# Patient Record
Sex: Female | Born: 1970 | Race: Black or African American | Hispanic: No | Marital: Single | Smoking: Never smoker
Health system: Southern US, Community
[De-identification: ages and names within clinical notes are randomized; demographics above are authoritative.]

## PROBLEM LIST (undated history)

## (undated) DIAGNOSIS — Z923 Personal history of irradiation: Secondary | ICD-10-CM

## (undated) DIAGNOSIS — J189 Pneumonia, unspecified organism: Secondary | ICD-10-CM

## (undated) DIAGNOSIS — Z9221 Personal history of antineoplastic chemotherapy: Secondary | ICD-10-CM

## (undated) DIAGNOSIS — C50919 Malignant neoplasm of unspecified site of unspecified female breast: Secondary | ICD-10-CM

## (undated) DIAGNOSIS — F419 Anxiety disorder, unspecified: Secondary | ICD-10-CM

## (undated) HISTORY — DX: Malignant neoplasm of unspecified site of unspecified female breast: C50.919

## (undated) HISTORY — PX: TUBAL LIGATION: SHX77

## (undated) HISTORY — PX: CHOLECYSTECTOMY: SHX55

---

## 2002-04-26 ENCOUNTER — Emergency Department (HOSPITAL_COMMUNITY): Admission: EM | Admit: 2002-04-26 | Discharge: 2002-04-26 | Payer: Self-pay | Admitting: Emergency Medicine

## 2002-04-30 ENCOUNTER — Emergency Department (HOSPITAL_COMMUNITY): Admission: EM | Admit: 2002-04-30 | Discharge: 2002-04-30 | Payer: Self-pay | Admitting: Emergency Medicine

## 2002-11-20 ENCOUNTER — Emergency Department (HOSPITAL_COMMUNITY): Admission: EM | Admit: 2002-11-20 | Discharge: 2002-11-20 | Payer: Self-pay | Admitting: Emergency Medicine

## 2003-07-28 ENCOUNTER — Other Ambulatory Visit: Admission: RE | Admit: 2003-07-28 | Discharge: 2003-07-28 | Payer: Self-pay | Admitting: Family Medicine

## 2012-04-13 ENCOUNTER — Encounter (HOSPITAL_BASED_OUTPATIENT_CLINIC_OR_DEPARTMENT_OTHER): Payer: Self-pay | Admitting: *Deleted

## 2012-04-13 ENCOUNTER — Emergency Department (HOSPITAL_BASED_OUTPATIENT_CLINIC_OR_DEPARTMENT_OTHER)
Admission: EM | Admit: 2012-04-13 | Discharge: 2012-04-13 | Disposition: A | Discharge status: Home / Self Care | Payer: 59 | Attending: Emergency Medicine | Admitting: Emergency Medicine

## 2012-04-13 ENCOUNTER — Emergency Department (HOSPITAL_BASED_OUTPATIENT_CLINIC_OR_DEPARTMENT_OTHER): Payer: 59

## 2012-04-13 DIAGNOSIS — Y9389 Activity, other specified: Secondary | ICD-10-CM | POA: Insufficient documentation

## 2012-04-13 DIAGNOSIS — S6990XA Unspecified injury of unspecified wrist, hand and finger(s), initial encounter: Secondary | ICD-10-CM | POA: Insufficient documentation

## 2012-04-13 DIAGNOSIS — S59909A Unspecified injury of unspecified elbow, initial encounter: Secondary | ICD-10-CM | POA: Insufficient documentation

## 2012-04-13 DIAGNOSIS — S40022A Contusion of left upper arm, initial encounter: Secondary | ICD-10-CM

## 2012-04-13 DIAGNOSIS — Y92410 Unspecified street and highway as the place of occurrence of the external cause: Secondary | ICD-10-CM | POA: Insufficient documentation

## 2012-04-13 DIAGNOSIS — S5010XA Contusion of unspecified forearm, initial encounter: Secondary | ICD-10-CM | POA: Insufficient documentation

## 2012-04-13 MED ORDER — IBUPROFEN 600 MG PO TABS: 600.0000 mg | ORAL_TABLET | Freq: Four times a day (QID) | ORAL | Status: DC | PRN

## 2012-04-13 MED ORDER — METHOCARBAMOL 500 MG PO TABS: 500.0000 mg | ORAL_TABLET | Freq: Two times a day (BID) | ORAL | Status: DC

## 2012-04-13 MED ORDER — TRAMADOL HCL 50 MG PO TABS: 50.0000 mg | ORAL_TABLET | Freq: Four times a day (QID) | ORAL | Status: DC | PRN
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
## 2012-04-26 ENCOUNTER — Encounter (HOSPITAL_BASED_OUTPATIENT_CLINIC_OR_DEPARTMENT_OTHER): Payer: Self-pay

## 2012-04-26 ENCOUNTER — Emergency Department (HOSPITAL_BASED_OUTPATIENT_CLINIC_OR_DEPARTMENT_OTHER)
Admission: EM | Admit: 2012-04-26 | Discharge: 2012-04-26 | Disposition: A | Discharge status: Home / Self Care | Payer: 59 | Attending: Emergency Medicine | Admitting: Emergency Medicine

## 2012-04-26 ENCOUNTER — Emergency Department (HOSPITAL_BASED_OUTPATIENT_CLINIC_OR_DEPARTMENT_OTHER): Payer: 59

## 2012-04-26 DIAGNOSIS — Z79899 Other long term (current) drug therapy: Secondary | ICD-10-CM | POA: Insufficient documentation

## 2012-04-26 DIAGNOSIS — S6990XA Unspecified injury of unspecified wrist, hand and finger(s), initial encounter: Secondary | ICD-10-CM | POA: Insufficient documentation

## 2012-04-26 DIAGNOSIS — Y9289 Other specified places as the place of occurrence of the external cause: Secondary | ICD-10-CM | POA: Insufficient documentation

## 2012-04-26 DIAGNOSIS — M25522 Pain in left elbow: Secondary | ICD-10-CM

## 2012-04-26 DIAGNOSIS — Y9389 Activity, other specified: Secondary | ICD-10-CM | POA: Insufficient documentation

## 2012-04-26 DIAGNOSIS — S59909A Unspecified injury of unspecified elbow, initial encounter: Secondary | ICD-10-CM | POA: Insufficient documentation

## 2012-04-26 MED ORDER — HYDROCODONE-ACETAMINOPHEN 5-325 MG PO TABS: 1.0000 | ORAL_TABLET | ORAL | Status: DC | PRN
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
## 2014-03-11 DIAGNOSIS — C50919 Malignant neoplasm of unspecified site of unspecified female breast: Secondary | ICD-10-CM

## 2014-03-11 HISTORY — DX: Malignant neoplasm of unspecified site of unspecified female breast: C50.919

## 2014-08-23 ENCOUNTER — Telehealth (HOSPITAL_BASED_OUTPATIENT_CLINIC_OR_DEPARTMENT_OTHER): Payer: Self-pay | Admitting: Emergency Medicine

## 2014-08-23 ENCOUNTER — Emergency Department (HOSPITAL_BASED_OUTPATIENT_CLINIC_OR_DEPARTMENT_OTHER)
Admission: EM | Admit: 2014-08-23 | Discharge: 2014-08-23 | Disposition: A | Discharge status: Home / Self Care | Payer: 59 | Attending: Emergency Medicine | Admitting: Emergency Medicine

## 2014-08-23 ENCOUNTER — Encounter (HOSPITAL_BASED_OUTPATIENT_CLINIC_OR_DEPARTMENT_OTHER): Payer: Self-pay | Admitting: *Deleted

## 2014-08-23 ENCOUNTER — Other Ambulatory Visit: Payer: Self-pay

## 2014-08-23 DIAGNOSIS — N63 Unspecified lump in breast: Secondary | ICD-10-CM | POA: Insufficient documentation

## 2014-08-23 DIAGNOSIS — N644 Mastodynia: Secondary | ICD-10-CM | POA: Diagnosis present

## 2014-08-23 DIAGNOSIS — N6310 Unspecified lump in the right breast, unspecified quadrant: Secondary | ICD-10-CM

## 2014-08-23 NOTE — Discharge Instructions (Signed)
Follow up at the breast center of [facility] as soon as possible. Follow up with the wellness clinic to establish care with a primary care doctor or one of the resources attached.  Breast Scan Breast scan is procedure done to examine dense breast tissue, which is difficult in a normal mammogram. It is used in women with breast lesions from fibrocystic disease, fibroadenoma, and fat necrosis. It also is used to determine the course of treatment for breast cancer. LET [facility] CARE PROVIDER KNOW ABOUT:  Any allergies you have.  All medicines you are taking, including vitamins, herbs, eye drops, creams, and over-the-counter medicines.  Previous problems you or members of your family have had with the use of anesthetics.  Any blood disorders you have.  Previous surgeries you have had.  Medical conditions you have.  Pregnancy or the possibility that you may be pregnant. RISKS AND COMPLICATIONS Generally, this is a safe procedure. However, as with any procedure, complications can occur. Possible complications include:   Slight discomfort from injection of radioactive substance.  Allergic reaction to contrast or radioactive substance used in exam. BEFORE THE PROCEDURE No fasting or sedation is required. PROCEDURE   You will be asked to remove all jewelry and clothing from the waist up.  An IV tube will be inserted in your arm or hand opposite the side of the breast to be examined. If both breasts are being evaluated, the IV tube may be inserted into a vein in the foot.  You will be positioned face down on a table. The breast to be imaged will be placed through an opening in the table.  The radioactive agent will be injected into the IV tube. You may experience a slight metallic taste after the injection.  Imaging will begin a few minutes after the injection. A scanner will be placed over the breast and will record the radiation given off.  You may also be asked to get into  different positions during the scan.  When the scan is complete, the IV tube is removed. AFTER THE PROCEDURE  You will be asked to get up slowly from the scanner to avoid light-headedness from lying flat during the procedure.  Drink plenty of fluids to help flush the remaining radioactive agent from your body. Document Released: 03/22/2004 Document Revised: 03/02/2013 Document Reviewed: 11/02/2012 [facility] Patient Information 2015 [place], [place]. This information is not intended to replace advice given to you by your health care provider. Make sure you discuss any questions you have with your health care provider.

## 2014-08-23 NOTE — ED Provider Notes (Signed)
CSN: [id number]     Arrival date & time 08/23/14  0020 History   None    Chief Complaint  Patient presents with  . Breast Pain     (Consider location/radiation/quality/duration/timing/severity/associated sxs/prior Treatment) HPI Comments: 44 year old female complaining of right breast pain. States she was laying in bed this evening and noticed that her right breast felt heavier than normal, and when she felt her breast, she felt something hard as if they were a "knot". The heaviness increased when she tried to lay on her right side. It is not very tender to touch. Denies any drainage or nipple drainage. Last menstrual period ended about 3 days ago. No fevers.  The history is provided by the patient.    History reviewed. No pertinent past medical history. Past Surgical History  Procedure Laterality Date  . Cholecystectomy    . Tubal ligation     History reviewed. No pertinent family history. History  Substance Use Topics  . Smoking status: Never Smoker   . Smokeless tobacco: Not on file  . Alcohol Use: No   OB History    No data available     Review of Systems  Genitourinary:       + R breast heaviness.      Allergies  Review of patient's allergies indicates no known allergies.  Home Medications   Prior to Admission medications   Not on File   BP 140/80 mmHg  Pulse 85  Temp(Src) 98.3 F (36.8 C) (Oral)  Resp 16  Ht 5\' 7"  (1.702 m)  Wt 175 lb (79.379 kg)  BMI 27.40 kg/m2  SpO2 100%  LMP 08/16/2014 Physical Exam  Constitutional: She is oriented to person, place, and time. She appears well-developed and well-nourished. No distress.  HENT:  Head: Normocephalic and atraumatic.  Mouth/Throat: Oropharynx is clear and moist.  Eyes: Conjunctivae and EOM are normal.  Neck: Normal range of motion. Neck supple.  Cardiovascular: Normal rate, regular rhythm and normal heart sounds.   Pulmonary/Chest: Effort normal and breath sounds normal. No respiratory distress.   Genitourinary:  R breast- 5 cm x 3 cm palpable, firm mass below areola. Non-tender. No fluctuance or induration. No erythema or warmth. No nipple drainage.  Musculoskeletal: Normal range of motion. She exhibits no edema.  Neurological: She is alert and oriented to person, place, and time. No sensory deficit.  Skin: Skin is warm and dry.  Psychiatric: She has a normal mood and affect. Her behavior is normal.  Nursing note and vitals reviewed.   ED Course  Procedures (including critical care time) Labs Review Labs Reviewed - No data to display  Imaging Review No results found.   EKG Interpretation None      MDM   Final diagnoses:  Breast mass, right   Non-toxic appearing, NAD. AFVSS. Palpable mass noted, non-tender. No fluctuance, induration, erythema or warmth concerning for abscess. The pt will need a breast US/mammogram. Refer to breast center. Resources given for PCP f/u. Stable for d/c. Return precautions given. Patient states understanding of treatment care plan and is agreeable.  Carman Ching, PA-C 08/23/14 0038  Shanon Rosser, MD 08/28/14 [phone]

## 2014-08-23 NOTE — ED Notes (Signed)
PA at bedside.

## 2014-08-23 NOTE — ED Notes (Signed)
Called [facility] and inquired about referral for pt.  Pt requires orders for diagnostic ultrasound/mammogram and requires PCP to send results.   Spoke to Dr. Ralene Bathe regarding information.  She related that Pt needs to call her insurance company, find PCP available to follow up with for ED visit.  Pt can schedule her own routine mammogram without an order and follow up with new PCP with results for further testing as needed.  Called pt and informed her to Call her insurance company to find available providers and call for ED follow up.  Also gave pt information on [facility] and Physician Referral Service numbers.

## 2014-08-23 NOTE — ED Notes (Signed)
Pt c/o right breast pain x 1 day pt reports "feels at knot"

## 2014-08-24 ENCOUNTER — Other Ambulatory Visit: Payer: Self-pay | Admitting: Family Medicine

## 2014-08-24 DIAGNOSIS — N63 Unspecified lump in unspecified breast: Secondary | ICD-10-CM

## 2014-08-24 DIAGNOSIS — N6310 Unspecified lump in the right breast, unspecified quadrant: Secondary | ICD-10-CM

## 2014-08-29 ENCOUNTER — Other Ambulatory Visit: Payer: Self-pay | Admitting: Family Medicine

## 2014-08-29 ENCOUNTER — Ambulatory Visit
Admission: RE | Admit: 2014-08-29 | Discharge: 2014-08-29 | Disposition: A | Discharge status: Home / Self Care | Payer: 59 | Source: Ambulatory Visit | Attending: Family Medicine | Admitting: Family Medicine

## 2014-08-29 DIAGNOSIS — N6310 Unspecified lump in the right breast, unspecified quadrant: Secondary | ICD-10-CM

## 2014-08-29 DIAGNOSIS — N63 Unspecified lump in unspecified breast: Secondary | ICD-10-CM

## 2014-08-29 DIAGNOSIS — R2231 Localized swelling, mass and lump, right upper limb: Secondary | ICD-10-CM

## 2014-08-30 ENCOUNTER — Other Ambulatory Visit: Payer: Self-pay | Admitting: Family Medicine

## 2014-08-30 DIAGNOSIS — R2231 Localized swelling, mass and lump, right upper limb: Secondary | ICD-10-CM

## 2014-08-31 ENCOUNTER — Other Ambulatory Visit: Payer: 59

## 2014-08-31 ENCOUNTER — Ambulatory Visit
Admission: RE | Admit: 2014-08-31 | Discharge: 2014-08-31 | Disposition: A | Discharge status: Home / Self Care | Payer: 59 | Source: Ambulatory Visit | Attending: Family Medicine | Admitting: Family Medicine

## 2014-08-31 DIAGNOSIS — N6310 Unspecified lump in the right breast, unspecified quadrant: Secondary | ICD-10-CM

## 2014-08-31 DIAGNOSIS — R2231 Localized swelling, mass and lump, right upper limb: Secondary | ICD-10-CM

## 2014-09-02 ENCOUNTER — Telehealth: Payer: Self-pay | Admitting: *Deleted

## 2014-09-02 DIAGNOSIS — C50311 Malignant neoplasm of lower-inner quadrant of right female breast: Secondary | ICD-10-CM | POA: Insufficient documentation

## 2014-09-02 NOTE — Telephone Encounter (Signed)
Received call back from patient. Confirmed [facility] for 09/07/14 at 8am .  Instructions and contact information given.

## 2014-09-02 NOTE — Telephone Encounter (Signed)
Left message for a return phone to schedule for [facility] 09/07/14.

## 2014-09-07 ENCOUNTER — Ambulatory Visit: Payer: 59

## 2014-09-07 ENCOUNTER — Telehealth: Payer: Self-pay | Admitting: Hematology and Oncology

## 2014-09-07 ENCOUNTER — Other Ambulatory Visit: Payer: Self-pay | Admitting: Surgery

## 2014-09-07 ENCOUNTER — Encounter: Payer: Self-pay | Admitting: *Deleted

## 2014-09-07 ENCOUNTER — Ambulatory Visit (HOSPITAL_BASED_OUTPATIENT_CLINIC_OR_DEPARTMENT_OTHER): Payer: 59 | Admitting: Hematology and Oncology

## 2014-09-07 ENCOUNTER — Other Ambulatory Visit (HOSPITAL_BASED_OUTPATIENT_CLINIC_OR_DEPARTMENT_OTHER): Payer: 59

## 2014-09-07 ENCOUNTER — Encounter: Payer: Self-pay | Admitting: Physical Therapy

## 2014-09-07 ENCOUNTER — Encounter: Payer: Self-pay | Admitting: Skilled Nursing Facility1

## 2014-09-07 ENCOUNTER — Encounter: Payer: Self-pay | Admitting: Hematology and Oncology

## 2014-09-07 ENCOUNTER — Ambulatory Visit
Admission: RE | Admit: 2014-09-07 | Discharge: 2014-09-07 | Disposition: A | Discharge status: Home / Self Care | Payer: 59 | Source: Ambulatory Visit | Attending: Radiation Oncology | Admitting: Radiation Oncology

## 2014-09-07 ENCOUNTER — Ambulatory Visit: Payer: 59 | Attending: Surgery | Admitting: Physical Therapy

## 2014-09-07 VITALS — BP 132/80 | HR 98 | Temp 98.2°F | Resp 18 | Ht 67.0 in | Wt 181.1 lb

## 2014-09-07 DIAGNOSIS — C773 Secondary and unspecified malignant neoplasm of axilla and upper limb lymph nodes: Secondary | ICD-10-CM | POA: Diagnosis not present

## 2014-09-07 DIAGNOSIS — C50311 Malignant neoplasm of lower-inner quadrant of right female breast: Secondary | ICD-10-CM

## 2014-09-07 DIAGNOSIS — C50911 Malignant neoplasm of unspecified site of right female breast: Secondary | ICD-10-CM | POA: Insufficient documentation

## 2014-09-07 DIAGNOSIS — Z171 Estrogen receptor negative status [ER-]: Secondary | ICD-10-CM

## 2014-09-07 LAB — COMPREHENSIVE METABOLIC PANEL (CC13)
ALT: 15 U/L (ref 0–55)
ANION GAP: 6 meq/L (ref 3–11)
AST: 22 U/L (ref 5–34)
Albumin: 3.3 g/dL — ABNORMAL LOW (ref 3.5–5.0)
Alkaline Phosphatase: 65 U/L (ref 40–150)
BILIRUBIN TOTAL: 0.35 mg/dL (ref 0.20–1.20)
BUN: 8.6 mg/dL (ref 7.0–26.0)
CALCIUM: 9 mg/dL (ref 8.4–10.4)
CHLORIDE: 108 meq/L (ref 98–109)
CO2: 26 mEq/L (ref 22–29)
CREATININE: 0.8 mg/dL (ref 0.6–1.1)
EGFR: >90 mL/min/{1.73_m2} (ref >90)
Glucose: 121 mg/dl (ref 70–140)
Potassium: 3.9 mEq/L (ref 3.5–5.1)
Sodium: 140 mEq/L (ref 136–145)
Total Protein: 7.9 g/dL (ref 6.4–8.3)

## 2014-09-07 LAB — CBC WITH DIFFERENTIAL/PLATELET
BASO%: 0.2 % (ref 0.0–2.0)
BASOS ABS: 0 10*3/uL (ref 0.0–0.1)
EOS%: 1.5 % (ref 0.0–7.0)
Eosinophils Absolute: 0.1 10*3/uL (ref 0.0–0.5)
HEMATOCRIT: 33.9 % — AB (ref 34.8–46.6)
HGB: 11.1 g/dL — ABNORMAL LOW (ref 11.6–15.9)
LYMPH%: 30.6 % (ref 14.0–49.7)
MCH: 28 pg (ref 25.1–34.0)
MCHC: 32.7 g/dL (ref 31.5–36.0)
MCV: 85.6 fL (ref 79.5–101.0)
MONO#: 0.5 10*3/uL (ref 0.1–0.9)
MONO%: 8.3 % (ref 0.0–14.0)
NEUT#: 3.7 10*3/uL (ref 1.5–6.5)
NEUT%: 59.4 % (ref 38.4–76.8)
PLATELETS: 296 10*3/uL (ref 145–400)
RBC: 3.96 10*6/uL (ref 3.70–5.45)
RDW: 14.1 % (ref 11.2–14.5)
WBC: 6.2 10*3/uL (ref 3.9–10.3)
lymph#: 1.9 10*3/uL (ref 0.9–3.3)

## 2014-09-07 MED ORDER — LIDOCAINE-PRILOCAINE 2.5-2.5 % EX CREA: TOPICAL_CREAM | CUTANEOUS | Status: DC

## 2014-09-07 MED ORDER — ONDANSETRON HCL 8 MG PO TABS: 8.0000 mg | ORAL_TABLET | Freq: Two times a day (BID) | ORAL | Status: DC | PRN

## 2014-09-07 MED ORDER — DEXAMETHASONE 4 MG PO TABS: 4.0000 mg | ORAL_TABLET | Freq: Two times a day (BID) | ORAL | Status: DC

## 2014-09-07 MED ORDER — LORAZEPAM 0.5 MG PO TABS: 0.5000 mg | ORAL_TABLET | Freq: Every day | ORAL | Status: DC

## 2014-09-07 MED ORDER — PROCHLORPERAZINE MALEATE 10 MG PO TABS: 10.0000 mg | ORAL_TABLET | Freq: Four times a day (QID) | ORAL | Status: DC | PRN

## 2014-09-07 NOTE — Progress Notes (Signed)
Subjective:     Patient ID: Brandy Douglas, female   DOB: December 22, 1970, 44 y.o.   MRN: [id number]  HPI   Review of Systems     Objective:   Physical Exam For the patient to understand and be given the tools to implement a healthy plant based diet during their cancer diagnosis.     Assessment:     Patient was seen today and found to be pleasant and was alone for her appointment. Pts right breast is affected. Pts ht 5'7'', BMI 28.4, and 181 pounds. Pts HGB 11.1, albumin 3.3, and HCT 33.9. Pts right breast is affected. Pt states she walks a lot at work but does not conduct physical activity beyond that.       Plan:     Dietitian educated the patient on implementing a plant based diet by incorporating more plant proteins, fruits, and vegetables. As a part of a healthy routine physical activity was discussed. A folder of evidence based information with a focus on a plant based diet and general nutrition during cancer was given to the patient.  The importance of legitimate, evidence based information was discussed and examples were given. As a part of the continuum of care the cancer dietitian's contact information was given to the patient in the event they would like to have a follow up appointment.

## 2014-09-07 NOTE — Progress Notes (Signed)
MD note created during office visit copy to patient.  Sent to scan.

## 2014-09-07 NOTE — Progress Notes (Signed)
[place] NOTE  Patient Care Team: Elisabeth Cara, PA-C as PCP - General (Family Medicine) [name], MD as Consulting Physician (General Surgery) Nicholas Lose, MD as Consulting Physician (Hematology and Oncology) Eppie Gibson, MD as Attending Physician (Radiation Oncology) Rockwell Germany, RN as Registered Nurse Mauro Kaufmann, RN as Registered Nurse  CHIEF COMPLAINTS/PURPOSE OF CONSULTATION:  Newly diagnosed breast cancer  HISTORY OF PRESENTING ILLNESS:  Brandy Douglas 44 y.o. female is here because of recent diagnosis of right breast cancer. Patient felt bilateral lumps for a week and then went to emergency room who then referred her to primary care physician who referred her to the breast center. On mammogram she had a 5 cm mass at 5 to 6:00 position along with that she had an ultrasound on 08/31/2014 that revealed in addition to the mass and right axillary lymph nodes were also noticed. There were at least 6 lymph nodes; the level III position were noted. 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 This is a same in both the primary tumor and the lymph node. She was presented this morning in the multidisciplinary tumor board and she is here today to discuss a treatment plan at the [facility] clinic.  I reviewed her records extensively and collaborated the history with the patient.  SUMMARY OF ONCOLOGIC HISTORY:   Breast cancer of lower-inner quadrant of right female breast   08/29/2014 Mammogram Right breast mass 5 x 4.8 x 4.2 cm, 6 enlarged axillary lymph nodes including nodes in level III location, T2 N3 M0 stage IIIc clinical stage   08/31/2014 Initial Diagnosis Right breast biopsy 6:00: Invasive ductal carcinoma, right axillary lymph node biopsy high-grade carcinoma ER 0% PR 0% HER-2 negative ratio 1.68, Ki-67 80%, grade 3   MEDICAL HISTORY:  Past Medical History  Diagnosis Date  .  Breast cancer     SURGICAL HISTORY: Past Surgical History  Procedure Laterality Date  . Cholecystectomy    . Tubal ligation      SOCIAL HISTORY: History   Social History  . Marital Status: Single    Spouse Name: N/A  . Number of Children: N/A  . Years of Education: N/A   Occupational History  . Not on file.   Social History Main Topics  . Smoking status: Never Smoker   . Smokeless tobacco: Not on file  . Alcohol Use: No  . Drug Use: No  . Sexual Activity: Yes    Birth Control/ Protection: Surgical   Other Topics Concern  . Not on file   Social History Narrative    FAMILY HISTORY: Family History  Problem Relation Age of Onset  . Breast cancer Paternal Grandmother     ALLERGIES:  has No Known Allergies.  MEDICATIONS:  Current Outpatient Prescriptions  Medication Sig Dispense Refill  . naproxen sodium (ANAPROX) 220 MG tablet Take 440 mg by mouth 3 (three) times a week.     No current facility-administered medications for this visit.    REVIEW OF SYSTEMS:   Constitutional: Denies fevers, chills or abnormal night sweats Eyes: Denies blurriness of vision, double vision or watery eyes Ears, nose, mouth, throat, and face: Denies mucositis or sore throat Respiratory: Denies cough, dyspnea or wheezes Cardiovascular: Denies palpitation, chest discomfort or lower extremity swelling Gastrointestinal:  Denies nausea, heartburn or change in bowel habits Skin: Denies abnormal skin rashes Lymphatics: Denies new lymphadenopathy or easy bruising  Neurological:Denies numbness, tingling or new weaknesses Behavioral/Psych: Mood is stable, no new changes  Breast: Palpable right breast mass All other systems were reviewed with the patient and are negative.  PHYSICAL EXAMINATION: ECOG PERFORMANCE STATUS: 1 - Symptomatic but completely ambulatory  Filed Vitals:   09/07/14 0836  BP: 132/80  Pulse: 98  Temp: 98.2 F (36.8 C)  Resp: 18   Filed Weights   09/07/14 0836   Weight: 181 lb 1.6 oz (82.146 kg)    GENERAL:alert, no distress and comfortable SKIN: skin color, texture, turgor are normal, no rashes or significant lesions EYES: normal, conjunctiva are pink and non-injected, sclera clear OROPHARYNX:no exudate, no erythema and lips, buccal mucosa, and tongue normal  NECK: supple, thyroid normal size, non-tender, without nodularity LYMPH:  no palpable lymphadenopathy in the cervical, axillary or inguinal LUNGS: clear to auscultation and percussion with normal breathing effort HEART: regular rate & rhythm and no murmurs and no lower extremity edema ABDOMEN:abdomen soft, non-tender and normal bowel sounds Musculoskeletal:no cyanosis of digits and no clubbing  PSYCH: alert & oriented x 3 with fluent speech NEURO: no focal motor/sensory deficits BREAST:Large palpable right breast mass. No palpable axillary or supraclavicular lymphadenopathy (exam performed in the presence of a chaperone)   LABORATORY DATA:  I have reviewed the data as listed Lab Results  Component Value Date   WBC 6.2 09/07/2014   HGB 11.1* 09/07/2014   HCT 33.9* 09/07/2014   MCV 85.6 09/07/2014   PLT 296 09/07/2014   Lab Results  Component Value Date   NA 140 09/07/2014   K 3.9 09/07/2014   CO2 26 09/07/2014    RADIOGRAPHIC STUDIES: I have personally reviewed the radiological reports and agreed with the findings in the report. Results summarized as above  ASSESSMENT AND PLAN:  Breast cancer of lower-inner quadrant of right female breast Right breast mass 5 x 4.8 x 4.2 cm, 6 enlarged axillary lymph nodes including nodes in level III location, T2 N3 M0 stage IIIc clinical stage Right breast biopsy 6:00: Invasive ductal carcinoma, right axillary lymph node biopsy high-grade carcinoma ER 0% PR 0% HER-2 negative ratio 1.68, Ki-67 80%, grade 3  Pathology and radiology review:Discussed with the patient, the details of pathology including the type of breast cancer,the clinical  staging, the significance of ER, PR and HER-2/neu receptors and the implications for treatment. After reviewing the pathology in detail, we proceeded to discuss the different treatment options between surgery, radiation, chemotherapy.  Recommendation: 1. Neo-adjuvant chemotherapy with dose dense Adriamycin and Cytoxan 4 followed by weekly Abraxane and carboplatin 12 2. Followed by surgery 3. Followed by radiation therapy  Plan: 1. Genetics consult 2. MRI of the breast 3. Echocardiogram 4. Port placement 5. Chemotherapy class 6. CT chest abdomen pelvis and bone scan for staging  Chemotherapy counseling:I have discussed the risks and benefits of chemotherapy including the risks of nausea/ vomiting, risk of infection from low WBC count, fatigue due to chemo or anemia, bruising or bleeding due to low platelets, mouth sores, loss/ change in taste and decreased appetite. Liver and kidney function will be monitored through out chemotherapy as abnormalities in liver and kidney function may be a side effect of treatment. Cardiac dysfunction due to Adriamycin was discussed in detail. Risk of permanent bone marrow dysfunction and leukemia due to chemo were also discussed.   Return to clinic with the start of cycle 1 chemotherapy hopefully within the next 2 weeks   All questions were answered. The patient knows to call the  clinic with any problems, questions or concerns.    Rulon Eisenmenger, MD 10:38 AM

## 2014-09-07 NOTE — Progress Notes (Signed)
I chckd in new patient with no issues prior to seeing the dr. She has not traveled and I gave her breast care packet

## 2014-09-07 NOTE — Progress Notes (Signed)
Radiation Oncology         [phone] ________________________________  Initial outpatient Consultation  Name: Brandy Douglas MRN: [id number]  Date: 09/07/2014  DOB: December 12, 1970  [id number], Bennetta Laos, PA-C  [name], MD   REFERRING PHYSICIAN: [name], MD  DIAGNOSIS:    ICD-9-CM ICD-10-CM   1. 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 female who presented with bilateral palpable masses in her breasts. She underwent diagnostic bilateral mammograms on 08/29/14. The left breast was unremarkable but a 5.0 cm mass was appreciated at the 5 o'clock position of the right breast. At least 6 enlarged axillary lymph nodes were identified. This included nodes in the level 3 axillary location. Biopsy of the right breast mass and right axilla revealed high grade carcinoma in both specimens. Her disease is triple negative.  Discussed radiotherapy benefits and potential side effects. She lives in [place]. She works in Press [employer]. Non smoker She is not currently taking hormonal birth control. She has never been on hormone replacement therapy.    PREVIOUS RADIATION THERAPY: No  PAST MEDICAL HISTORY:  has a past medical history of Breast cancer.    PAST SURGICAL HISTORY: Past Surgical History  Procedure Laterality Date  . Cholecystectomy    . Tubal ligation      FAMILY HISTORY: family history includes Breast cancer in her paternal grandmother.  SOCIAL HISTORY:  reports that she has never smoked. She does not have any smokeless tobacco history on file. She reports that she does not drink alcohol or use illicit drugs.  ALLERGIES: Review of patient's allergies indicates no known allergies.  MEDICATIONS:  Current Outpatient Prescriptions  Medication Sig Dispense Refill  . naproxen sodium (ANAPROX)  220 MG tablet Take 440 mg by mouth 3 (three) times a week.     No current facility-administered medications for this encounter.    REVIEW OF SYSTEMS:  Notable for that above.   PHYSICAL EXAM:  Vitals with BMI 09/07/2014  Height _0   Weight 181 lbs 2 oz  BMI 39.7  Systolic 673  Diastolic 80  Pulse 98  Respirations 18   General: Alert and oriented, in no acute distress HEENT: Head is normocephalic. Extraocular movements are intact. Oropharynx is clear. Neck: Neck is supple, no palpable cervical or supraclavicular lymphadenopathy. Heart: Regular in rate and rhythm with no murmurs, rubs, or gallops. Chest: Clear to auscultation bilaterally, with no rhonchi, wheezes, or rales. Abdomen: Soft, nontender, nondistended, with no rigidity or guarding. Extremities: No cyanosis or edema. Lymphatics: see Neck Exam Skin: No concerning lesions. Musculoskeletal: symmetric strength and muscle tone throughout. Neurologic: Cranial nerves II through XII are grossly intact. No obvious focalities. Speech is fluent. Coordination is intact. Psychiatric: Judgment and insight are intact. Affect is appropriate. Breast: In lower quadrants of the right breast there is a post biopsy mass 7 cm in greatest dimension. There is a firm mass in the right axilla consistent with lymphadenopathy. No palpable masses in the left breast or axilla.   ECOG = 0  0 - Asymptomatic (Fully active, able to carry on all predisease activities without restriction)  1 - Symptomatic but completely ambulatory (Restricted in physically strenuous activity but ambulatory and able to carry out work of a light or sedentary nature. For example, light housework, office work)  2 - Symptomatic, <50% in  bed during the day (Ambulatory and capable of all self care but unable to carry out any work activities. Up and about more than 50% of waking hours)  3 - Symptomatic, >50% in bed, but not bedbound (Capable of only limited self-care, confined  to bed or chair 50% or more of waking hours)  4 - Bedbound (Completely disabled. Cannot carry on any self-care. Totally confined to bed or chair)  5 - Death   Eustace Pen MM, Creech RH, Tormey DC, et al. [phone]). "Toxicity and response criteria of the [facility]". [place] Oncol. 5 (6): 649-55   LABORATORY DATA:  Lab Results  Component Value Date   WBC 6.2 09/07/2014   HGB 11.1* 09/07/2014   HCT 33.9* 09/07/2014   MCV 85.6 09/07/2014   PLT 296 09/07/2014   CMP     Component Value Date/Time   NA 140 09/07/2014 0826   K 3.9 09/07/2014 0826   CO2 26 09/07/2014 0826   GLUCOSE 121 09/07/2014 0826   BUN 8.6 09/07/2014 0826   CREATININE 0.8 09/07/2014 0826   CALCIUM 9.0 09/07/2014 0826   PROT 7.9 09/07/2014 0826   ALBUMIN 3.3* 09/07/2014 0826   AST 22 09/07/2014 0826   ALT 15 09/07/2014 0826   ALKPHOS 65 09/07/2014 0826   BILITOT 0.35 09/07/2014 0826         RADIOGRAPHY: Mm Digital Diagnostic Unilat R  08/31/2014   CLINICAL DATA:  Post biopsy of a highly suspicious mass in the lower right breast at 5-6 o'clock as well as of an abnormal lymph node in the low right axilla.  EXAM: DIAGNOSTIC RIGHT MAMMOGRAM POST ULTRASOUND BIOPSY  COMPARISON:  Previous exam(s).  FINDINGS: Mammographic images were obtained following ultrasound guided biopsy of the suspicious mass in the right breast at 5-6 o'clock as well as ultrasound-guided biopsy of a suspicious lymph node in the low right axilla.  A ribbon shaped biopsy marking clip is present in the targeted region of the right breast mass. A spiral shaped lymph node is present within an abnormal lymph node in the low right axilla.  IMPRESSION: 1. Appropriate positioning of ribbon shaped biopsy marking clip post ultrasound-guided biopsy of a suspicious mass in the right breast at 5-6 o'clock.  2. Appropriate positioning of a a spiral shaped HydroMARK clip post ultrasound-guided biopsy of a suspicious lymph node in the low right  axilla.  Final Assessment: Post Procedure Mammograms for Marker Placement   Electronically Signed   By: Everlean Alstrom M.D.   On: 08/31/2014 10:21   US Breast Ltd Uni Left Inc Axilla  08/29/2014   CLINICAL DATA:  Bilateral breast lumps for 1 week.  EXAM: DIGITAL DIAGNOSTIC BILATERAL MAMMOGRAM WITH 3D TOMOSYNTHESIS WITH CAD  ULTRASOUND BILATERAL BREAST  COMPARISON:  Baseline exam  ACR Breast Density Category c: The breast tissue is heterogeneously dense, which may obscure small masses.  FINDINGS: There is a large circumscribed mass in the lower central portion of the right breast, marked with a BB as palpable. Enlarged lower right axillary lymph node is partially imaged.  Within the lower inner quadrant of the left breast, no mammographic abnormality is identified to correlate with the palpable abnormality.  Mammographic images were processed with CAD.  On physical exam, I palpate a firm mass in the lower central portion of the right breast. I palpate enlarged right axillary lymph nodes. I palpate no discrete mass in the lower inner quadrant of the left breast.  Targeted ultrasound is performed, showing  a circumscribed hypoechoic mass with anechoic and solid spaces. The solid spaces contain vascularity by Doppler evaluation. Mass measures 5.0 x 4.8 x 4.2 cm and is suspicious for a papillary neoplasm. Evaluation of the right axilla shows numerous abnormal lymph nodes, extending cephalad. At least 6 enlarged axillary lymph nodes are identified, including nodes in the level 3 location.  In the lower inner quadrant of the left breast, no sonographic abnormality identified.  IMPRESSION: 1. Suspicious mass in the 5 o'clock location of the right breast measuring at least 5.0 cm. 2. Numerous enlarged right axillary lymph node suspicious for metastases. 3. No suspicious findings in the left breast.  RECOMMENDATION: Ultrasound-guided core biopsy is recommended of right breast mass and enlarged right axillary lymph node.   I have discussed the findings and recommendations with the patient. Results were also provided in writing at the conclusion of the visit. If applicable, a reminder letter will be sent to the patient regarding the next appointment.  BI-RADS CATEGORY  5: Highly suggestive of malignancy.   Electronically Signed   By: Nolon Nations M.D.   On: 08/29/2014 15:33   US Breast Ltd Uni Right Inc Axilla  08/29/2014   CLINICAL DATA:  Bilateral breast lumps for 1 week.  EXAM: DIGITAL DIAGNOSTIC BILATERAL MAMMOGRAM WITH 3D TOMOSYNTHESIS WITH CAD  ULTRASOUND BILATERAL BREAST  COMPARISON:  Baseline exam  ACR Breast Density Category c: The breast tissue is heterogeneously dense, which may obscure small masses.  FINDINGS: There is a large circumscribed mass in the lower central portion of the right breast, marked with a BB as palpable. Enlarged lower right axillary lymph node is partially imaged.  Within the lower inner quadrant of the left breast, no mammographic abnormality is identified to correlate with the palpable abnormality.  Mammographic images were processed with CAD.  On physical exam, I palpate a firm mass in the lower central portion of the right breast. I palpate enlarged right axillary lymph nodes. I palpate no discrete mass in the lower inner quadrant of the left breast.  Targeted ultrasound is performed, showing a circumscribed hypoechoic mass with anechoic and solid spaces. The solid spaces contain vascularity by Doppler evaluation. Mass measures 5.0 x 4.8 x 4.2 cm and is suspicious for a papillary neoplasm. Evaluation of the right axilla shows numerous abnormal lymph nodes, extending cephalad. At least 6 enlarged axillary lymph nodes are identified, including nodes in the level 3 location.  In the lower inner quadrant of the left breast, no sonographic abnormality identified.  IMPRESSION: 1. Suspicious mass in the 5 o'clock location of the right breast measuring at least 5.0 cm. 2. Numerous enlarged right  axillary lymph node suspicious for metastases. 3. No suspicious findings in the left breast.  RECOMMENDATION: Ultrasound-guided core biopsy is recommended of right breast mass and enlarged right axillary lymph node.  I have discussed the findings and recommendations with the patient. Results were also provided in writing at the conclusion of the visit. If applicable, a reminder letter will be sent to the patient regarding the next appointment.  BI-RADS CATEGORY  5: Highly suggestive of malignancy.   Electronically Signed   By: Nolon Nations M.D.   On: 08/29/2014 15:33   Mm Diag Breast Tomo Bilateral  08/29/2014   CLINICAL DATA:  Bilateral breast lumps for 1 week.  EXAM: DIGITAL DIAGNOSTIC BILATERAL MAMMOGRAM WITH 3D TOMOSYNTHESIS WITH CAD  ULTRASOUND BILATERAL BREAST  COMPARISON:  Baseline exam  ACR Breast Density Category c: The breast tissue is heterogeneously dense,  which may obscure small masses.  FINDINGS: There is a large circumscribed mass in the lower central portion of the right breast, marked with a BB as palpable. Enlarged lower right axillary lymph node is partially imaged.  Within the lower inner quadrant of the left breast, no mammographic abnormality is identified to correlate with the palpable abnormality.  Mammographic images were processed with CAD.  On physical exam, I palpate a firm mass in the lower central portion of the right breast. I palpate enlarged right axillary lymph nodes. I palpate no discrete mass in the lower inner quadrant of the left breast.  Targeted ultrasound is performed, showing a circumscribed hypoechoic mass with anechoic and solid spaces. The solid spaces contain vascularity by Doppler evaluation. Mass measures 5.0 x 4.8 x 4.2 cm and is suspicious for a papillary neoplasm. Evaluation of the right axilla shows numerous abnormal lymph nodes, extending cephalad. At least 6 enlarged axillary lymph nodes are identified, including nodes in the level 3 location.  In the  lower inner quadrant of the left breast, no sonographic abnormality identified.  IMPRESSION: 1. Suspicious mass in the 5 o'clock location of the right breast measuring at least 5.0 cm. 2. Numerous enlarged right axillary lymph node suspicious for metastases. 3. No suspicious findings in the left breast.  RECOMMENDATION: Ultrasound-guided core biopsy is recommended of right breast mass and enlarged right axillary lymph node.  I have discussed the findings and recommendations with the patient. Results were also provided in writing at the conclusion of the visit. If applicable, a reminder letter will be sent to the patient regarding the next appointment.  BI-RADS CATEGORY  5: Highly suggestive of malignancy.   Electronically Signed   By: Nolon Nations M.D.   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 This was found to be concordant by Dr. Everlean Alstrom.  Pathology results were discussed with the patient via telephone. The patient reported tenderness at the biopsy sites. Post biopsy instructions were reviewed and questions were answered. The patient was encouraged to call [facility] with any additional questions and or concerns. The patient was referred to the [place]  Multi-disciplinary clinic on September 07, 2014.  Pathology results reported by Terie Purser RN on September 01, 2014.   Electronically Signed   By: Everlean Alstrom M.D.   On: 09/01/2014 12:56   09/02/2014   CLINICAL DATA:  44 year old female with a highly suspicious mass in the right breast at 5-6 o'clock as well as of suspicious lymph nodes in the low right axilla.  EXAM: ULTRASOUND GUIDED RIGHT BREAST CORE NEEDLE BIOPSY  COMPARISON:  Previous exam(s).  FINDINGS: I met with the patient  and we discussed the procedure of ultrasound-guided biopsy, including benefits and alternatives. We discussed the high likelihood of a successful procedure. We discussed the risks of the procedure, including infection, bleeding, tissue injury, clip migration, and inadequate sampling. Informed written consent was given. The usual time-out protocol was performed immediately prior to the procedure.  Using sterile technique and 2% Lidocaine as local anesthetic, under direct ultrasound visualization, a 12 gauge spring-loaded device was used to perform biopsy of the mass in the lower right breast at 6 o'clock using a lateral to medial approach.  At the conclusion of the procedure a ribbon shaped tissue marker clip was deployed into the biopsy cavity.  Using sterile technique and 2% Lidocaine as local anesthetic, under direct ultrasound visualization, a 12 gauge spring-loaded device was used to perform biopsy of an enlarged lymph node in the low right axilla using a lateral to medial approach. At the conclusion of the procedure a spiral shaped HydroMARK tissue marker clip was deployed into the biopsy cavity.  Follow up 2 view mammogram was performed and dictated separately.  IMPRESSION: 1. Ultrasound guided biopsy of the highly suspicious mass in the lower right breast at 5-6 o'clock.  2. Ultrasound-guided biopsy of a suspicious lymph node in the low right axilla.  Electronically Signed: By: Everlean Alstrom M.D. 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 This was found to be concordant by Dr. Everlean Alstrom.  Pathology results were discussed with the patient via telephone. The patient reported tenderness at the biopsy sites. Post biopsy instructions were reviewed and questions were  answered. The patient was encouraged to call [facility] with any additional questions and or concerns. The patient was referred to the [place]  Multi-disciplinary clinic on September 07, 2014.  Pathology results reported by Terie Purser RN on September 01, 2014.   Electronically Signed   By: Everlean Alstrom M.D.   On: 09/01/2014 12:56   09/02/2014   CLINICAL DATA:  44 year old female with a highly suspicious mass in the right breast at 5-6 o'clock as well as of suspicious lymph nodes in the low right axilla.  EXAM: ULTRASOUND GUIDED RIGHT BREAST CORE NEEDLE BIOPSY  COMPARISON:  Previous exam(s).  FINDINGS: I met with the patient and we discussed the procedure of ultrasound-guided biopsy, including benefits and alternatives. We discussed the high likelihood of a successful procedure. We discussed the risks of the procedure, including infection, bleeding, tissue injury, clip migration, and inadequate sampling. Informed written consent was given. The usual time-out protocol was performed immediately prior to the procedure.  Using sterile technique and 2% Lidocaine as local anesthetic, under direct ultrasound visualization, a 12 gauge spring-loaded device was used to perform biopsy of the mass in the lower right breast at 6 o'clock using a lateral to medial approach. At the conclusion of the procedure a ribbon shaped tissue marker clip was deployed into the biopsy cavity.  Using sterile technique and 2% Lidocaine as local anesthetic, under direct ultrasound visualization, a 12 gauge spring-loaded device was used to perform biopsy of an enlarged lymph node in the low right axilla using a lateral to medial approach. At the conclusion of the procedure a spiral shaped HydroMARK tissue marker clip was deployed into the biopsy cavity.  Follow up 2 view mammogram was performed and dictated separately.  IMPRESSION: 1. Ultrasound guided biopsy of the highly suspicious mass in the lower right  breast at 5-6 o'clock.  2. Ultrasound-guided biopsy of a suspicious lymph node in the low right axilla.  Electronically Signed: By: Everlean Alstrom M.D. On: 08/31/2014 09:50      IMPRESSION/PLAN:  Lovely 44 yo with triple negative Stage IIIC right breast cancer  Plan for genetics referral and breast MRI, CT staging, neo adjuvant chemotherapy, and goal of breast conserving  surgery, followed by RT.  It was a pleasure meeting the patient today. We discussed the risks, benefits, and side effects of radiotherapy. We discussed that radiation would take approximately 6-7 weeks to complete and that I would give the patient a few weeks to heal following surgery before starting treatment planning. We spoke about acute effects including skin irritation and fatigue as well as much less common late effects including lung irritation. We spoke about the latest technology that is used to minimize the risk of late effects for breast cancer patients undergoing radiotherapy. No guarantees of treatment were given. The patient is enthusiastic about proceeding with treatment. I look forward to participating in the patient's care.      This document serves as a record of services personally performed by Eppie Gibson, MD. It was created on her behalf by Arlyce Harman, a trained medical scribe. The creation of this record is based on the scribe's personal observations and the provider's statements to them. This document has been checked and approved by the attending provider.  __________________________________________   Eppie Gibson, MD

## 2014-09-07 NOTE — Therapy (Signed)
[place], [place], [address] Phone: [phone]   Fax:  [phone]  Physical Therapy Evaluation  Patient Details  Name: Brandy Douglas MRN: [id number] Date of Birth: [birth year] Referring Provider:  [name], MD  Encounter Date: 09/07/2014      PT End of Session - 09/07/14 1201    Visit Number 1   Number of Visits 1   PT Start Time 1105   PT Stop Time 6948   PT Time Calculation (min) 21 min   Activity Tolerance Patient tolerated treatment well   Behavior During Therapy [facility] for tasks assessed/performed      Past Medical History  Diagnosis Date  . Breast cancer     Past Surgical History  Procedure Laterality Date  . Cholecystectomy    . Tubal ligation      There were no vitals filed for this visit.  Visit Diagnosis:  Breast cancer metastasized to axillary lymph node, right - Plan: PT plan of care cert/re-cert      Subjective Assessment - 09/07/14 1151    Subjective Patient is being seen today for a baseline assessment of her newly diagnosed right breast cancer.   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  It is 5 cm in size located in the lower outer quadrant with positive axillary lymph nodes.   Patient Stated Goals Reduce lymphedema risk and learn post op shoulder ROM HEP   Currently in Pain? No/denies            [facility] PT Assessment - 09/07/14 0001    Assessment   Medical Diagnosis Right Triple negative breast cancer   Onset Date/Surgical Date 09/01/14   Hand Dominance Right   Prior Therapy no   Precautions   Precautions Other (comment)  Active breast cancer   Restrictions   Weight Bearing Restrictions No   Balance Screen   Has the patient fallen in the past 6 months No   Has the patient had a decrease in activity level because of a fear of falling?  No   Is the patient reluctant to leave their home because of a fear of  falling?  No   Home Ecologist residence   Living Arrangements Spouse/significant other  Boyfriend   Available Help at Discharge Family   Prior Function   Level of Independence Independent   Vocation Full time employment   Vocation Requirements In Public house manager at [employer] She does not exercise   Cognition   Overall Cognitive Status Within Functional Limits for tasks assessed   Posture/Postural Control   Posture/Postural Control No significant limitations   ROM / Strength   AROM / PROM / Strength AROM;Strength   AROM   AROM Assessment Site Shoulder   Right/Left Shoulder Right;Left   Right Shoulder Extension 51 Degrees   Right Shoulder Flexion 145 Degrees   Right Shoulder ABduction 145 Degrees   Right Shoulder Internal Rotation 52 Degrees   Right Shoulder External Rotation 81 Degrees   Left Shoulder Extension 48 Degrees   Left Shoulder Flexion 135 Degrees   Left Shoulder ABduction 152 Degrees   Left Shoulder Internal Rotation 47 Degrees   Left Shoulder External Rotation 74 Degrees   Strength   Overall Strength Within functional limits for tasks performed           LYMPHEDEMA/ONCOLOGY QUESTIONNAIRE - 09/07/14 1158    Type   Cancer  Type Right breast cancer   Lymphedema Assessments   Lymphedema Assessments Upper extremities   Right Upper Extremity Lymphedema   10 cm Proximal to Olecranon Process 30 cm   Olecranon Process 26.8 cm   10 cm Proximal to Ulnar Styloid Process 22.6 cm   Just Proximal to Ulnar Styloid Process 16.3 cm   Across Hand at [employer] 20.2 cm   At [place] of 2nd Digit 6.4 cm   Left Upper Extremity Lymphedema   10 cm Proximal to Olecranon Process 29.2 cm   Olecranon Process 26.4 cm   10 cm Proximal to Ulnar Styloid Process 21.5 cm   Just Proximal to Ulnar Styloid Process 16 cm   Across Hand at [employer] 19.4 cm   At [place] of 2nd Digit 6.2 cm                 Patient was  instructed today in a home exercise program today for post op shoulder range of motion. These included active assist shoulder flexion in sitting, scapular retraction, wall walking with shoulder abduction, and hands behind head external rotation.  She was encouraged to do these twice a day, holding 3 seconds and repeating 5 times when permitted by her physician.           PT Education - 09/07/14 1200    Education provided Yes   Education Details Post op shoulder ROM HEP and lymphedema risk reduction   Person(s) Educated Patient   Methods Explanation;Demonstration;Handout   Comprehension Verbalized understanding              Breast Clinic Goals - 09/07/14 1204    Patient will be able to verbalize understanding of pertinent lymphedema risk reduction practices relevant to her diagnosis specifically related to skin care.   Time 1   Period Days   Status Achieved   Patient will be able to return demonstrate and/or verbalize understanding of the post-op home exercise program related to regaining shoulder range of motion.   Time 1   Period Days   Status Achieved   Patient will be able to verbalize understanding of the importance of attending the postoperative After Breast Cancer Class for further lymphedema risk reduction education and therapeutic exercise.   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  It is 5 cm in size located in the lower outer quadrant with positive axillary lymph nodes.  She is planning to have neoadjuvant chemotherapy followed by a right mastectomy or lumpectomy with an axillary lymph node dissection.  She will benefit from post op PT to regain shoudler ROM and strength and reduce lymphedema risk.   Pt will benefit from skilled therapeutic intervention in order to improve on the following deficits Decreased range of  motion;Impaired UE functional use;Decreased knowledge of precautions;Pain;Decreased strength   Rehab Potential Good   Clinical Impairments Affecting Rehab Potential None   PT Frequency One time visit   PT Treatment/Interventions Patient/family education;Therapeutic exercise   Consulted and Agree with Plan of Care Patient     Patient will follow up at outpatient cancer rehab if needed following surgery.  If the patient requires physical therapy at that time, a specific plan will be dictated and sent to the referring physician for approval. The patient was educated today on appropriate  basic range of motion exercises to begin post operatively and the importance of attending the After Breast Cancer class following surgery.  Patient was educated today on lymphedema risk reduction practices as it pertains to recommendations that will benefit the patient immediately following surgery.  She verbalized good understanding.  No additional physical therapy is indicated at this time.       Problem List Patient Active Problem List   Diagnosis Date Noted  . Breast cancer of lower-inner quadrant of right female breast 09/02/2014    Annia Friendly, PT 09/07/2014, 12:06 PM  [place], [place], [address] Phone: [phone]   Fax:  [phone]

## 2014-09-07 NOTE — Telephone Encounter (Signed)
.  app

## 2014-09-07 NOTE — Assessment & Plan Note (Signed)
Right breast mass 5 x 4.8 x 4.2 cm, 6 enlarged axillary lymph nodes including nodes in level III location, T2 N3 M0 stage IIIc clinical stage Right breast biopsy 6:00: Invasive ductal carcinoma, right axillary lymph node biopsy high-grade carcinoma ER 0% PR 0% HER-2 negative ratio 1.68, Ki-67 80%, grade 3  Pathology and radiology review:Discussed with the patient, the details of pathology including the type of breast cancer,the clinical staging, the significance of ER, PR and HER-2/neu receptors and the implications for treatment. After reviewing the pathology in detail, we proceeded to discuss the different treatment options between surgery, radiation, chemotherapy.  Recommendation: 1. Neo-adjuvant chemotherapy with dose dense Adriamycin and Cytoxan 4 followed by weekly Abraxane and carboplatin 12 2. Followed by surgery 3. Followed by radiation therapy  Plan: 1. Genetics consult 2. MRI of the breast 3. Echocardiogram 4. Port placement 5. Chemotherapy class 6. CT chest abdomen pelvis and bone scan for staging  Chemotherapy counseling:I have discussed the risks and benefits of chemotherapy including the risks of nausea/ vomiting, risk of infection from low WBC count, fatigue due to chemo or anemia, bruising or bleeding due to low platelets, mouth sores, loss/ change in taste and decreased appetite. Liver and kidney function will be monitored through out chemotherapy as abnormalities in liver and kidney function may be a side effect of treatment. Cardiac dysfunction due to Adriamycin was discussed in detail. Risk of permanent bone marrow dysfunction and leukemia due to chemo were also discussed.   Return to clinic with the start of cycle 1 chemotherapy hopefully within the next 2 weeks

## 2014-09-07 NOTE — Patient Instructions (Signed)

## 2014-09-07 NOTE — Progress Notes (Signed)
Clinical Social Work [facility] Psychosocial Distress Screening [facility]  Patient completed distress screening protocol and scored a 3 on the Psychosocial Distress Thermometer which indicates mild distress. Clinical Social Worker met with patient in [facility] to assess for distress and other psychosocial needs. Patient stated that although she was feeling overwhelmed she felt "better" after meeting with the treatment team and getting information on her treatment plan. CSW and patient discussed common feeling and emotions when being diagnosed with cancer, and the importance of support during treatment. CSW informed patient of the support team and support services at [facility], and patient was agreeable to an alight guide referral. CSW provided contact information and encouraged patient to call with any questions or concerns.   [facility] DISTRESS SCREENING 09/07/2014  Screening Type Initial Screening  Distress experienced in past week (1-10) 3  Emotional problem type Adjusting to illness  Physician notified of physical symptoms Yes  Referral to clinical psychology No  Referral to clinical social work Yes  Referral to dietition No  Referral to financial advocate No  Referral to support programs Yes  Referral to palliative care No   Abigail Elmore, MSW, LCSW, OSW-C Clinical Social Worker [facility] [phone]       

## 2014-09-07 NOTE — Telephone Encounter (Signed)
Appointments made and avs printed for patient °

## 2014-09-08 ENCOUNTER — Other Ambulatory Visit: Payer: Self-pay | Admitting: *Deleted

## 2014-09-08 ENCOUNTER — Telehealth: Payer: Self-pay | Admitting: Hematology and Oncology

## 2014-09-08 ENCOUNTER — Telehealth: Payer: Self-pay | Admitting: Oncology

## 2014-09-08 NOTE — Telephone Encounter (Signed)
Called patient and she is aware of her new appointment times per pof she also states that it will be ok to stick with mondays and her next treatment has been moved   [employer]

## 2014-09-08 NOTE — Telephone Encounter (Signed)
Chemo appointments added per pof  anne

## 2014-09-09 ENCOUNTER — Other Ambulatory Visit: Payer: Self-pay

## 2014-09-09 ENCOUNTER — Telehealth: Payer: Self-pay | Admitting: Hematology and Oncology

## 2014-09-09 ENCOUNTER — Ambulatory Visit
Admission: RE | Admit: 2014-09-09 | Discharge: 2014-09-09 | Disposition: A | Discharge status: Home / Self Care | Payer: 59 | Source: Ambulatory Visit | Attending: Hematology and Oncology | Admitting: Hematology and Oncology

## 2014-09-09 DIAGNOSIS — C50311 Malignant neoplasm of lower-inner quadrant of right female breast: Secondary | ICD-10-CM

## 2014-09-09 MED ORDER — LIDOCAINE-PRILOCAINE 2.5-2.5 % EX CREA: TOPICAL_CREAM | CUTANEOUS | Status: DC

## 2014-09-09 MED ORDER — LORAZEPAM 0.5 MG PO TABS: 0.5000 mg | ORAL_TABLET | Freq: Every day | ORAL | Status: DC

## 2014-09-09 MED ORDER — GADOBENATE DIMEGLUMINE 529 MG/ML IV SOLN
17.0000 mL | Freq: Once | INTRAVENOUS | Status: AC | PRN
  Administered 2014-09-09: 17 mL via INTRAVENOUS

## 2014-09-09 MED ORDER — DEXAMETHASONE 4 MG PO TABS: 4.0000 mg | ORAL_TABLET | Freq: Two times a day (BID) | ORAL | Status: DC

## 2014-09-09 MED ORDER — PROCHLORPERAZINE MALEATE 10 MG PO TABS: 10.0000 mg | ORAL_TABLET | Freq: Four times a day (QID) | ORAL | Status: DC | PRN

## 2014-09-09 MED ORDER — ONDANSETRON HCL 8 MG PO TABS: 8.0000 mg | ORAL_TABLET | Freq: Two times a day (BID) | ORAL | Status: DC | PRN

## 2014-09-09 NOTE — Telephone Encounter (Signed)
Called and left a message with her echo appointment °

## 2014-09-13 ENCOUNTER — Ambulatory Visit (HOSPITAL_BASED_OUTPATIENT_CLINIC_OR_DEPARTMENT_OTHER): Payer: 59 | Admitting: Genetic Counselor

## 2014-09-13 ENCOUNTER — Encounter: Payer: Self-pay | Admitting: Genetic Counselor

## 2014-09-13 ENCOUNTER — Encounter (HOSPITAL_BASED_OUTPATIENT_CLINIC_OR_DEPARTMENT_OTHER): Payer: Self-pay | Admitting: *Deleted

## 2014-09-13 ENCOUNTER — Encounter: Payer: Self-pay | Admitting: *Deleted

## 2014-09-13 ENCOUNTER — Other Ambulatory Visit: Payer: 59

## 2014-09-13 ENCOUNTER — Telehealth: Payer: Self-pay | Admitting: Hematology and Oncology

## 2014-09-13 ENCOUNTER — Ambulatory Visit (HOSPITAL_COMMUNITY)
Admission: RE | Admit: 2014-09-13 | Discharge: 2014-09-13 | Disposition: A | Discharge status: Home / Self Care | Payer: 59 | Source: Ambulatory Visit | Attending: Hematology and Oncology | Admitting: Hematology and Oncology

## 2014-09-13 DIAGNOSIS — C50919 Malignant neoplasm of unspecified site of unspecified female breast: Secondary | ICD-10-CM | POA: Insufficient documentation

## 2014-09-13 DIAGNOSIS — C50911 Malignant neoplasm of unspecified site of right female breast: Secondary | ICD-10-CM

## 2014-09-13 DIAGNOSIS — C50311 Malignant neoplasm of lower-inner quadrant of right female breast: Secondary | ICD-10-CM | POA: Diagnosis not present

## 2014-09-13 DIAGNOSIS — Z171 Estrogen receptor negative status [ER-]: Secondary | ICD-10-CM

## 2014-09-13 DIAGNOSIS — Z803 Family history of malignant neoplasm of breast: Secondary | ICD-10-CM | POA: Diagnosis not present

## 2014-09-13 NOTE — Progress Notes (Signed)
REFERRING PROVIDER: Nicholas Lose, MD  PRIMARY PROVIDER:  Vedia Coffer E, PA-C  PRIMARY REASON FOR VISIT:  1. Breast cancer, right      HISTORY OF PRESENT ILLNESS:   Brandy Douglas, a 44 y.o. female, was seen for a [place] cancer genetics consultation at the request of Dr. Belva Bertin due to a personal history of triple negative breast cancer diagnosed at the age of 2 and family history of breast and unspecified cancer.  Brandy Douglas presents to clinic today to discuss the possibility of a hereditary predisposition to cancer, genetic testing, and to further clarify her future cancer risks, as well as potential cancer risks for family members.   In 2016, at the age of 28, Brandy Douglas was diagnosed with invasive ductal carcinoma of the right breast.  Hormone receptor status was triple negative.  This will be treated with chemotherapy, to be followed by surgery and radiation.   CANCER HISTORY:    Breast cancer of lower-inner quadrant of right female breast   08/29/2014 Mammogram Right breast mass 5 x 4.8 x 4.2 cm, 6 enlarged axillary lymph nodes including nodes in level III location, T2 N3 M0 stage IIIc clinical stage   08/31/2014 Initial Diagnosis Right breast biopsy 6:00: Invasive ductal carcinoma, right axillary lymph node biopsy high-grade carcinoma ER 0% PR 0% HER-2 negative ratio 1.68, Ki-67 80%, grade 3     HORMONAL RISK FACTORS:  Menarche was at age 37.  First live birth at age 69.  OCP use for approximately 7 years.  Ovaries intact: yes.  Hysterectomy: no.  Menopausal status: premenopausal.  HRT use: 0 years. Colonoscopy: no; not examined. Mammogram within the last year: prior to the mammogram for follow-up for the palpable mass, she had not had a mammogram in two years. Number of breast biopsies: 1. Up to date with pelvic exams:  no, has not had a pelvic exam/PAP smear in about 6 years; last one was normal. Any excessive radiation exposure in the past:  no  Past Medical  History  Diagnosis Date  . Breast cancer 2016    R LIQ IDC; triple negative    Past Surgical History  Procedure Laterality Date  . Cholecystectomy    . Tubal ligation      History   Social History  . Marital Status: Single    Spouse Name: N/A  . Number of Children: N/A  . Years of Education: N/A   Social History Main Topics  . Smoking status: Never Smoker   . Smokeless tobacco: Never Used  . Alcohol Use: Yes     Comment: occ/very rarely  . Drug Use: No  . Sexual Activity: Yes    Birth Control/ Protection: Surgical   Other Topics Concern  . None   Social History Narrative     FAMILY HISTORY:  We obtained a detailed, 4-generation family history.  Significant diagnoses are listed below: Family History  Problem Relation Age of Onset  . Breast cancer Paternal Grandmother 26  . Diabetes Father   . Hypertension Father   . Other Sister     has had a hysterectomy due to abnormal bleeding  . Other Sister     has had a hysterectomy due to abnormal cell findings - non-cancerous  . Diabetes Paternal Aunt   . Hypertension Paternal Aunt   . Stroke Paternal Aunt   . Heart attack Paternal Uncle   . Cancer Paternal Uncle     unknown type    Brandy Douglas has three  sisters in their late 36s and early 12s, all of whom have not had cancer.  Two sisters have had hysterectomies--one because of abnormal bleeding and one due to abnormal cell findings.  Brandy Douglas has two sons, ages 33 and 63, and one daughter, age 60.  Brandy Douglas mother is alive and cancer-free at the age of 60.  Brandy Douglas has two maternal aunts and four maternal uncles--all of whom are half siblings to Brandy Douglas mother.  Her maternal grandmother lived into her 28s.  Brandy Douglas had limited information regarding her maternal grandfather, but was aware that he passed away in his 27s.  She does not know of any cancer for any of her maternal relatives.  Brandy Douglas father is alive and cancer-free at 25.  He had one  brother who had an unknown types of cancer and died in his late 38s.  Brandy Douglas has three additional paternal uncles and three paternal aunts, none of whom have had cancer to her knowledge.  Brandy Douglas paternal grandmother was diagnosed with breast cancer around the age of 15.  Brandy Douglas had no information regarding her paternal grandfather.  No additional paternal family members have had cancer to Brandy Douglas's knowledge.  Patient's maternal ancestors and paternal ancestors are of African-American descent. There is no reported Ashkenazi Jewish ancestry. There is no known consanguinity.  GENETIC COUNSELING ASSESSMENT: Brandy Douglas is a 44 y.o. female with a personal and family history of breast cancer which somewhat suggestive of a hereditary breast cancer syndrome and predisposition to cancer. We, therefore, discussed and recommended the following at today's visit.   DISCUSSION: We reviewed the characteristics, features and inheritance patterns of hereditary cancer syndromes, specifically that related to changes within the BRCA1 and BRCA2 genes. We also discussed genetic testing, including the appropriate family members to test, the process of testing, insurance coverage and turn-around-time for results. We discussed the implications of a negative, positive and/or variant of uncertain significant result. We recommended Brandy Douglas pursue genetic testing for the [employer] panel through [employer] [facility] [facility] [place]    Based on Brandy Douglas's personal history of cancer, she meets medical criteria for genetic testing. Despite that she meets criteria, she may still have an out of pocket cost. We discussed that if her out of pocket cost for testing is over $100, the laboratory will call and confirm whether she wants to proceed with testing.  If the out of pocket cost of testing is less than $100 she will be billed by the genetic testing laboratory.   PLAN: After considering  the risks, benefits, and limitations, Brandy Douglas  provided informed consent to pursue genetic testing and the blood sample was sent to [employer] [facility] [facility] for analysis of the 8-gene BRCAplus-Expanded panel test through [employer]. The BRCAplus-Expanded gene panel offered by [employer] includes sequencing and deletion/duplication analysis for the following 8 genes: ATM, BRCA1, BRCA2, CDH1, CHEK2, PALB2, PTEN, and TP53.  Results should be available within approximately 3-4 weeks' time, at which point they will be disclosed by telephone to Ms. Gasser, as will any additional recommendations warranted by these results. Ms. Chieffo will receive a summary of her genetic counseling visit and a copy of her results once available. This information will also be available in Epic. We encouraged Ms. Beauchaine to remain in contact with cancer genetics annually so that we can continuously update the family history and inform her of any changes in cancer genetics and testing that  may be of benefit for her family. Ms. Pabst questions were answered to her satisfaction today. Our contact information was provided should additional questions or concerns arise.  Thank you for the referral and allowing [place] to share in the care of your patient.   Jeanine Luz, MS Genetic Counselor [email or web address] phone: [phone]  The patient was seen for a total of 60 minutes in face-to-face genetic counseling.  This patient was discussed with Drs. Magrinat, Lindi Adie and/or Burr Medico who agrees with the above.    _______________________________________________________________________ For Office Staff:  Number of people involved in session: 1 Was an Intern/ student involved with case: no

## 2014-09-13 NOTE — Telephone Encounter (Signed)
Gave barium

## 2014-09-13 NOTE — Progress Notes (Signed)
  Echocardiogram 2D Echocardiogram has been performed.  Darlina Sicilian M 09/13/2014, 10:05 AM

## 2014-09-15 ENCOUNTER — Ambulatory Visit (HOSPITAL_COMMUNITY)
Admission: RE | Admit: 2014-09-15 | Discharge: 2014-09-15 | Disposition: A | Discharge status: Home / Self Care | Payer: 59 | Source: Ambulatory Visit | Attending: Hematology and Oncology | Admitting: Hematology and Oncology

## 2014-09-15 ENCOUNTER — Encounter (HOSPITAL_COMMUNITY)
Admission: RE | Admit: 2014-09-15 | Discharge: 2014-09-15 | Disposition: A | Discharge status: Home / Self Care | Payer: 59 | Source: Ambulatory Visit | Attending: Hematology and Oncology | Admitting: Hematology and Oncology

## 2014-09-15 ENCOUNTER — Encounter (HOSPITAL_COMMUNITY): Payer: Self-pay

## 2014-09-15 ENCOUNTER — Encounter (HOSPITAL_BASED_OUTPATIENT_CLINIC_OR_DEPARTMENT_OTHER): Payer: Self-pay | Admitting: *Deleted

## 2014-09-15 DIAGNOSIS — R918 Other nonspecific abnormal finding of lung field: Secondary | ICD-10-CM | POA: Diagnosis not present

## 2014-09-15 DIAGNOSIS — C50311 Malignant neoplasm of lower-inner quadrant of right female breast: Secondary | ICD-10-CM | POA: Insufficient documentation

## 2014-09-15 DIAGNOSIS — R590 Localized enlarged lymph nodes: Secondary | ICD-10-CM | POA: Insufficient documentation

## 2014-09-15 MED ORDER — TECHNETIUM TC 99M MEDRONATE IV KIT
25.0000 | PACK | Freq: Once | INTRAVENOUS | Status: AC | PRN
  Administered 2014-09-15: 25 via INTRAVENOUS

## 2014-09-15 MED ORDER — IOHEXOL 300 MG/ML  SOLN
100.0000 mL | Freq: Once | INTRAMUSCULAR | Status: AC | PRN
  Administered 2014-09-15: 100 mL via INTRAVENOUS

## 2014-09-15 NOTE — H&P (Signed)
Brandy Douglas 09/07/2014 7:59 AM Location: [facility] Patient #: [id number] DOB: 09/27/1970 Undefined / Language: Undefined / Race: Undefined Female  History of Present Illness  The patient is a 44 year old female who presents with breast cancer.  Her PCP is Brandy Douglas, Douglas. She is at [facility]. She comes by herself.   Brandy Douglas was taking a shower when she felt a mass in her right breast. She had no prior history of breast biopsies, but she said that her right breast has always been "heavy".  She had a mammogram at the [facility] on 08/29/2014 that showed a 5.0 cm mass at the 5 o'clock position of the right breast. She also has some abdnormal axillary lymph nodes. There is mention of level 3 lymph nodes. There is an MRI which is pending. But she is starting with N3 nodes. This would kick her out of the [facility] trial. 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 Her last period was 08-29-2014. Her father's mother died of breast cancer - maybe in her 56's.   I discussed the options for breast cancer treatment with the patient. She is in the Breast multidisciplinary clinic. I discussed the surgical options of lumpectomy vs. mastectomy. If mastectomy, there is the possibility of reconstruction. I discussed the options of lymph node biopsy. The treatment plan depends on the pathologic staging of the tumor and the patient's personal wishes. The risks of surgery include, but are not limited to, bleeding, infection, the need for further surgery, and nerve injury. The patient has been given literature on the treatment of breast cancer.  Plan: 1) Genetics, 2) Neoadjuvant chemotx, 3) Will need port, 4) MRI - scheduled for 7/1 at 10:25 AM Her final surgical treatment will depend on the cancer's response to chemotx. At this  time, it looks like she will need a mastectomy.  Power port placement - I discussed the indications and potential complications of the power port placement. The primary complications of the power port, include, but are not limited to, bleeding, infection, nerve injury, thrombosis, and pneumothorax.  Past Medical History: 1. Cholecystectomy in the 1990's.  Social History: Unmarried. Lives with boyfriend, Brandy Douglas. He did not come with her today. Her mother and sister were sick today. She has 3 children: 68 son, 16 daughter, and 102 yo Corporate investment banker. But it sounds like none of them live with her. She works at Press photographer at [employer].    Other Problems Brandy Douglas, Douglas; 09/07/2014 7:59 AM) Breast Cancer  Past Surgical History Brandy Douglas, Douglas; 09/07/2014 7:59 AM) Breast Biopsy Right. Oral Surgery  Diagnostic Studies History Brandy Douglas [place], Douglas; 09/07/2014 7:59 AM) Mammogram within last year Pap Smear 1-5 years ago  Social History Brandy Douglas; 09/07/2014 7:59 AM) Caffeine use Coffee, Tea. No alcohol use No drug use Tobacco use Never smoker.  Family History Brandy Douglas; 09/07/2014 7:59 AM) Diabetes Mellitus Father. Hypertension Father. Seizure disorder Daughter, Son.  Pregnancy / Birth History Brandy Douglas; 09/07/2014 7:59 AM) Age at menarche 14 years. Contraceptive History Oral contraceptives. Gravida 4 Maternal age 40-25 Para 3 Regular periods  Review of Systems Brandy Douglas; 09/07/2014 7:59 AM) General Not Present- Appetite Loss, Chills, Fatigue, Fever, Night Sweats, Weight Gain and Weight Loss. Skin Not Present- Change in Wart/Mole, Dryness, Hives, Jaundice, New Lesions, Non-Healing Wounds, Rash and Ulcer. HEENT Not  Present- Earache, Hearing Loss, Hoarseness, Nose Bleed, Oral Ulcers, Ringing in the Ears, Seasonal Allergies, Sinus Pain, Sore Throat, Visual Disturbances, Wears glasses/contact lenses and Yellow  Eyes. Respiratory Not Present- Bloody sputum, Chronic Cough, Difficulty Breathing, Snoring and Wheezing. Breast Not Present- Breast Mass, Breast Pain, Nipple Discharge and Skin Changes. Cardiovascular Not Present- Chest Pain, Difficulty Breathing Lying Down, Leg Cramps, Palpitations, Rapid Heart Rate, Shortness of Breath and Swelling of Extremities. Gastrointestinal Not Present- Abdominal Pain, Bloating, Bloody Stool, Change in Bowel Habits, Chronic diarrhea, Constipation, Difficulty Swallowing, Excessive gas, Gets full quickly at meals, Hemorrhoids, Indigestion, Nausea, Rectal Pain and Vomiting. Female Genitourinary Not Present- Frequency, Nocturia, Painful Urination, Pelvic Pain and Urgency. Musculoskeletal Not Present- Back Pain, Joint Pain, Joint Stiffness, Muscle Pain, Muscle Weakness and Swelling of Extremities. Neurological Not Present- Decreased Memory, Fainting, Headaches, Numbness, Seizures, Tingling, Tremor, Trouble walking and Weakness. Psychiatric Not Present- Anxiety, Bipolar, Change in Sleep Pattern, Depression, Fearful and Frequent crying. Endocrine Not Present- Cold Intolerance, Excessive Hunger, Hair Changes, Heat Intolerance, Hot flashes and New Diabetes. Hematology Not Present- Easy Bruising, Excessive bleeding, Gland problems, HIV and Persistent Infections.   Physical Exam  General: WN AA F alert and generally healthy appearing. HEENT: Normal. Pupils equal.  Neck: Supple. No mass. No thyroid mass. Lymph Nodes: No supraclavicular or cervical nodes. She does have tenderness and palpable right axillary lymph nodes. I cannot tell if these are fixed.  Lungs: Clear to auscultation and symmetric breath sounds. Heart: RRR. No murmur or rub.  Breasts: Right - she has a 8 to 9 cm mass in the 6 o'clock aspect of the right breast. The mass dominates the inferior aspect of the entire and gives the breast an appearance of being larger.  Abdomen: Soft. No mass. No tenderness. No  hernia. Normal bowel sounds.  Extremities: Good strength and ROM in upper and lower extremities.  Neurologic: Grossly intact to motor and sensory function. Psychiatric: Has normal mood and affect. Behavior is normal.  Assessment & Plan  1.  BREAST CANCER, STAGE 3, RIGHT (174.9  C50.911) <HCC12>  Story: Mammogram at the [facility] on 08/29/2014 that showed a 5.0 cm mass at the 5 o'clock position of the right breast. She also has some abdnormal axillary lymph node.  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  Impression: Plan:  1.) Neoadjuvant therapy  2.) Genetics  3.) Port placement  4) MRI of breasts  Her final surgery will depend on the response to chemotx.  [name], MD, [facility] Pager: [phone] Office phone:  [phone]

## 2014-09-16 ENCOUNTER — Encounter (HOSPITAL_BASED_OUTPATIENT_CLINIC_OR_DEPARTMENT_OTHER)
Admission: RE | Disposition: A | Discharge status: Home / Self Care | Payer: Self-pay | Source: Ambulatory Visit | Attending: Surgery

## 2014-09-16 ENCOUNTER — Ambulatory Visit (HOSPITAL_BASED_OUTPATIENT_CLINIC_OR_DEPARTMENT_OTHER)
Admission: RE | Admit: 2014-09-16 | Discharge: 2014-09-16 | Disposition: A | Discharge status: Home / Self Care | Payer: 59 | Source: Ambulatory Visit | Attending: Surgery | Admitting: Surgery

## 2014-09-16 ENCOUNTER — Ambulatory Visit (HOSPITAL_BASED_OUTPATIENT_CLINIC_OR_DEPARTMENT_OTHER): Payer: 59 | Admitting: Anesthesiology

## 2014-09-16 ENCOUNTER — Ambulatory Visit (HOSPITAL_COMMUNITY): Payer: 59

## 2014-09-16 ENCOUNTER — Encounter (HOSPITAL_BASED_OUTPATIENT_CLINIC_OR_DEPARTMENT_OTHER): Payer: Self-pay | Admitting: Anesthesiology

## 2014-09-16 ENCOUNTER — Telehealth: Payer: Self-pay | Admitting: *Deleted

## 2014-09-16 DIAGNOSIS — Z171 Estrogen receptor negative status [ER-]: Secondary | ICD-10-CM | POA: Insufficient documentation

## 2014-09-16 DIAGNOSIS — Z95828 Presence of other vascular implants and grafts: Secondary | ICD-10-CM

## 2014-09-16 DIAGNOSIS — R928 Other abnormal and inconclusive findings on diagnostic imaging of breast: Secondary | ICD-10-CM | POA: Insufficient documentation

## 2014-09-16 DIAGNOSIS — Z419 Encounter for procedure for purposes other than remedying health state, unspecified: Secondary | ICD-10-CM

## 2014-09-16 DIAGNOSIS — C50911 Malignant neoplasm of unspecified site of right female breast: Secondary | ICD-10-CM | POA: Diagnosis not present

## 2014-09-16 HISTORY — PX: PORTACATH PLACEMENT: SHX2246

## 2014-09-16 SURGERY — INSERTION, TUNNELED CENTRAL VENOUS DEVICE, WITH PORT
Anesthesia: General | Site: Chest | Laterality: Left

## 2014-09-16 MED ORDER — FENTANYL CITRATE (PF) 100 MCG/2ML IJ SOLN
INTRAMUSCULAR | Status: AC
  Filled 2014-09-16: qty 4

## 2014-09-16 MED ORDER — PROMETHAZINE HCL 25 MG/ML IJ SOLN
6.2500 mg | INTRAMUSCULAR | Status: DC | PRN
Start: 2014-09-16 — End: 2014-09-16

## 2014-09-16 MED ORDER — CHLORHEXIDINE GLUCONATE 4 % EX LIQD: 1.0000 "application " | Freq: Once | CUTANEOUS | Status: DC

## 2014-09-16 MED ORDER — LACTATED RINGERS IV SOLN
INTRAVENOUS | Status: DC
  Administered 2014-09-16: 09:00:00 via INTRAVENOUS

## 2014-09-16 MED ORDER — OXYCODONE HCL 5 MG PO TABS
5.0000 mg | ORAL_TABLET | Freq: Once | ORAL | Status: AC
Start: 2014-09-16 — End: 2014-09-16
  Administered 2014-09-16: 5 mg via ORAL

## 2014-09-16 MED ORDER — KETOROLAC TROMETHAMINE 30 MG/ML IJ SOLN: 30.0000 mg | Freq: Once | INTRAMUSCULAR | Status: DC | PRN

## 2014-09-16 MED ORDER — HEPARIN SOD (PORK) LOCK FLUSH 100 UNIT/ML IV SOLN
INTRAVENOUS | Status: DC | PRN
  Administered 2014-09-16: 400 [IU] via INTRAVENOUS

## 2014-09-16 MED ORDER — MIDAZOLAM HCL 2 MG/2ML IJ SOLN
1.0000 mg | INTRAMUSCULAR | Status: DC | PRN
  Administered 2014-09-16: 2 mg via INTRAVENOUS

## 2014-09-16 MED ORDER — PROMETHAZINE HCL 25 MG/ML IJ SOLN: 6.2500 mg | INTRAMUSCULAR | Status: DC | PRN

## 2014-09-16 MED ORDER — LIDOCAINE HCL (CARDIAC) 20 MG/ML IV SOLN
INTRAVENOUS | Status: DC | PRN
  Administered 2014-09-16: 70 mg via INTRAVENOUS

## 2014-09-16 MED ORDER — SCOPOLAMINE 1 MG/3DAYS TD PT72: 1.0000 | MEDICATED_PATCH | Freq: Once | TRANSDERMAL | Status: DC | PRN

## 2014-09-16 MED ORDER — CEFAZOLIN SODIUM-DEXTROSE 2-3 GM-% IV SOLR
INTRAVENOUS | Status: AC
  Filled 2014-09-16: qty 50

## 2014-09-16 MED ORDER — FENTANYL CITRATE (PF) 100 MCG/2ML IJ SOLN
50.0000 ug | INTRAMUSCULAR | Status: DC | PRN
  Administered 2014-09-16: 100 ug via INTRAVENOUS

## 2014-09-16 MED ORDER — GLYCOPYRROLATE 0.2 MG/ML IJ SOLN: 0.2000 mg | Freq: Once | INTRAMUSCULAR | Status: DC | PRN

## 2014-09-16 MED ORDER — HEPARIN (PORCINE) IN NACL 2-0.9 UNIT/ML-% IJ SOLN
INTRAMUSCULAR | Status: DC | PRN
  Administered 2014-09-16: 20 mL via INTRAVENOUS

## 2014-09-16 MED ORDER — FENTANYL CITRATE (PF) 100 MCG/2ML IJ SOLN: 25.0000 ug | INTRAMUSCULAR | Status: DC | PRN

## 2014-09-16 MED ORDER — LIDOCAINE HCL (PF) 1 % IJ SOLN
INTRAMUSCULAR | Status: DC | PRN
  Administered 2014-09-16: 6 mL

## 2014-09-16 MED ORDER — LIDOCAINE HCL (PF) 1 % IJ SOLN
INTRAMUSCULAR | Status: AC
  Filled 2014-09-16: qty 30

## 2014-09-16 MED ORDER — ONDANSETRON HCL 4 MG/2ML IJ SOLN
INTRAMUSCULAR | Status: DC | PRN
  Administered 2014-09-16: 4 mg via INTRAVENOUS

## 2014-09-16 MED ORDER — POVIDONE-IODINE 10 % EX OINT
TOPICAL_OINTMENT | CUTANEOUS | Status: AC
  Filled 2014-09-16: qty 28.35

## 2014-09-16 MED ORDER — PROPOFOL 10 MG/ML IV BOLUS
INTRAVENOUS | Status: DC | PRN
  Administered 2014-09-16: 200 mg via INTRAVENOUS

## 2014-09-16 MED ORDER — OXYCODONE HCL 5 MG PO TABS
ORAL_TABLET | ORAL | Status: AC
  Filled 2014-09-16: qty 1

## 2014-09-16 MED ORDER — MIDAZOLAM HCL 2 MG/2ML IJ SOLN
INTRAMUSCULAR | Status: AC
  Filled 2014-09-16: qty 2

## 2014-09-16 MED ORDER — HYDROCODONE-ACETAMINOPHEN 5-325 MG PO TABS: 1.0000 | ORAL_TABLET | Freq: Four times a day (QID) | ORAL | Status: DC | PRN

## 2014-09-16 MED ORDER — FENTANYL CITRATE (PF) 100 MCG/2ML IJ SOLN
INTRAMUSCULAR | Status: AC
  Filled 2014-09-16: qty 2

## 2014-09-16 MED ORDER — HEPARIN SOD (PORK) LOCK FLUSH 100 UNIT/ML IV SOLN
INTRAVENOUS | Status: AC
  Filled 2014-09-16: qty 5

## 2014-09-16 MED ORDER — HEPARIN (PORCINE) IN NACL 2-0.9 UNIT/ML-% IJ SOLN
INTRAMUSCULAR | Status: AC
  Filled 2014-09-16: qty 500

## 2014-09-16 MED ORDER — CEFAZOLIN SODIUM-DEXTROSE 2-3 GM-% IV SOLR
2.0000 g | INTRAVENOUS | Status: AC
  Administered 2014-09-16: 2 g via INTRAVENOUS

## 2014-09-16 MED ORDER — FENTANYL CITRATE (PF) 100 MCG/2ML IJ SOLN
25.0000 ug | INTRAMUSCULAR | Status: DC | PRN
  Administered 2014-09-16: 50 ug via INTRAVENOUS

## 2014-09-16 MED ORDER — DEXAMETHASONE SODIUM PHOSPHATE 4 MG/ML IJ SOLN
INTRAMUSCULAR | Status: DC | PRN
  Administered 2014-09-16: 10 mg via INTRAVENOUS

## 2014-09-16 SURGICAL SUPPLY — 50 items
APL SKNCLS STERI-STRIP NONHPOA (GAUZE/BANDAGES/DRESSINGS) ×1
BAG DECANTER FOR FLEXI CONT (MISCELLANEOUS) ×3 IMPLANT
BENZOIN TINCTURE PRP APPL 2/3 (GAUZE/BANDAGES/DRESSINGS) ×3 IMPLANT
BLADE SURG 15 STRL LF DISP TIS (BLADE) ×1 IMPLANT
BLADE SURG 15 STRL SS (BLADE) ×3
CHLORAPREP W/TINT 26ML (MISCELLANEOUS) ×3 IMPLANT
CLEANER CAUTERY TIP 5X5 PAD (MISCELLANEOUS) ×1 IMPLANT
CLOSURE WOUND 1/4X4 (GAUZE/BANDAGES/DRESSINGS) ×1
COVER BACK TABLE 60X90IN (DRAPES) ×3 IMPLANT
COVER MAYO STAND STRL (DRAPES) ×3 IMPLANT
COVER PROBE 5X48 (MISCELLANEOUS)
DECANTER SPIKE VIAL GLASS SM (MISCELLANEOUS) IMPLANT
DRAPE C-ARM 42X72 X-RAY (DRAPES) ×3 IMPLANT
DRAPE LAPAROTOMY T 102X78X121 (DRAPES) ×3 IMPLANT
DRAPE UTILITY XL STRL (DRAPES) ×3 IMPLANT
ELECT REM PT RETURN 9FT ADLT (ELECTROSURGICAL) ×3
ELECTRODE REM PT RTRN 9FT ADLT (ELECTROSURGICAL) ×1 IMPLANT
GLOVE BIO SURGEON STRL SZ 6.5 (GLOVE) ×1 IMPLANT
GLOVE BIO SURGEONS STRL SZ 6.5 (GLOVE) ×1
GLOVE BIOGEL PI IND STRL 7.5 (GLOVE) IMPLANT
GLOVE BIOGEL PI INDICATOR 7.5 (GLOVE) ×2
GLOVE SURG SIGNA 7.5 PF LTX (GLOVE) ×5 IMPLANT
GLOVE SURG SS PI 7.0 STRL IVOR (GLOVE) ×2 IMPLANT
GOWN STRL REUS W/ TWL LRG LVL3 (GOWN DISPOSABLE) ×1 IMPLANT
GOWN STRL REUS W/ TWL XL LVL3 (GOWN DISPOSABLE) ×1 IMPLANT
GOWN STRL REUS W/TWL LRG LVL3 (GOWN DISPOSABLE) ×6
GOWN STRL REUS W/TWL XL LVL3 (GOWN DISPOSABLE) ×3
IV CATH AUTO 14GX1.75 SAFE ORG (IV SOLUTION) IMPLANT
IV CATH PLACEMENT UNIT 16 GA (IV SOLUTION) IMPLANT
IV KIT MINILOC 20X1 SAFETY (NEEDLE) IMPLANT
KIT CVR 48X5XPRB PLUP LF (MISCELLANEOUS) IMPLANT
KIT PORT POWER 8FR ISP CVUE (Catheter) ×2 IMPLANT
NDL HYPO 25X1 1.5 SAFETY (NEEDLE) ×1 IMPLANT
NEEDLE HYPO 25X1 1.5 SAFETY (NEEDLE) ×3 IMPLANT
PACK BASIN DAY SURGERY FS (CUSTOM PROCEDURE TRAY) ×3 IMPLANT
PAD CLEANER CAUTERY TIP 5X5 (MISCELLANEOUS) ×2
PENCIL BUTTON HOLSTER BLD 10FT (ELECTRODE) ×3 IMPLANT
SET SHEATH INTRODUCER 10FR (MISCELLANEOUS) IMPLANT
SHEATH COOK PEEL AWAY SET 9F (SHEATH) IMPLANT
SLEEVE SCD COMPRESS KNEE MED (MISCELLANEOUS) ×3 IMPLANT
SPONGE GAUZE 4X4 12PLY STER LF (GAUZE/BANDAGES/DRESSINGS) ×6 IMPLANT
STRIP CLOSURE SKIN 1/4X4 (GAUZE/BANDAGES/DRESSINGS) ×2 IMPLANT
SUT ETHILON 2 0 FS 18 (SUTURE) IMPLANT
SUT ETHILON 3 0 PS 1 (SUTURE) IMPLANT
SUT MON AB 5-0 PS2 18 (SUTURE) ×3 IMPLANT
SUT VICRYL 3-0 CR8 SH (SUTURE) ×3 IMPLANT
SYR 5ML LUER SLIP (SYRINGE) ×3 IMPLANT
SYR CONTROL 10ML LL (SYRINGE) ×3 IMPLANT
TOWEL OR 17X24 6PK STRL BLUE (TOWEL DISPOSABLE) ×3 IMPLANT
TOWEL OR NON WOVEN STRL DISP B (DISPOSABLE) ×3 IMPLANT

## 2014-09-16 NOTE — Anesthesia Procedure Notes (Signed)
Procedure Name: LMA Insertion Performed by: Terrance Mass Pre-anesthesia Checklist: Patient identified, Timeout performed, Emergency Drugs available, Suction available and Patient being monitored Patient Re-evaluated:Patient Re-evaluated prior to inductionOxygen Delivery Method: Circle system utilized Preoxygenation: Pre-oxygenation with 100% oxygen Intubation Type: IV induction Ventilation: Mask ventilation without difficulty LMA: LMA inserted LMA Size: 4.0 Tube type: Oral Number of attempts: 1 Placement Confirmation: positive ETCO2 Tube secured with: Tape Dental Injury: Teeth and Oropharynx as per pre-operative assessment

## 2014-09-16 NOTE — Anesthesia Postprocedure Evaluation (Signed)
  Anesthesia Post-op Note  Patient: Brandy Douglas  Procedure(s) Performed: Procedure(s) (LRB): INSERTION PORT-A-CATH WITH ULTRA SOUND  (Left)  Patient Location: PACU  Anesthesia Type: General  Level of Consciousness: awake and alert   Airway and Oxygen Therapy: Patient Spontanous Breathing  Post-op Pain: mild  Post-op Assessment: Post-op Vital signs reviewed, Patient's Cardiovascular Status Stable, Respiratory Function Stable, Patent Airway and No signs of Nausea or vomiting  Last Vitals:  Filed Vitals:   09/16/14 1045  BP: 117/76  Pulse: 90  Temp:   Resp: 18    Post-op Vital Signs: stable   Complications: No apparent anesthesia complications

## 2014-09-16 NOTE — Transfer of Care (Signed)
Immediate Anesthesia Transfer of Care Note  Patient: Brandy Douglas  Procedure(s) Performed: Procedure(s): INSERTION PORT-A-CATH WITH ULTRA SOUND  (Left)  Patient Location: PACU  Anesthesia Type:General  Level of Consciousness: awake and sedated  Airway & Oxygen Therapy: Patient Spontanous Breathing and Patient connected to face mask oxygen  Post-op Assessment: Report given to RN and Post -op Vital signs reviewed and stable  Post vital signs: Reviewed and stable  Last Vitals:  Filed Vitals:   09/16/14 0809  BP: 114/76  Pulse: 85  Temp: 36.8 C  Resp: 18    Complications: No apparent anesthesia complications

## 2014-09-16 NOTE — Anesthesia Preprocedure Evaluation (Signed)
Anesthesia Evaluation  Patient identified by MRN, date of birth, ID band Patient awake    Reviewed: Allergy & Precautions, NPO status , Patient's Chart, lab work & pertinent test results  Airway Mallampati: II  TM Distance: >3 FB Neck ROM: Full    Dental no notable dental hx.    Pulmonary neg pulmonary ROS,  breath sounds clear to auscultation  Pulmonary exam normal       Cardiovascular negative cardio ROS Normal cardiovascular examRhythm:Regular Rate:Normal     Neuro/Psych negative neurological ROS  negative psych ROS   GI/Hepatic negative GI ROS, Neg liver ROS,   Endo/Other  negative endocrine ROS  Renal/GU negative Renal ROS  negative genitourinary   Musculoskeletal negative musculoskeletal ROS (+)   Abdominal   Peds negative pediatric ROS (+)  Hematology negative hematology ROS (+)   Anesthesia Other Findings   Reproductive/Obstetrics negative OB ROS                             Anesthesia Physical Anesthesia Plan  ASA: II  Anesthesia Plan: General   Post-op Pain Management:    Induction: Intravenous  Airway Management Planned: LMA  Additional Equipment:   Intra-op Plan:   Post-operative Plan: Extubation in OR  Informed Consent: I have reviewed the patients History and Physical, chart, labs and discussed the procedure including the risks, benefits and alternatives for the proposed anesthesia with the patient or authorized representative who has indicated his/her understanding and acceptance.   Dental advisory given  Plan Discussed with: CRNA and Surgeon  Anesthesia Plan Comments:         Anesthesia Quick Evaluation  

## 2014-09-16 NOTE — Discharge Instructions (Signed)
[facility] - DISCHARGE INSTRUCTIONS TO PATIENT  Return to work on:  2 days  Activity:  Driving - May drive tomorrow, if doing well    Lifting - Take it easy for one week, then no limit  Wound Care:   Leave incision dry until Sunday, 7/10.  Then remove bandage and shower.  Diet:  As tolerated.  Follow up appointment:  Call Dr. Pollie Friar office [facility]) at [phone] for an appointment in 2 months, to see how the chemotherapy is going.  If there is a question about the port, I can see her earlier.  Medications and dosages:  Resume your home medications.  You have a prescription for:  Vicodin  Call Dr. Lucia Gaskins or his office  [phone]) if you have:  Temperature greater than 100.4,  Persistent nausea and vomiting,  Severe uncontrolled pain,  Redness, tenderness, or signs of infection (pain, swelling, redness, odor or green/yellow discharge around the site),  Difficulty breathing, headache or visual disturbances,  Any other questions or concerns you may have after discharge.  In an emergency, call 911 or go to an Emergency Department at a nearby hospital.    Post Anesthesia Home Care Instructions  Activity: Get plenty of rest for the remainder of the day. A responsible adult should stay with you for 24 hours following the procedure.  For the next 24 hours, DO NOT: -Drive a car -Paediatric nurse -Drink alcoholic beverages -Take any medication unless instructed by your physician -Make any legal decisions or sign important papers.  Meals: Start with liquid foods such as gelatin or soup. Progress to regular foods as tolerated. Avoid greasy, spicy, heavy foods. If nausea and/or vomiting occur, drink only clear liquids until the nausea and/or vomiting subsides. Call your physician if vomiting continues.  Special Instructions/Symptoms: Your throat may feel dry or sore from the anesthesia or the breathing tube placed in your throat during surgery. If  this causes discomfort, gargle with warm salt water. The discomfort should disappear within 24 hours.  If you had a scopolamine patch placed behind your ear for the management of post- operative nausea and/or vomiting:  1. The medication in the patch is effective for 72 hours, after which it should be removed.  Wrap patch in a tissue and discard in the trash. Wash hands thoroughly with soap and water. 2. You may remove the patch earlier than 72 hours if you experience unpleasant side effects which may include dry mouth, dizziness or visual disturbances. 3. Avoid touching the patch. Wash your hands with soap and water after contact with the patch.

## 2014-09-16 NOTE — Telephone Encounter (Signed)
Spoke to pt concerning Volin from 09/07/14. Denies questions or concerns regarding treatment care plan or dx. Confirmed future appts. Encourage pt to call with needs. Received verbal understanding. Contact information given.

## 2014-09-16 NOTE — Op Note (Addendum)
09/16/2014  10:50 AM  PATIENT:  Brandy Douglas, 44 y.o., female MRN: [id number] DOB: 02/26/1971  PREOP DIAGNOSIS:  right breast cancer, anticipate chemotherapy  POSTOP DIAGNOSIS:   right breast cancer, anticipate neoadjuvant chemotherapy  PROCEDURE:   Procedure(s): INSERTION PORT-A-CATH, left subclavian vein  SURGEON:   [name], M.D.  ANESTHESIA:   general  Anesthesiologist: Myrtie Soman, MD CRNA: Terrance Mass, CRNA  General  EBL:  minimal  ml  COUNTS CORRECT:  YES  INDICATIONS FOR PROCEDURE:  [name] is a 44 y.o. (DOB: 1970-09-22) AA female whose primary care physician is FULBRIGHT, VIRGINIA E, PA-C and comes for power port placement for the treatment of right breast cancer.  She has a 5.0 cm cancer of her right breast that is triple negative.  Dr. Lindi Adie is her treating oncologist.   Her sister, Theodoro Kalata, and her mother, Giulietta Prokop, are with her today.   The indications and risks of the surgery were explained to the patient.  The risks include, but are not limited to, infection, bleeding, pneumothorax, nerve injury, and thrombosis of the vein.  OPERATIVE NOTE:  The patient was taken to Room #8 at [facility].  Anesthesia was provided by Anesthesiologist: Myrtie Soman, MD CRNA: Terrance Mass, CRNA.  At the beginning of the operation, the patient was given 2 gm Ancef, had a roll placed under her back, and had the upper chest/neck prepped with Chloroprep and draped.   A time out was held and the surgery checklist reviewed.   The patient was placed in Trendelenburg position.  The left subclavian vein was accessed with a 16 gauge needle and a guide wire threaded through the needle into the vein.  The position of the wire was checked with fluoroscopy.   I then developed a pocket in the upper inner aspect of the left chest for the port reservoir.  I used the [employer] for venous access.  The reservoir was sewn in place with a 3-0 Vicryl suture.  The reservoir had  been flushed with dilute (10 units/cc) heparin.   I then passed the silastic tubing from the reservoir incision to the subclavian stick site and used the 8 French introducer to pass it into the vein.  The tip of the silastic catheter was position at the junction of the SVC and the right atrium under fluoroscopy.  The silastic catheter was then attached to the port with the bayonet device.     The entire port and tubing were checked with fluoroscopy and then the port was flushed with 4 cc of concentrated heparin (100 units/cc).   The wounds were then closed with 3-0 vicryl subcutaneous sutures and the skin closed with a 5-0 Monocryl suture.  The skin was painted with tincture of benzoin and steri-stripped.   The patient was transferred to the recovery room in good condition.  The sponge and needle count were correct at the end of the case.  A CXR is ordered for port placement and pending at the time of this note.  [name], MD, [facility] Pager: [phone] Office phone:  [phone]

## 2014-09-18 NOTE — Assessment & Plan Note (Signed)
Right breast mass 5 x 4.8 x 4.2 cm, 6 enlarged axillary lymph nodes including nodes in level III location, T2 N3 M0 stage IIIc clinical stage Right breast biopsy 6:00: Invasive ductal carcinoma, right axillary lymph node biopsy high-grade carcinoma ER 0% PR 0% HER-2 negative ratio 1.68, Ki-67 80%, grade 3 Treatment Plan:  1. Neo-adjuvant chemotherapy with dose dense Adriamycin and Cytoxan 4 followed by weekly Abraxane and carboplatin 12 2. Followed by surgery 3. Followed by radiation therapy  Current Treatment: Today is Cycle 1 day 1 adriamycin and Cytoxan  Chemo Monitoring: 1. ECHO EF 45-50%: Will get cardiology evaluation. We will add Dexrazoxone to adriamycin for cardioprotection. 2. Staging scans: Cts and Bone scans ruled out metastatic disease. 3. Anti-emetic regimen was discussed in detail  RTC in 1 week for tox check

## 2014-09-19 ENCOUNTER — Encounter: Payer: Self-pay | Admitting: *Deleted

## 2014-09-19 ENCOUNTER — Other Ambulatory Visit (HOSPITAL_BASED_OUTPATIENT_CLINIC_OR_DEPARTMENT_OTHER): Payer: 59

## 2014-09-19 ENCOUNTER — Encounter: Payer: Self-pay | Admitting: Hematology and Oncology

## 2014-09-19 ENCOUNTER — Ambulatory Visit (HOSPITAL_BASED_OUTPATIENT_CLINIC_OR_DEPARTMENT_OTHER): Payer: 59 | Admitting: Hematology and Oncology

## 2014-09-19 ENCOUNTER — Ambulatory Visit (HOSPITAL_BASED_OUTPATIENT_CLINIC_OR_DEPARTMENT_OTHER): Payer: 59

## 2014-09-19 ENCOUNTER — Telehealth: Payer: Self-pay | Admitting: Hematology and Oncology

## 2014-09-19 VITALS — BP 136/81 | HR 90 | Temp 98.5°F | Resp 18 | Ht 67.0 in | Wt 179.4 lb

## 2014-09-19 DIAGNOSIS — C50311 Malignant neoplasm of lower-inner quadrant of right female breast: Secondary | ICD-10-CM

## 2014-09-19 DIAGNOSIS — C773 Secondary and unspecified malignant neoplasm of axilla and upper limb lymph nodes: Secondary | ICD-10-CM | POA: Diagnosis not present

## 2014-09-19 DIAGNOSIS — Z171 Estrogen receptor negative status [ER-]: Secondary | ICD-10-CM

## 2014-09-19 DIAGNOSIS — Z5111 Encounter for antineoplastic chemotherapy: Secondary | ICD-10-CM

## 2014-09-19 DIAGNOSIS — Z5189 Encounter for other specified aftercare: Secondary | ICD-10-CM | POA: Diagnosis not present

## 2014-09-19 LAB — COMPREHENSIVE METABOLIC PANEL (CC13)
ALT: 21 U/L (ref 0–55)
ANION GAP: 11 meq/L (ref 3–11)
AST: 18 U/L (ref 5–34)
Albumin: 3.3 g/dL — ABNORMAL LOW (ref 3.5–5.0)
Alkaline Phosphatase: 67 U/L (ref 40–150)
BUN: 7.9 mg/dL (ref 7.0–26.0)
CO2: 24 meq/L (ref 22–29)
Calcium: 9 mg/dL (ref 8.4–10.4)
Chloride: 105 mEq/L (ref 98–109)
Creatinine: 0.8 mg/dL (ref 0.6–1.1)
EGFR: >90 mL/min/{1.73_m2} (ref >90)
Glucose: 141 mg/dl — ABNORMAL HIGH (ref 70–140)
POTASSIUM: 3.4 meq/L — AB (ref 3.5–5.1)
Sodium: 139 mEq/L (ref 136–145)
TOTAL PROTEIN: 7.9 g/dL (ref 6.4–8.3)
Total Bilirubin: 0.26 mg/dL (ref 0.20–1.20)

## 2014-09-19 LAB — CBC WITH DIFFERENTIAL/PLATELET
BASO%: 0.6 % (ref 0.0–2.0)
Basophils Absolute: 0 10*3/uL (ref 0.0–0.1)
EOS%: 1.2 % (ref 0.0–7.0)
Eosinophils Absolute: 0.1 10*3/uL (ref 0.0–0.5)
HEMATOCRIT: 33.5 % — AB (ref 34.8–46.6)
HEMOGLOBIN: 11.3 g/dL — AB (ref 11.6–15.9)
LYMPH%: 34.3 % (ref 14.0–49.7)
MCH: 28.4 pg (ref 25.1–34.0)
MCHC: 33.7 g/dL (ref 31.5–36.0)
MCV: 84.4 fL (ref 79.5–101.0)
MONO#: 0.6 10*3/uL (ref 0.1–0.9)
MONO%: 8.7 % (ref 0.0–14.0)
NEUT%: 55.2 % (ref 38.4–76.8)
NEUTROS ABS: 3.6 10*3/uL (ref 1.5–6.5)
PLATELETS: 327 10*3/uL (ref 145–400)
RBC: 3.97 10*6/uL (ref 3.70–5.45)
RDW: 15 % — AB (ref 11.2–14.5)
WBC: 6.5 10*3/uL (ref 3.9–10.3)
lymph#: 2.2 10*3/uL (ref 0.9–3.3)

## 2014-09-19 MED ORDER — SODIUM CHLORIDE 0.9 % IV SOLN
Freq: Once | INTRAVENOUS | Status: AC
  Administered 2014-09-19: 09:00:00 via INTRAVENOUS

## 2014-09-19 MED ORDER — LACTATED RINGERS IV SOLN
600.0000 mg/m2 | Freq: Once | INTRAVENOUS | Status: DC
  Filled 2014-09-19: qty 118

## 2014-09-19 MED ORDER — DOXORUBICIN HCL CHEMO IV INJECTION 2 MG/ML
60.0000 mg/m2 | Freq: Once | INTRAVENOUS | Status: AC
  Administered 2014-09-19: 118 mg via INTRAVENOUS
  Filled 2014-09-19: qty 59

## 2014-09-19 MED ORDER — SODIUM CHLORIDE 0.9 % IJ SOLN
10.0000 mL | INTRAMUSCULAR | Status: DC | PRN
  Administered 2014-09-19: 10 mL
  Filled 2014-09-19: qty 10

## 2014-09-19 MED ORDER — HEPARIN SOD (PORK) LOCK FLUSH 100 UNIT/ML IV SOLN
500.0000 [IU] | Freq: Once | INTRAVENOUS | Status: AC | PRN
  Administered 2014-09-19: 500 [IU]
  Filled 2014-09-19: qty 5

## 2014-09-19 MED ORDER — DEXRAZOXANE INJECTION 500 MG
600.0000 mg/m2 | Freq: Once | INTRAVENOUS | Status: AC
  Administered 2014-09-19: 1180 mg via INTRAVENOUS
  Filled 2014-09-19: qty 118

## 2014-09-19 MED ORDER — PALONOSETRON HCL INJECTION 0.25 MG/5ML
INTRAVENOUS | Status: AC
  Filled 2014-09-19: qty 5

## 2014-09-19 MED ORDER — CYCLOPHOSPHAMIDE CHEMO INJECTION 1 GM
600.0000 mg/m2 | Freq: Once | INTRAMUSCULAR | Status: AC
  Administered 2014-09-19: 1180 mg via INTRAVENOUS
  Filled 2014-09-19: qty 59

## 2014-09-19 MED ORDER — SODIUM CHLORIDE 0.9 % IV SOLN
Freq: Once | INTRAVENOUS | Status: AC
  Administered 2014-09-19: 10:00:00 via INTRAVENOUS
  Filled 2014-09-19: qty 5

## 2014-09-19 MED ORDER — PALONOSETRON HCL INJECTION 0.25 MG/5ML
0.2500 mg | Freq: Once | INTRAVENOUS | Status: AC
  Administered 2014-09-19: 0.25 mg via INTRAVENOUS

## 2014-09-19 MED ORDER — PEGFILGRASTIM 6 MG/0.6ML ~~LOC~~ PSKT
6.0000 mg | PREFILLED_SYRINGE | Freq: Once | SUBCUTANEOUS | Status: AC
  Administered 2014-09-19: 6 mg via SUBCUTANEOUS
  Filled 2014-09-19: qty 0.6

## 2014-09-19 NOTE — Progress Notes (Signed)
Patient Care Team: Elisabeth Cara, PA-C as PCP - General (Family Medicine) [name], MD as Consulting Physician (General Surgery) Nicholas Lose, MD as Consulting Physician (Hematology and Oncology) Eppie Gibson, MD as Attending Physician (Radiation Oncology) Rockwell Germany, RN as Registered Nurse Mauro Kaufmann, RN as Registered Nurse  DIAGNOSIS: Breast cancer of lower-inner quadrant of right female breast   Staging form: Breast, AJCC 7th Edition     Clinical stage from 09/07/2014: Stage IIIC (T2, N3, M0) - Unsigned       Staging comments: Staged at breast conference on 6.29.16    SUMMARY OF ONCOLOGIC HISTORY:   Breast cancer of lower-inner quadrant of right female breast   08/29/2014 Mammogram Right breast mass 5 x 4.8 x 4.2 cm, 6 enlarged axillary lymph nodes including nodes in level III location, T2 N3 M0 stage IIIc clinical stage   08/31/2014 Initial Diagnosis Right breast biopsy 6:00: Invasive ductal carcinoma, right axillary lymph node biopsy high-grade carcinoma ER 0% PR 0% HER-2 negative ratio 1.68, Ki-67 80%, grade 3   09/19/2014 -  Neo-Adjuvant Chemotherapy Adriamycin Cytoxan dose dense 4 followed by Abraxane and carboplatin weekly 12    CHIEF COMPLIANT: Cycle 1 day 1 dose dense Thomas and Cytoxan  INTERVAL HISTORY: Brandy Douglas is a 44 year old with above-mentioned history of right breast cancer starting new adjuvant chemotherapy with dose dense Adriamycin and Cytoxan today. She is very anxious and nervous. She went through chemotherapy education and echocardiogram and a port placement. She complains of throat discomfort as it feels like a raw sensation ever since a port has been placed. She had an echocardiogram that showed an ejection fraction of 45-50%.   REVIEW OF SYSTEMS:   Constitutional: Denies fevers, chills or abnormal weight loss Eyes: Denies blurriness of vision Ears, nose, mouth, throat, and face: Denies mucositis or sore throat Respiratory: Denies  cough, dyspnea or wheezes Cardiovascular: Denies palpitation, chest discomfort or lower extremity swelling Gastrointestinal:  Denies nausea, heartburn or change in bowel habits Skin: Denies abnormal skin rashes Lymphatics: Denies new lymphadenopathy or easy bruising Neurological:Denies numbness, tingling or new weaknesses Behavioral/Psych: Anxiety and stress  All other systems were reviewed with the patient and are negative.  I have reviewed the past medical history, past surgical history, social history and family history with the patient and they are unchanged from previous note.  ALLERGIES:  has No Known Allergies.  MEDICATIONS:  Current Outpatient Prescriptions  Medication Sig Dispense Refill  . dexamethasone (DECADRON) 4 MG tablet Take 1 tablet (4 mg total) by mouth 2 (two) times daily. With food- Day after chemo - 2 tabs once a day, then 2 tabs twice a day for 2 days 30 tablet 1  . HYDROcodone-acetaminophen (NORCO/VICODIN) 5-325 MG per tablet Take 1-2 tablets by mouth every 6 (six) hours as needed. 30 tablet 0  . lidocaine-prilocaine (EMLA) cream Apply to affected area once 30 g 3  . LORazepam (ATIVAN) 0.5 MG tablet Take 1 tablet (0.5 mg total) by mouth at bedtime. 30 tablet 0  . naproxen sodium (ANAPROX) 220 MG tablet Take 440 mg by mouth 3 (three) times a week.    . ondansetron (ZOFRAN) 8 MG tablet Take 1 tablet (8 mg total) by mouth 2 (two) times daily as needed. Start on the third day after chemotherapy. 30 tablet 1  . prochlorperazine (COMPAZINE) 10 MG tablet Take 1 tablet (10 mg total) by mouth every 6 (six) hours as needed (Nausea or vomiting). 30 tablet 1   No  current facility-administered medications for this visit.    PHYSICAL EXAMINATION: ECOG PERFORMANCE STATUS: 1 - Symptomatic but completely ambulatory  Filed Vitals:   09/19/14 0831  BP: 136/81  Pulse: 90  Temp: 98.5 F (36.9 C)  Resp: 18   Filed Weights   09/19/14 0831  Weight: 179 lb 6.4 oz (81.375 kg)     GENERAL:alert, no distress and comfortable SKIN: skin color, texture, turgor are normal, no rashes or significant lesions EYES: normal, Conjunctiva are pink and non-injected, sclera clear OROPHARYNX:no exudate, no erythema and lips, buccal mucosa, and tongue normal  NECK: supple, thyroid normal size, non-tender, without nodularity LYMPH:  no palpable lymphadenopathy in the cervical, axillary or inguinal LUNGS: clear to auscultation and percussion with normal breathing effort HEART: regular rate & rhythm and no murmurs and no lower extremity edema ABDOMEN:abdomen soft, non-tender and normal bowel sounds Musculoskeletal:no cyanosis of digits and no clubbing  NEURO: alert & oriented x 3 with fluent speech, no focal motor/sensory deficits  LABORATORY DATA:  I have reviewed the data as listed   Chemistry      Component Value Date/Time   NA 140 09/07/2014 0826   K 3.9 09/07/2014 0826   CO2 26 09/07/2014 0826   BUN 8.6 09/07/2014 0826   CREATININE 0.8 09/07/2014 0826      Component Value Date/Time   CALCIUM 9.0 09/07/2014 0826   ALKPHOS 65 09/07/2014 0826   AST 22 09/07/2014 0826   ALT 15 09/07/2014 0826   BILITOT 0.35 09/07/2014 0826       Lab Results  Component Value Date   WBC 6.5 09/19/2014   HGB 11.3* 09/19/2014   HCT 33.5* 09/19/2014   MCV 84.4 09/19/2014   PLT 327 09/19/2014   NEUTROABS 3.6 09/19/2014     ASSESSMENT & PLAN:  Breast cancer of lower-inner quadrant of right female breast Right breast mass 5 x 4.8 x 4.2 cm, 6 enlarged axillary lymph nodes including nodes in level III location, T2 N3 M0 stage IIIc clinical stage Right breast biopsy 6:00: Invasive ductal carcinoma, right axillary lymph node biopsy high-grade carcinoma ER 0% PR 0% HER-2 negative ratio 1.68, Ki-67 80%, grade 3 Treatment Plan:  1. Neo-adjuvant chemotherapy with dose dense Adriamycin and Cytoxan 4 followed by weekly Abraxane and carboplatin 12 2. Followed by surgery 3. Followed by  radiation therapy  Current Treatment: Today is Cycle 1 day 1 adriamycin and Cytoxan  Chemo Monitoring: 1. ECHO EF 45-50%: Will get cardiology evaluation. We will add Dexrazoxone to adriamycin for cardioprotection. 2. Staging scans: Cts and Bone scans ruled out metastatic disease. 3. Anti-emetic regimen was discussed in detail   RTC in 1 week for tox check   Orders Placed This Encounter  Procedures  . Ambulatory referral to Cardiology    Referral Priority:  Routine    Referral Type:  Consultation    Referral Reason:  Specialty Services Required    Referred to Provider:  Jolaine Artist, MD    Requested Specialty:  Cardiology    Number of Visits Requested:  1   The patient has a good understanding of the overall plan. she agrees with it. she will call with any problems that may develop before the next visit here.   Rulon Eisenmenger, MD

## 2014-09-19 NOTE — Progress Notes (Signed)
  Oncology Nurse Navigator Documentation    Navigator Encounter Type: Treatment (09/19/14 1500)     Barriers/Navigation Needs: No barriers at this time (09/19/14 1500)          Met with pt during 1st chemo. Relate doing well and without complaints. Encourage pt to call with questions or needs. Received verbal understanding.

## 2014-09-19 NOTE — Telephone Encounter (Signed)
Gave avs & calendar for July & August. Sent MD message to confirm referral appointment for 08/08 @ 2:40 with Dr.Bensimhon.

## 2014-09-19 NOTE — Patient Instructions (Signed)
[facility] Discharge Instructions for Patients Receiving Chemotherapy  Today you received the following chemotherapy agents:  Adriamycin and Cytoxan  To help prevent nausea and vomiting after your treatment, we encourage you to take your nausea medication as ordered per MD.   If you develop nausea and vomiting that is not controlled by your nausea medication, call the clinic.   BELOW ARE SYMPTOMS THAT SHOULD BE REPORTED IMMEDIATELY:  *FEVER GREATER THAN 100.5 F  *CHILLS WITH OR WITHOUT FEVER  NAUSEA AND VOMITING THAT IS NOT CONTROLLED WITH YOUR NAUSEA MEDICATION  *UNUSUAL SHORTNESS OF BREATH  *UNUSUAL BRUISING OR BLEEDING  TENDERNESS IN MOUTH AND THROAT WITH OR WITHOUT PRESENCE OF ULCERS  *URINARY PROBLEMS  *BOWEL PROBLEMS  UNUSUAL RASH Items with * indicate a potential emergency and should be followed up as soon as possible.  Feel free to call the clinic you have any questions or concerns. The clinic phone number is [phone].  Please show the [place] at check-in to the Emergency Department and triage nurse.

## 2014-09-19 NOTE — Progress Notes (Signed)
Enrolled pt in the [employer] First Step program.  Faxed signed form and activated card today.  °

## 2014-09-20 ENCOUNTER — Telehealth: Payer: Self-pay

## 2014-09-20 NOTE — Telephone Encounter (Signed)
Pt is having "like a sinus headache" asked if it was OK to take claritin. Advised it was OK. States she is eating and drinking and moving bowels good. Denies fever. Encouraged her to call if any questions or concerns.

## 2014-09-20 NOTE — Telephone Encounter (Signed)
-----   Message from San Morelle, RN sent at 09/19/2014  4:42 PM EDT ----- Regarding: Gudena-Chemo f/u call Contact: [phone] First time Adriamycin/Cytoxan/Zinecard

## 2014-09-21 ENCOUNTER — Other Ambulatory Visit: Payer: 59

## 2014-09-21 ENCOUNTER — Ambulatory Visit: Payer: 59

## 2014-09-22 ENCOUNTER — Telehealth: Payer: Self-pay | Admitting: Hematology and Oncology

## 2014-09-22 ENCOUNTER — Other Ambulatory Visit: Payer: Self-pay

## 2014-09-22 DIAGNOSIS — C50919 Malignant neoplasm of unspecified site of unspecified female breast: Secondary | ICD-10-CM

## 2014-09-22 NOTE — Telephone Encounter (Signed)
s.w pt and advised on 7.15 appt....pt ok and aware °

## 2014-09-22 NOTE — Progress Notes (Signed)
Spoke with patient - confirmed d/t for appt with cardiac clinic.  Let pt know Ultrasound ordered for left lymph node and she should hear from scheduling by the end of the week.  Pt voiced understanding.

## 2014-09-23 ENCOUNTER — Other Ambulatory Visit: Payer: 59

## 2014-09-25 NOTE — Assessment & Plan Note (Signed)
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 Neo-adjuvant chemotherapy with dose dense Adriamycin and Cytoxan 4 followed by weekly Abraxane and carboplatin 12 2. Followed by surgery 3. Followed by radiation therapy  Current Treatment: Today is Cycle 1 day 8 adriamycin and Cytoxan Chemo Toxicities:  RTC in 1 week for cycle 2 

## 2014-09-26 ENCOUNTER — Telehealth: Payer: Self-pay | Admitting: Hematology and Oncology

## 2014-09-26 ENCOUNTER — Other Ambulatory Visit (HOSPITAL_BASED_OUTPATIENT_CLINIC_OR_DEPARTMENT_OTHER): Payer: 59

## 2014-09-26 ENCOUNTER — Ambulatory Visit (HOSPITAL_BASED_OUTPATIENT_CLINIC_OR_DEPARTMENT_OTHER): Payer: 59 | Admitting: Hematology and Oncology

## 2014-09-26 ENCOUNTER — Encounter: Payer: Self-pay | Admitting: Hematology and Oncology

## 2014-09-26 VITALS — BP 112/75 | HR 112 | Temp 98.7°F | Resp 18 | Ht 67.0 in | Wt 174.8 lb

## 2014-09-26 DIAGNOSIS — D701 Agranulocytosis secondary to cancer chemotherapy: Secondary | ICD-10-CM

## 2014-09-26 DIAGNOSIS — C773 Secondary and unspecified malignant neoplasm of axilla and upper limb lymph nodes: Secondary | ICD-10-CM

## 2014-09-26 DIAGNOSIS — Z171 Estrogen receptor negative status [ER-]: Secondary | ICD-10-CM | POA: Diagnosis not present

## 2014-09-26 DIAGNOSIS — C50311 Malignant neoplasm of lower-inner quadrant of right female breast: Secondary | ICD-10-CM

## 2014-09-26 DIAGNOSIS — R5383 Other fatigue: Secondary | ICD-10-CM

## 2014-09-26 DIAGNOSIS — K5900 Constipation, unspecified: Secondary | ICD-10-CM

## 2014-09-26 LAB — COMPREHENSIVE METABOLIC PANEL (CC13)
ALT: 25 U/L (ref 0–55)
AST: 21 U/L (ref 5–34)
Albumin: 3.1 g/dL — ABNORMAL LOW (ref 3.5–5.0)
Alkaline Phosphatase: 73 U/L (ref 40–150)
Anion Gap: 8 meq/L (ref 3–11)
BUN: 7.2 mg/dL (ref 7.0–26.0)
CO2: 28 meq/L (ref 22–29)
Calcium: 9.2 mg/dL (ref 8.4–10.4)
Chloride: 100 meq/L (ref 98–109)
Creatinine: 0.8 mg/dL (ref 0.6–1.1)
EGFR: >90 ml/min/1.73 m2
Glucose: 128 mg/dL (ref 70–140)
Potassium: 3.8 meq/L (ref 3.5–5.1)
Sodium: 136 meq/L (ref 136–145)
Total Bilirubin: 0.88 mg/dL (ref 0.20–1.20)
Total Protein: 7.1 g/dL (ref 6.4–8.3)

## 2014-09-26 LAB — CBC WITH DIFFERENTIAL/PLATELET
BASO%: 0.3 % (ref 0.0–2.0)
Basophils Absolute: 0 10*3/uL (ref 0.0–0.1)
EOS%: 1.9 % (ref 0.0–7.0)
Eosinophils Absolute: 0 10*3/uL (ref 0.0–0.5)
HCT: 33.8 % — ABNORMAL LOW (ref 34.8–46.6)
HGB: 10.9 g/dL — ABNORMAL LOW (ref 11.6–15.9)
LYMPH%: 71.3 % — ABNORMAL HIGH (ref 14.0–49.7)
MCH: 27.5 pg (ref 25.1–34.0)
MCHC: 32.3 g/dL (ref 31.5–36.0)
MCV: 85 fL (ref 79.5–101.0)
MONO#: 0.1 10*3/uL (ref 0.1–0.9)
MONO%: 5.5 % (ref 0.0–14.0)
NEUT#: 0.2 10*3/uL — CL (ref 1.5–6.5)
NEUT%: 21 % — ABNORMAL LOW (ref 38.4–76.8)
Platelets: 115 10*3/uL — ABNORMAL LOW (ref 145–400)
RBC: 3.98 10*6/uL (ref 3.70–5.45)
RDW: 14.8 % — ABNORMAL HIGH (ref 11.2–14.5)
WBC: 1.2 10*3/uL — AB (ref 3.9–10.3)
lymph#: 0.8 10*3/uL — ABNORMAL LOW (ref 0.9–3.3)

## 2014-09-26 NOTE — Telephone Encounter (Signed)
Gave avs & calendar for July. Sent message to adjust treatment.

## 2014-09-26 NOTE — Progress Notes (Signed)
Patient Care Team: Virginia E Fulbright, PA-C as PCP - General (Family Medicine) David Newman, MD as Consulting Physician (General Surgery) Vinay Gudena, MD as Consulting Physician (Hematology and Oncology) Sarah Squire, MD as Attending Physician (Radiation Oncology) Keisha N Martini, RN as Registered Nurse Dawn C Stuart, RN as Registered Nurse  DIAGNOSIS: Breast cancer of lower-inner quadrant of right female breast   Staging form: Breast, AJCC 7th Edition     Clinical stage from 09/07/2014: Stage IIIC (T2, N3, M0) - Unsigned       Staging comments: Staged at breast conference on 6.29.16    SUMMARY OF ONCOLOGIC HISTORY:   Breast cancer of lower-inner quadrant of right female breast   08/29/2014 Mammogram Right breast mass 5 x 4.8 x 4.2 cm, 6 enlarged axillary lymph nodes including nodes in level III location, T2 N3 M0 stage IIIc clinical stage   08/31/2014 Initial Diagnosis Right breast biopsy 6:00: Invasive ductal carcinoma, 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 Today is cycle 1 day 8 of Adriamycin and Cytoxan. She had done extremely well from chemotherapy standpoint. She did not have any nausea vomiting or mouth sores. She started noticing increasing fatigue over the past couple of days. She did have constipation for the past couple of days.  REVIEW OF SYSTEMS:   Constitutional: Denies fevers, chills or abnormal weight loss, fatigue Eyes: Denies blurriness of vision Ears, nose, mouth, throat, and face: Denies mucositis or sore throat Respiratory: Denies cough, dyspnea or  wheezes Cardiovascular: Denies palpitation, chest discomfort or lower extremity swelling Gastrointestinal:  Constipation Skin: Denies abnormal skin rashes Lymphatics: Denies new lymphadenopathy or easy bruising Neurological:Denies numbness, tingling or new weaknesses Behavioral/Psych: Mood is stable, no new changes   All other systems were reviewed with the patient and are negative.  I have reviewed the past medical history, past surgical history, social history and family history with the patient and they are unchanged from previous note.  ALLERGIES:  has No Known Allergies.  MEDICATIONS:  Current Outpatient Prescriptions  Medication Sig Dispense Refill  . dexamethasone (DECADRON) 4 MG tablet Take 1 tablet (4 mg total) by mouth 2 (two) times daily. With food- Day after chemo - 2 tabs once a day, then 2 tabs twice a day for 2 days 30 tablet 1  . HYDROcodone-acetaminophen (NORCO/VICODIN) 5-325 MG per tablet Take 1-2 tablets by mouth every 6 (six) hours as needed. 30 tablet 0  . lidocaine-prilocaine (EMLA) cream Apply to affected area once 30 g 3  . LORazepam (ATIVAN) 0.5 MG tablet Take 1 tablet (0.5 mg total) by mouth at bedtime. 30 tablet 0  . naproxen sodium (ANAPROX) 220 MG tablet Take 440 mg by mouth 3 (three) times a week.    . ondansetron (ZOFRAN) 8 MG tablet Take 1 tablet (8 mg total) by mouth 2 (two) times daily as needed. Start on the third day after chemotherapy. 30 tablet 1  . prochlorperazine (COMPAZINE) 10 MG tablet Take 1 tablet (10 mg total) by mouth every 6 (six) hours as needed (Nausea or vomiting). 30 tablet 1   No current facility-administered medications for this visit.      PHYSICAL EXAMINATION: ECOG PERFORMANCE STATUS: 1 - Symptomatic but completely ambulatory  Filed Vitals:   09/26/14 0844  BP: 112/75  Pulse: 112  Temp: 98.7 F (37.1 C)  Resp: 18   Filed Weights   09/26/14 0844  Weight: 174 lb 12.8 oz (79.289 kg)    GENERAL:alert, no distress and  comfortable SKIN: skin color, texture, turgor are normal, no rashes or significant lesions EYES: normal, Conjunctiva are pink and non-injected, sclera clear OROPHARYNX:no exudate, no erythema and lips, buccal mucosa, and tongue normal  NECK: supple, thyroid normal size, non-tender, without nodularity LYMPH:  no palpable lymphadenopathy in the cervical, axillary or inguinal LUNGS: clear to auscultation and percussion with normal breathing effort HEART: regular rate & rhythm and no murmurs and no lower extremity edema ABDOMEN:abdomen soft, non-tender and normal bowel sounds Musculoskeletal:no cyanosis of digits and no clubbing  NEURO: alert & oriented x 3 with fluent speech, no focal motor/sensory deficits  LABORATORY DATA:  I have reviewed the data as listed   Chemistry      Component Value Date/Time   NA 136 09/26/2014 0834   K 3.8 09/26/2014 0834   CO2 28 09/26/2014 0834   BUN 7.2 09/26/2014 0834   CREATININE 0.8 09/26/2014 0834      Component Value Date/Time   CALCIUM 9.2 09/26/2014 0834   ALKPHOS 73 09/26/2014 0834   AST 21 09/26/2014 0834   ALT 25 09/26/2014 0834   BILITOT 0.88 09/26/2014 0834       Lab Results  Component Value Date   WBC 1.2* 09/26/2014   HGB 10.9* 09/26/2014   HCT 33.8* 09/26/2014   MCV 85.0 09/26/2014   PLT 115* 09/26/2014   NEUTROABS 0.2* 09/26/2014    ASSESSMENT & PLAN:  Breast cancer of lower-inner quadrant of right female breast Right breast mass 5 x 4.8 x 4.2 cm, 6 enlarged axillary lymph nodes including nodes in level III location, T2 N3 M0 stage IIIc clinical stage Right breast biopsy 6:00: Invasive ductal carcinoma, 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 Neo-adjuvant chemotherapy with dose dense Adriamycin and Cytoxan 4 followed by weekly Abraxane and carboplatin 12 2. Followed by surgery 3. Followed by radiation therapy  Current  Treatment: Today is Cycle 1 day 8 adriamycin and Cytoxan Chemo Toxicities: 1. Constipation 2. Fatigue 3. Grade 3 neutropenia nadir counts check ANC 0.2, decreasing dose of chemotherapy for cycle 2 Adriamycin and Cytoxan  RTC in 1 week for cycle 2   No orders of the defined types were placed in this encounter.   The patient has a good understanding of the overall plan. she agrees with it. she will call with any problems that may develop before the next visit here.   Gudena, Vinay K, MD      

## 2014-09-27 ENCOUNTER — Other Ambulatory Visit: Payer: 59

## 2014-09-28 ENCOUNTER — Ambulatory Visit
Admission: RE | Admit: 2014-09-28 | Discharge: 2014-09-28 | Disposition: A | Discharge status: Home / Self Care | Payer: 59 | Source: Ambulatory Visit | Attending: Hematology and Oncology | Admitting: Hematology and Oncology

## 2014-09-28 ENCOUNTER — Ambulatory Visit (HOSPITAL_COMMUNITY)
Admission: RE | Admit: 2014-09-28 | Discharge: 2014-09-28 | Disposition: A | Discharge status: Home / Self Care | Payer: 59 | Source: Ambulatory Visit | Attending: Internal Medicine | Admitting: Internal Medicine

## 2014-09-28 VITALS — BP 110/62 | HR 101 | Wt 176.5 lb

## 2014-09-28 DIAGNOSIS — C50311 Malignant neoplasm of lower-inner quadrant of right female breast: Secondary | ICD-10-CM | POA: Insufficient documentation

## 2014-09-28 DIAGNOSIS — R000 Tachycardia, unspecified: Secondary | ICD-10-CM | POA: Diagnosis not present

## 2014-09-28 DIAGNOSIS — C50919 Malignant neoplasm of unspecified site of unspecified female breast: Secondary | ICD-10-CM

## 2014-09-28 NOTE — Progress Notes (Signed)
Patient ID: Brandy Douglas, female   DOB: [birth year], 44 y.o.   MRN: [id number]  Referring Physician: Lindi Adie Primary Care: Primary Cardiologist: none   HPI: Brandy Douglas is a 44 y.o. 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 She is referred to the cardio-oncology clinic by Dr Lindi Adie.   Denies any known cardiac history. No HTN or DM2. Plan per oncology- Neo-adjuvant chemotherapy with dose dense Adriamycin and Cytoxan x 4 followed by weekly Abraxane and carbospaltin x 12.  She has completed first cycle.   She denies SOB/edema/Orthopnea. Walks at work [employer], [employer]). Denies CP. In looking back through her vitals has had progressive tachycardia over past 2 months. Resting HR 80s-> low 100s  Echo 09/13/14: EF 45-50% global mild HK Lat s' 9.3 GLS -15%  SH: She is never smoker. ETOH occasionally.  FH: Dad HTN Diabetes   Labs 09/26/14 K 3.8, Cr 0.8  Review of Systems: [y] = yes, [ ]  = no   General: Weight gain [ ] ; Weight loss [ ] ; Anorexia [ ] ; Fatigue [ ] ; Fever [ ] ; Chills [ ] ; Weakness [ Y]  Cardiac: Chest pain/pressure [ ] ; Resting SOB [ ] ; Exertional SOB [ ] ; Orthopnea [ ] ; Pedal Edema [ ] ; Palpitations [ ] ; Syncope [ ] ; Presyncope [ ] ; Paroxysmal nocturnal dyspnea[ ]   Pulmonary: Cough [ ] ; Wheezing[ ] ; Hemoptysis[ ] ; Sputum [ ] ; Snoring [ ]   GI: Vomiting[ ] ; Dysphagia[ ] ; Melena[ ] ; Hematochezia [ ] ; Heartburn[ ] ; Abdominal pain [ ] ; Constipation [ ] ; Diarrhea [ ] ; BRBPR [ ]   GU: Hematuria[ ] ; Dysuria [ ] ; Nocturia[ ]   Vascular: Pain in legs with walking [ ] ; Pain in feet with lying flat [ ] ; Non-healing sores [ ] ; Stroke [ ] ; TIA [ ] ; Slurred speech [ ] ;  Neuro: Headaches[ ] ; Vertigo[ ] ; Seizures[ ] ; Paresthesias[ ] ;Blurred vision [ ] ; Diplopia [ ] ; Vision changes [ ]   Ortho/Skin: Arthritis [ ] ; Joint pain [ ] ;  Muscle pain [ ] ; Joint swelling [ ] ; Back Pain [ ] ; Rash [ ]   Psych: Depression[ ] ; Anxiety[ ]   Heme: Bleeding problems [ ] ; Clotting disorders [ ] ; Anemia [ ]   Endocrine: Diabetes [ ] ; Thyroid dysfunction[ ]    Past Medical History  Diagnosis Date  . Breast cancer 2016    R LIQ IDC; triple negative    Current Outpatient Prescriptions  Medication Sig Dispense Refill  . dexamethasone (DECADRON) 4 MG tablet Take 1 tablet (4 mg total) by mouth 2 (two) times daily. With food- Day after chemo - 2 tabs once a day, then 2 tabs twice a day for 2 days 30 tablet 1  . HYDROcodone-acetaminophen (NORCO/VICODIN) 5-325 MG per tablet Take 1-2 tablets by mouth every 6 (six) hours as needed. 30 tablet 0  . lidocaine-prilocaine (EMLA) cream Apply to affected area once 30 g 3  . LORazepam (ATIVAN) 0.5 MG tablet Take 1 tablet (0.5 mg total) by mouth at bedtime. 30 tablet 0  . ondansetron (ZOFRAN) 8 MG tablet Take 1 tablet (8 mg total) by mouth 2 (two) times daily as needed. Start on the third day after chemotherapy. 30 tablet 1  . prochlorperazine (COMPAZINE) 10 MG tablet Take 1 tablet (10 mg total) by mouth every 6 (six) hours as needed (Nausea or vomiting). 30 tablet 1  . naproxen  sodium (ANAPROX) 220 MG tablet Take 440 mg by mouth 3 (three) times a week.     No current facility-administered medications for this encounter.    No Known Allergies    History   Social History  . Marital Status: Single    Spouse Name: N/A  . Number of Children: N/A  . Years of Education: N/A   Occupational History  . Not on file.   Social History Main Topics  . Smoking status: Never Smoker   . Smokeless tobacco: Never Used  . Alcohol Use: Yes     Comment: occ/very rarely  . Drug Use: No  . Sexual Activity: Yes    Birth Control/ Protection: Surgical   Other Topics Concern  . Not on file   Social History Narrative      Family History  Problem Relation Age of Onset  . Breast cancer Paternal Grandmother  70  . Diabetes Father   . Hypertension Father   . Other Sister     has had a hysterectomy due to abnormal bleeding  . Other Sister     has had a hysterectomy due to abnormal cell findings - non-cancerous  . Diabetes Paternal Aunt   . Hypertension Paternal Aunt   . Stroke Paternal Aunt   . Heart attack Paternal Uncle   . Cancer Paternal Uncle     unknown type    Filed Vitals:   09/28/14 1417  BP: 110/62  Pulse: 101  Weight: 176 lb 8 oz (80.06 kg)  SpO2: 100%    PHYSICAL EXAM: General:  Well appearing. No respiratory difficulty HEENT: normal Neck: supple. no JVD. Carotids 2+ bilat; no bruits. No lymphadenopathy or thryomegaly appreciated. Cor: PMI nondisplaced. Regular rate & rhythm. No rubs, gallops or murmurs. Lungs: clear Abdomen: soft, nontender, nondistended. No hepatosplenomegaly. No bruits or masses. Good bowel sounds. Extremities: no cyanosis, clubbing, rash, edema Neuro: alert & oriented x 3, cranial nerves grossly intact. moves all 4 extremities w/o difficulty. Affect pleasant.  ECG - sinus tach 108 bpm otherwise normal  ASSESSMENT & PLAN:  1. L Breast CA: Stage IIIC T2 N3. Started on adriamycin. ECHO EF 45-50% . Explained the purpose of the cardio-onc clinic.  2. Sinus tachycardia  Follow up in 2 months with an ECHO   CLEGG,AMY NP-C  2:52 PM Patient seen and examined with Darrick Grinder, NP. We discussed all aspects of the encounter. I agree with the assessment and plan as stated above.   She has advanced breast CA and has recently started adriamycin. Pre-chemo echo with very mildly reduced EF 45-50% which may be norma for her, Will need to watch closely with a repeat echo in 2 months. 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 In looking at her chart she has developed a progressive tachycardia over the past few months. This is sinus tach on ECG. No clear cause. Will have her get labs at [facility] to include CBC and TFTs. Can titrate b-blocker  as needed. Will see back with repeat echo in 6-8 weeks.  Brazil Voytko,MD 12:13 AM

## 2014-09-28 NOTE — Patient Instructions (Signed)
Your physician has requested that you have an echocardiogram. Echocardiography is a painless test that uses sound waves to create images of your heart. It provides your doctor with information about the size and shape of your heart and how well your heart's chambers and valves are working. This procedure takes approximately one hour. There are no restrictions for this procedure. IN 6 WEEKS  Your physician recommends that you schedule a follow-up appointment in: 2 months

## 2014-09-30 ENCOUNTER — Telehealth: Payer: Self-pay | Admitting: Genetic Counselor

## 2014-09-30 ENCOUNTER — Ambulatory Visit: Payer: Self-pay | Admitting: Genetic Counselor

## 2014-09-30 DIAGNOSIS — Z1379 Encounter for other screening for genetic and chromosomal anomalies: Secondary | ICD-10-CM | POA: Insufficient documentation

## 2014-09-30 NOTE — Telephone Encounter (Signed)
Discussed with Brandy Douglas that her genetic test results were negative for changes within any of 8 genes that would cause an increased risk for breast and related cancers.  Additionally, no variants of uncertain significance (VUSs) were found.  We still do not have an explanation for why she was diagnosed with breast cancer at a young age, but most cancers are not genetic.  Future cancer screening for Brandy Douglas and her family members would be based upon the personal and family history of cancer.  Women in the family are at a somewhat increased risk for breast cancer based on the family history.  Thus, they should be getting mammograms beginning at the age of 45 (48 even as young as 15 based on the family history of breast cancer at 49 in Brandy Douglas's paternal grandmother).  Family members, including Brandy Douglas's three sisters, her daughter, and her niece should make their doctors aware of the family history of breast cancer so that they may have appropriate/increased breast screening in the future.

## 2014-09-30 NOTE — Progress Notes (Signed)
GENETIC TEST RESULTS  HPI: Brandy Douglas was previously seen in the [facility] due to a personal history of triple negative breast cancer at 40, family history of breast cancer in her paternal grandmother at 25, and concerns regarding a hereditary predisposition to cancer. Please refer to our prior cancer genetics clinic note from September 13, 2014 for more information regarding Brandy Douglas's medical, social and family histories, and our assessment and recommendations, at the time. Brandy Douglas recent genetic test results were disclosed to her, as were recommendations warranted by these results. These results and recommendations are discussed in more detail below.  GENETIC TEST RESULTS: At the time of Brandy Douglas's visit on 09/13/14 , we recommended she pursue genetic testing of the [facility] gene panel through [employer] [facility], [place]).  The BRCAplus-Expanded gene panel offered by [employer] includes sequencing and deletion/duplication analysis for the following 8 genes: ATM, BRCA1, BRCA2, CDH1, CHEK2, PALB2, PTEN, and TP53.  Those results are now back, the report date of which is September 29, 2014.  Genetic testing was normal, and did not reveal a deleterious mutation in these genes.  Additionally, no variants of uncertain significance (VUSs) were found.  The test report will be scanned into EPIC and will be located under the Media tab.   We discussed with Brandy Douglas that since the current genetic testing is not perfect, it is possible there may be a gene mutation in one of these genes that current testing cannot detect, but that chance is small. We also discussed, that it is possible that another gene that has not yet been discovered, or that we have not yet tested, is responsible for the cancer diagnoses in the family, and it is, therefore, important to remain in touch with cancer genetics in the future so that we can continue to offer [name] the most up to  date genetic testing.   CANCER SCREENING RECOMMENDATIONS: Though we still do not have an explanation for the personal or family history of breast cancer, this result is reassuring and indicates that Brandy Douglas likely does not have an increased risk for a future cancer due to a mutation in one of these genes. This normal test also suggests that Brandy Douglas's cancer was most likely not due to an inherited predisposition associated with one of these genes.  Most cancers happen by chance and this negative test suggests that her cancer falls into this category.  We, therefore, recommended she continue to follow the cancer management and screening guidelines provided by her oncology and primary healthcare providers.   RECOMMENDATIONS FOR FAMILY MEMBERS: Women in this family might be at some increased risk of developing cancer, over the general population risk, simply due to the family history of cancer. We recommended women in this family have a yearly mammogram beginning at age 14, or 45 years younger than the earliest onset of cancer, an an annual clinical breast exam, and perform monthly breast self-exams.  This means that, at least by current guidelines, Brandy Douglas's daughter should begin mammogram screening by the age of 28 or potentially at the age of 56 based on the age of diagnosis for Ms. Cobb's paternal grandmother.  The same holds true for Brandy Douglas's niece.  Women in this family should also have a gynecological exam as recommended by their primary provider. All family members should have a colonoscopy by age 73.  Brandy Douglas family members, in particular her three sisters, should make their healthcare providers  aware of the family history of cancer, so that they may be screened appropriately in the future.   FOLLOW-UP: Lastly, we discussed with Brandy Douglas that cancer genetics is a rapidly advancing field and it is possible that new genetic tests will be appropriate for her and/or her family members  in the future. We encouraged her to remain in contact with cancer genetics on an annual basis so we can update her personal and family histories and let her know of advances in cancer genetics that may benefit this family.   Our contact number was provided. Brandy Douglas questions were answered to her satisfaction, and she knows she is welcome to call us at anytime with additional questions or concerns.   Jeanine Luz, MS Genetic Counselor [email or web address] Phone: [phone]

## 2014-10-03 ENCOUNTER — Encounter: Payer: Self-pay | Admitting: *Deleted

## 2014-10-03 ENCOUNTER — Ambulatory Visit (HOSPITAL_BASED_OUTPATIENT_CLINIC_OR_DEPARTMENT_OTHER): Payer: 59 | Admitting: Hematology and Oncology

## 2014-10-03 ENCOUNTER — Ambulatory Visit (HOSPITAL_BASED_OUTPATIENT_CLINIC_OR_DEPARTMENT_OTHER): Payer: 59

## 2014-10-03 ENCOUNTER — Encounter: Payer: Self-pay | Admitting: Hematology and Oncology

## 2014-10-03 ENCOUNTER — Other Ambulatory Visit (HOSPITAL_BASED_OUTPATIENT_CLINIC_OR_DEPARTMENT_OTHER): Payer: 59

## 2014-10-03 ENCOUNTER — Ambulatory Visit: Payer: 59 | Admitting: Hematology and Oncology

## 2014-10-03 ENCOUNTER — Telehealth: Payer: Self-pay | Admitting: Hematology and Oncology

## 2014-10-03 ENCOUNTER — Other Ambulatory Visit: Payer: 59

## 2014-10-03 VITALS — BP 120/76 | HR 98 | Temp 98.2°F | Resp 18 | Ht 67.0 in | Wt 178.0 lb

## 2014-10-03 DIAGNOSIS — R5383 Other fatigue: Secondary | ICD-10-CM

## 2014-10-03 DIAGNOSIS — Z5111 Encounter for antineoplastic chemotherapy: Secondary | ICD-10-CM

## 2014-10-03 DIAGNOSIS — Z5189 Encounter for other specified aftercare: Secondary | ICD-10-CM

## 2014-10-03 DIAGNOSIS — K5900 Constipation, unspecified: Secondary | ICD-10-CM

## 2014-10-03 DIAGNOSIS — C50311 Malignant neoplasm of lower-inner quadrant of right female breast: Secondary | ICD-10-CM

## 2014-10-03 DIAGNOSIS — D701 Agranulocytosis secondary to cancer chemotherapy: Secondary | ICD-10-CM

## 2014-10-03 DIAGNOSIS — Z171 Estrogen receptor negative status [ER-]: Secondary | ICD-10-CM | POA: Diagnosis not present

## 2014-10-03 DIAGNOSIS — C773 Secondary and unspecified malignant neoplasm of axilla and upper limb lymph nodes: Secondary | ICD-10-CM | POA: Diagnosis not present

## 2014-10-03 DIAGNOSIS — D6481 Anemia due to antineoplastic chemotherapy: Secondary | ICD-10-CM

## 2014-10-03 LAB — CBC WITH DIFFERENTIAL/PLATELET
BASO%: 0.3 % (ref 0.0–2.0)
Basophils Absolute: 0 10*3/uL (ref 0.0–0.1)
EOS%: 0.3 % (ref 0.0–7.0)
Eosinophils Absolute: 0 10*3/uL (ref 0.0–0.5)
HEMATOCRIT: 32.2 % — AB (ref 34.8–46.6)
HGB: 10.3 g/dL — ABNORMAL LOW (ref 11.6–15.9)
LYMPH%: 29.7 % (ref 14.0–49.7)
MCH: 28.1 pg (ref 25.1–34.0)
MCHC: 32 g/dL (ref 31.5–36.0)
MCV: 87.7 fL (ref 79.5–101.0)
MONO#: 0.4 10*3/uL (ref 0.1–0.9)
MONO%: 7.1 % (ref 0.0–14.0)
NEUT%: 62.6 % (ref 38.4–76.8)
NEUTROS ABS: 3.7 10*3/uL (ref 1.5–6.5)
PLATELETS: 287 10*3/uL (ref 145–400)
RBC: 3.67 10*6/uL — AB (ref 3.70–5.45)
RDW: 14.5 % (ref 11.2–14.5)
WBC: 6 10*3/uL (ref 3.9–10.3)
lymph#: 1.8 10*3/uL (ref 0.9–3.3)

## 2014-10-03 LAB — COMPREHENSIVE METABOLIC PANEL (CC13)
ALT: 17 U/L (ref 0–55)
ANION GAP: 9 meq/L (ref 3–11)
AST: 12 U/L (ref 5–34)
Albumin: 3 g/dL — ABNORMAL LOW (ref 3.5–5.0)
Alkaline Phosphatase: 67 U/L (ref 40–150)
BUN: 4.4 mg/dL — ABNORMAL LOW (ref 7.0–26.0)
CHLORIDE: 110 meq/L — AB (ref 98–109)
CO2: 22 mEq/L (ref 22–29)
CREATININE: 0.7 mg/dL (ref 0.6–1.1)
Calcium: 8.6 mg/dL (ref 8.4–10.4)
EGFR: >90 mL/min/{1.73_m2} (ref >90)
Glucose: 106 mg/dl (ref 70–140)
POTASSIUM: 3.7 meq/L (ref 3.5–5.1)
SODIUM: 141 meq/L (ref 136–145)
TOTAL PROTEIN: 6.7 g/dL (ref 6.4–8.3)
Total Bilirubin: 0.2 mg/dL (ref 0.20–1.20)

## 2014-10-03 LAB — T4: T4 TOTAL: 7.8 ug/dL (ref 4.5–12.0)

## 2014-10-03 LAB — T3, FREE: T3, Free: 3.4 pg/mL (ref 2.3–4.2)

## 2014-10-03 LAB — TSH CHCC: TSH: 0.948 m(IU)/L (ref 0.308–3.960)

## 2014-10-03 MED ORDER — SODIUM CHLORIDE 0.9 % IV SOLN
Freq: Once | INTRAVENOUS | Status: AC
  Administered 2014-10-03: 09:00:00 via INTRAVENOUS

## 2014-10-03 MED ORDER — SODIUM CHLORIDE 0.9 % IV SOLN
500.0000 mg/m2 | Freq: Once | INTRAVENOUS | Status: AC
  Administered 2014-10-03: 980 mg via INTRAVENOUS
  Filled 2014-10-03: qty 49

## 2014-10-03 MED ORDER — PALONOSETRON HCL INJECTION 0.25 MG/5ML
INTRAVENOUS | Status: AC
  Filled 2014-10-03: qty 5

## 2014-10-03 MED ORDER — PALONOSETRON HCL INJECTION 0.25 MG/5ML
0.2500 mg | Freq: Once | INTRAVENOUS | Status: AC
  Administered 2014-10-03: 0.25 mg via INTRAVENOUS

## 2014-10-03 MED ORDER — PEGFILGRASTIM 6 MG/0.6ML ~~LOC~~ PSKT
6.0000 mg | PREFILLED_SYRINGE | Freq: Once | SUBCUTANEOUS | Status: AC
  Administered 2014-10-03: 6 mg via SUBCUTANEOUS
  Filled 2014-10-03: qty 0.6

## 2014-10-03 MED ORDER — LACTATED RINGERS IV SOLN
495.0000 mg/m2 | Freq: Once | INTRAVENOUS | Status: AC
  Administered 2014-10-03: 980 mg via INTRAVENOUS
  Filled 2014-10-03: qty 98

## 2014-10-03 MED ORDER — HEPARIN SOD (PORK) LOCK FLUSH 100 UNIT/ML IV SOLN
500.0000 [IU] | Freq: Once | INTRAVENOUS | Status: AC | PRN
  Administered 2014-10-03: 500 [IU]
  Filled 2014-10-03: qty 5

## 2014-10-03 MED ORDER — DOXORUBICIN HCL CHEMO IV INJECTION 2 MG/ML
50.0000 mg/m2 | Freq: Once | INTRAVENOUS | Status: AC
  Administered 2014-10-03: 98 mg via INTRAVENOUS
  Filled 2014-10-03: qty 49

## 2014-10-03 MED ORDER — SODIUM CHLORIDE 0.9 % IJ SOLN
10.0000 mL | INTRAMUSCULAR | Status: DC | PRN
  Administered 2014-10-03: 10 mL
  Filled 2014-10-03: qty 10

## 2014-10-03 MED ORDER — SODIUM CHLORIDE 0.9 % IV SOLN
Freq: Once | INTRAVENOUS | Status: AC
  Administered 2014-10-03: 09:00:00 via INTRAVENOUS
  Filled 2014-10-03: qty 5

## 2014-10-03 NOTE — Telephone Encounter (Signed)
No pof sent yet,per patient two week appointment needed with chemo,done and avs printed

## 2014-10-03 NOTE — Progress Notes (Signed)
Brisk blood return noted before during and after vesicant pushes. Pt denied any discomfort at port site. Site unremarkable.

## 2014-10-03 NOTE — Addendum Note (Signed)
Addended by: Prentiss Bells on: 10/03/2014 08:38 AM   Modules accepted: Medications

## 2014-10-03 NOTE — Patient Instructions (Signed)
[facility] Discharge Instructions for Patients Receiving Chemotherapy  Today you received the following chemotherapy agents: Zinecard, Adriamycin, Cytoxan   To help prevent nausea and vomiting after your treatment, we encourage you to take your nausea medication as directed.    If you develop nausea and vomiting that is not controlled by your nausea medication, call the clinic.   BELOW ARE SYMPTOMS THAT SHOULD BE REPORTED IMMEDIATELY:  *FEVER GREATER THAN 100.5 F  *CHILLS WITH OR WITHOUT FEVER  NAUSEA AND VOMITING THAT IS NOT CONTROLLED WITH YOUR NAUSEA MEDICATION  *UNUSUAL SHORTNESS OF BREATH  *UNUSUAL BRUISING OR BLEEDING  TENDERNESS IN MOUTH AND THROAT WITH OR WITHOUT PRESENCE OF ULCERS  *URINARY PROBLEMS  *BOWEL PROBLEMS  UNUSUAL RASH Items with * indicate a potential emergency and should be followed up as soon as possible.  Feel free to call the clinic you have any questions or concerns. The clinic phone number is [phone].  Please show the [facility] at check-in to the Emergency Department and triage nurse.

## 2014-10-03 NOTE — Assessment & Plan Note (Signed)
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 Neo-adjuvant chemotherapy with dose dense Adriamycin and Cytoxan 4 followed by weekly Abraxane and carboplatin 12 2. Followed by surgery 3. Followed by radiation therapy  Current Treatment: Today is Cycle 2 day 1 adriamycin and Cytoxan Chemo Toxicities: 1. Constipation 2. Fatigue 3. Grade 3 neutropenia nadir counts check ANC 0.2, decreasing dose of chemotherapy for cycle 2 Adriamycin and Cytoxan  RTC in 2 weeks for cycle 3

## 2014-10-03 NOTE — Progress Notes (Signed)
Patient Care Team: Elisabeth Cara, PA-C as PCP - General (Family Medicine) [name], MD as Consulting Physician (General Surgery) Nicholas Lose, MD as Consulting Physician (Hematology and Oncology) Eppie Gibson, MD as Attending Physician (Radiation Oncology) Rockwell Germany, RN as Registered Nurse Mauro Kaufmann, RN as Registered Nurse  DIAGNOSIS: Breast cancer of lower-inner quadrant of right female breast   Staging form: Breast, AJCC 7th Edition     Clinical stage from 09/07/2014: Stage IIIC (T2, N3, M0) - Unsigned       Staging comments: Staged at breast conference on 6.29.16  SUMMARY OF ONCOLOGIC HISTORY:   Breast cancer of lower-inner quadrant of right female breast   08/29/2014 Mammogram Right breast mass 5 x 4.8 x 4.2 cm, 6 enlarged axillary lymph nodes including nodes in level III location, T2 N3 M0 stage IIIc clinical stage   08/31/2014 Initial Diagnosis Right breast biopsy 6:00: Invasive ductal carcinoma, right axillary lymph node biopsy high-grade carcinoma ER 0% PR 0% HER-2 negative ratio 1.68, Ki-67 80%, grade 3   09/19/2014 -  Neo-Adjuvant Chemotherapy Adriamycin Cytoxan dose dense 4 followed by Abraxane and carboplatin weekly 12    CHIEF COMPLIANT: Cycle 2 dose dense Adriamycin and Cytoxan  INTERVAL HISTORY: Brandy Douglas is a 44 year old with above-mentioned history of right breast cancer currently neo-adjuvant chemotherapy. Today is cycle 2 of chemotherapy. After cycle 1 her major complaint was constipation. She had to go to the emergency room with that performed enema. She's been taking Miralax daily and that has been helping. She has noted increased hair loss. Her energy levels are stable.  REVIEW OF SYSTEMS:   Constitutional: Denies fevers, chills or abnormal weight loss Eyes: Denies blurriness of vision Ears, nose, mouth, throat, and face: Denies mucositis or sore throat Respiratory: Denies cough, dyspnea or wheezes Cardiovascular: Denies palpitation, chest  discomfort or lower extremity swelling Gastrointestinal:  Denies nausea, heartburn or change in bowel habits Skin: Denies abnormal skin rashes Lymphatics: Denies new lymphadenopathy or easy bruising Neurological:Denies numbness, tingling or new weaknesses Behavioral/Psych: Mood is stable, no new changes   All other systems were reviewed with the patient and are negative.  I have reviewed the past medical history, past surgical history, social history and family history with the patient and they are unchanged from previous note.  ALLERGIES:  has No Known Allergies.  MEDICATIONS:  Current Outpatient Prescriptions  Medication Sig Dispense Refill  . dexamethasone (DECADRON) 4 MG tablet Take 1 tablet (4 mg total) by mouth 2 (two) times daily. With food- Day after chemo - 2 tabs once a day, then 2 tabs twice a day for 2 days 30 tablet 1  . HYDROcodone-acetaminophen (NORCO/VICODIN) 5-325 MG per tablet Take 1-2 tablets by mouth every 6 (six) hours as needed. 30 tablet 0  . lidocaine-prilocaine (EMLA) cream Apply to affected area once 30 g 3  . LORazepam (ATIVAN) 0.5 MG tablet Take 1 tablet (0.5 mg total) by mouth at bedtime. 30 tablet 0  . naproxen sodium (ANAPROX) 220 MG tablet Take 440 mg by mouth 3 (three) times a week.    . ondansetron (ZOFRAN) 8 MG tablet Take 1 tablet (8 mg total) by mouth 2 (two) times daily as needed. Start on the third day after chemotherapy. 30 tablet 1  . prochlorperazine (COMPAZINE) 10 MG tablet Take 1 tablet (10 mg total) by mouth every 6 (six) hours as needed (Nausea or vomiting). 30 tablet 1   No current facility-administered medications for this visit.  PHYSICAL EXAMINATION: ECOG PERFORMANCE STATUS: 1 - Symptomatic but completely ambulatory  Filed Vitals:   10/03/14 0810  BP: 120/76  Pulse: 98  Temp: 98.2 F (36.8 C)  Resp: 18   Filed Weights   10/03/14 0810  Weight: 178 lb (80.74 kg)    GENERAL:alert, no distress and comfortable SKIN: skin  color, texture, turgor are normal, no rashes or significant lesions EYES: normal, Conjunctiva are pink and non-injected, sclera clear OROPHARYNX:no exudate, no erythema and lips, buccal mucosa, and tongue normal  NECK: supple, thyroid normal size, non-tender, without nodularity LYMPH:  no palpable lymphadenopathy in the cervical, axillary or inguinal LUNGS: clear to auscultation and percussion with normal breathing effort HEART: regular rate & rhythm and no murmurs and no lower extremity edema ABDOMEN:abdomen soft, non-tender and normal bowel sounds Musculoskeletal:no cyanosis of digits and no clubbing  NEURO: alert & oriented x 3 with fluent speech, no focal motor/sensory deficits   LABORATORY DATA:  I have reviewed the data as listed   Chemistry      Component Value Date/Time   NA 136 09/26/2014 0834   K 3.8 09/26/2014 0834   CO2 28 09/26/2014 0834   BUN 7.2 09/26/2014 0834   CREATININE 0.8 09/26/2014 0834      Component Value Date/Time   CALCIUM 9.2 09/26/2014 0834   ALKPHOS 73 09/26/2014 0834   AST 21 09/26/2014 0834   ALT 25 09/26/2014 0834   BILITOT 0.88 09/26/2014 0834       Lab Results  Component Value Date   WBC 6.0 10/03/2014   HGB 10.3* 10/03/2014   HCT 32.2* 10/03/2014   MCV 87.7 10/03/2014   PLT 287 10/03/2014   NEUTROABS 3.7 10/03/2014   ASSESSMENT & PLAN:  Breast cancer of lower-inner quadrant of right female breast Right breast mass 5 x 4.8 x 4.2 cm, 6 enlarged axillary lymph nodes including nodes in level III location, T2 N3 M0 stage IIIc clinical stage Right breast biopsy 6:00: Invasive ductal carcinoma, 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 Neo-adjuvant chemotherapy with dose dense Adriamycin and Cytoxan 4 followed by weekly Abraxane and carboplatin 12 2. Followed by surgery 3. Followed by radiation therapy  Current Treatment: Today is Cycle 2 day 1  adriamycin and Cytoxan Chemo Toxicities: 1. Constipation 2. Fatigue 3. Grade 3 neutropenia nadir counts check ANC 0.2, decreasing dose of chemotherapy for cycle 2 Adriamycin and Cytoxan 4. Anemia due to chemotherapy: We'll monitor this hemoglobin 10.3 on 10/03/2014  RTC in 2 weeks for cycle 3  No orders of the defined types were placed in this encounter.   The patient has a good understanding of the overall plan. she agrees with it. she will call with any problems that may develop before the next visit here.   Rulon Eisenmenger, MD

## 2014-10-04 ENCOUNTER — Encounter: Payer: Self-pay | Admitting: Hematology and Oncology

## 2014-10-04 NOTE — Progress Notes (Signed)
Rcvd referral from [facility], pt needs assistance with personal bills.  I spoke w/ pt about the [employer] and what's needed to apply.  She will fax her ck stubs on 10/05/14.  I will also give her applications for ACS, [employer] and the [employer] for additional assistance.

## 2014-10-05 ENCOUNTER — Other Ambulatory Visit: Payer: 59

## 2014-10-05 ENCOUNTER — Ambulatory Visit: Payer: 59

## 2014-10-06 ENCOUNTER — Encounter: Payer: Self-pay | Admitting: *Deleted

## 2014-10-06 NOTE — Progress Notes (Signed)
[facility] Work  Clinical Social Work was referred by patient navigator for assessment of psychosocial needs due to financial concerns.  Clinical Social Worker contacted patient at home to offer support and assess for needs.  CSW left message with return call information and also contact information for financial counselor. CSW awaits return call and will try to see pt at future appointments as well.   Loren Racer, [place] Worker [place]  [place] Phone: [phone] Fax: [phone]

## 2014-10-10 ENCOUNTER — Ambulatory Visit (HOSPITAL_COMMUNITY): Payer: 59

## 2014-10-10 ENCOUNTER — Encounter: Payer: Self-pay | Admitting: Hematology and Oncology

## 2014-10-10 NOTE — Progress Notes (Signed)
Pt is approved for the $1000 [employer] grant.  

## 2014-10-17 ENCOUNTER — Ambulatory Visit (HOSPITAL_COMMUNITY): Payer: 59

## 2014-10-17 ENCOUNTER — Encounter: Payer: Self-pay | Admitting: Hematology and Oncology

## 2014-10-17 ENCOUNTER — Ambulatory Visit (HOSPITAL_BASED_OUTPATIENT_CLINIC_OR_DEPARTMENT_OTHER): Payer: 59

## 2014-10-17 ENCOUNTER — Other Ambulatory Visit (HOSPITAL_BASED_OUTPATIENT_CLINIC_OR_DEPARTMENT_OTHER): Payer: 59

## 2014-10-17 ENCOUNTER — Encounter: Payer: Self-pay | Admitting: *Deleted

## 2014-10-17 ENCOUNTER — Telehealth: Payer: Self-pay | Admitting: Hematology and Oncology

## 2014-10-17 ENCOUNTER — Ambulatory Visit (HOSPITAL_BASED_OUTPATIENT_CLINIC_OR_DEPARTMENT_OTHER): Payer: 59 | Admitting: Hematology and Oncology

## 2014-10-17 VITALS — BP 127/73 | HR 105 | Temp 98.5°F | Resp 18 | Ht 67.0 in | Wt 173.6 lb

## 2014-10-17 DIAGNOSIS — K5900 Constipation, unspecified: Secondary | ICD-10-CM

## 2014-10-17 DIAGNOSIS — Z5111 Encounter for antineoplastic chemotherapy: Secondary | ICD-10-CM | POA: Diagnosis not present

## 2014-10-17 DIAGNOSIS — Z171 Estrogen receptor negative status [ER-]: Secondary | ICD-10-CM

## 2014-10-17 DIAGNOSIS — C773 Secondary and unspecified malignant neoplasm of axilla and upper limb lymph nodes: Secondary | ICD-10-CM | POA: Diagnosis not present

## 2014-10-17 DIAGNOSIS — C50311 Malignant neoplasm of lower-inner quadrant of right female breast: Secondary | ICD-10-CM

## 2014-10-17 DIAGNOSIS — L608 Other nail disorders: Secondary | ICD-10-CM

## 2014-10-17 DIAGNOSIS — R5383 Other fatigue: Secondary | ICD-10-CM

## 2014-10-17 DIAGNOSIS — Z5189 Encounter for other specified aftercare: Secondary | ICD-10-CM | POA: Diagnosis not present

## 2014-10-17 DIAGNOSIS — D701 Agranulocytosis secondary to cancer chemotherapy: Secondary | ICD-10-CM

## 2014-10-17 DIAGNOSIS — D6481 Anemia due to antineoplastic chemotherapy: Secondary | ICD-10-CM

## 2014-10-17 LAB — COMPREHENSIVE METABOLIC PANEL (CC13)
ALT: 23 U/L (ref 0–55)
AST: 15 U/L (ref 5–34)
Albumin: 3.3 g/dL — ABNORMAL LOW (ref 3.5–5.0)
Alkaline Phosphatase: 87 U/L (ref 40–150)
Anion Gap: 9 mEq/L (ref 3–11)
BUN: 9.2 mg/dL (ref 7.0–26.0)
CALCIUM: 9 mg/dL (ref 8.4–10.4)
CO2: 26 mEq/L (ref 22–29)
Chloride: 108 mEq/L (ref 98–109)
Creatinine: 0.8 mg/dL (ref 0.6–1.1)
EGFR: >90 mL/min/{1.73_m2} (ref >90)
GLUCOSE: 114 mg/dL (ref 70–140)
Potassium: 3.8 mEq/L (ref 3.5–5.1)
SODIUM: 143 meq/L (ref 136–145)
TOTAL PROTEIN: 7.2 g/dL (ref 6.4–8.3)
Total Bilirubin: <0.2 mg/dL (ref 0.20–1.20)

## 2014-10-17 LAB — CBC WITH DIFFERENTIAL/PLATELET
BASO%: 0.3 % (ref 0.0–2.0)
Basophils Absolute: 0 10*3/uL (ref 0.0–0.1)
EOS ABS: 0 10*3/uL (ref 0.0–0.5)
EOS%: 0 % (ref 0.0–7.0)
HEMATOCRIT: 33.3 % — AB (ref 34.8–46.6)
HGB: 10.9 g/dL — ABNORMAL LOW (ref 11.6–15.9)
LYMPH#: 1.8 10*3/uL (ref 0.9–3.3)
LYMPH%: 17.2 % (ref 14.0–49.7)
MCH: 28.8 pg (ref 25.1–34.0)
MCHC: 32.7 g/dL (ref 31.5–36.0)
MCV: 87.9 fL (ref 79.5–101.0)
MONO#: 0.8 10*3/uL (ref 0.1–0.9)
MONO%: 7.7 % (ref 0.0–14.0)
NEUT#: 7.8 10*3/uL — ABNORMAL HIGH (ref 1.5–6.5)
NEUT%: 74.8 % (ref 38.4–76.8)
Platelets: 180 10*3/uL (ref 145–400)
RBC: 3.79 10*6/uL (ref 3.70–5.45)
RDW: 14.9 % — AB (ref 11.2–14.5)
WBC: 10.4 10*3/uL — AB (ref 3.9–10.3)

## 2014-10-17 MED ORDER — PALONOSETRON HCL INJECTION 0.25 MG/5ML
0.2500 mg | Freq: Once | INTRAVENOUS | Status: AC
  Administered 2014-10-17: 0.25 mg via INTRAVENOUS

## 2014-10-17 MED ORDER — SODIUM CHLORIDE 0.9 % IJ SOLN
10.0000 mL | INTRAMUSCULAR | Status: DC | PRN
  Administered 2014-10-17: 10 mL
  Filled 2014-10-17: qty 10

## 2014-10-17 MED ORDER — DEXRAZOXANE INJECTION 500 MG
497.0000 mg/m2 | Freq: Once | INTRAVENOUS | Status: AC
  Administered 2014-10-17: 980 mg via INTRAVENOUS
  Filled 2014-10-17: qty 98

## 2014-10-17 MED ORDER — SODIUM CHLORIDE 0.9 % IV SOLN
Freq: Once | INTRAVENOUS | Status: AC
  Administered 2014-10-17: 10:00:00 via INTRAVENOUS
  Filled 2014-10-17: qty 5

## 2014-10-17 MED ORDER — PEGFILGRASTIM 6 MG/0.6ML ~~LOC~~ PSKT
6.0000 mg | PREFILLED_SYRINGE | Freq: Once | SUBCUTANEOUS | Status: AC
  Administered 2014-10-17: 6 mg via SUBCUTANEOUS
  Filled 2014-10-17: qty 0.6

## 2014-10-17 MED ORDER — DOXORUBICIN HCL CHEMO IV INJECTION 2 MG/ML
50.0000 mg/m2 | Freq: Once | INTRAVENOUS | Status: AC
  Administered 2014-10-17: 98 mg via INTRAVENOUS
  Filled 2014-10-17: qty 49

## 2014-10-17 MED ORDER — HEPARIN SOD (PORK) LOCK FLUSH 100 UNIT/ML IV SOLN
500.0000 [IU] | Freq: Once | INTRAVENOUS | Status: AC | PRN
  Administered 2014-10-17: 500 [IU]
  Filled 2014-10-17: qty 5

## 2014-10-17 MED ORDER — SODIUM CHLORIDE 0.9 % IV SOLN
Freq: Once | INTRAVENOUS | Status: AC
  Administered 2014-10-17: 09:00:00 via INTRAVENOUS

## 2014-10-17 MED ORDER — SODIUM CHLORIDE 0.9 % IV SOLN
500.0000 mg/m2 | Freq: Once | INTRAVENOUS | Status: AC
  Administered 2014-10-17: 980 mg via INTRAVENOUS
  Filled 2014-10-17: qty 49

## 2014-10-17 MED ORDER — PALONOSETRON HCL INJECTION 0.25 MG/5ML
INTRAVENOUS | Status: AC
  Filled 2014-10-17: qty 5

## 2014-10-17 NOTE — Progress Notes (Signed)
Patient Care Team: Elisabeth Cara, PA-C as PCP - General (Family Medicine) [name], MD as Consulting Physician (General Surgery) Nicholas Lose, MD as Consulting Physician (Hematology and Oncology) Eppie Gibson, MD as Attending Physician (Radiation Oncology) Rockwell Germany, RN as Registered Nurse Mauro Kaufmann, RN as Registered Nurse  DIAGNOSIS: Breast cancer of lower-inner quadrant of right female breast   Staging form: Breast, AJCC 7th Edition     Clinical stage from 09/07/2014: Stage IIIC (T2, N3, M0) - Unsigned       Staging comments: Staged at breast conference on 6.29.16    SUMMARY OF ONCOLOGIC HISTORY:   Breast cancer of lower-inner quadrant of right female breast   08/29/2014 Mammogram Right breast mass 5 x 4.8 x 4.2 cm, 6 enlarged axillary lymph nodes including nodes in level III location, T2 N3 M0 stage IIIc clinical stage   08/31/2014 Initial Diagnosis Right breast biopsy 6:00: Invasive ductal carcinoma, right axillary lymph node biopsy high-grade carcinoma ER 0% PR 0% HER-2 negative ratio 1.68, Ki-67 80%, grade 3   09/19/2014 -  Neo-Adjuvant Chemotherapy Adriamycin Cytoxan dose dense 4 followed by Abraxane and carboplatin weekly 12    CHIEF COMPLIANT: Cycle 3 Adriamycin and Cytoxan  INTERVAL HISTORY: Brandy Douglas is a 26 over the above-mentioned history of right-sided breast cancer currently neo-adjuvant chemotherapy. Today cycle 3 of dose dense Adriamycin and Cytoxan. She is tolerating chemotherapy fairly well . She does not have any more constipation she is glad about it. She has some minor upper respiratory infection. She denies any fevers or chills.  REVIEW OF SYSTEMS:   Constitutional: Denies fevers, chills or abnormal weight loss Eyes: Denies blurriness of vision Ears, nose, mouth, throat, and face: Denies mucositis or sore throat Respiratory: Denies cough, dyspnea or wheezes Cardiovascular: Denies palpitation, chest discomfort or lower extremity  swelling Gastrointestinal:  Denies nausea, heartburn or change in bowel habits Skin: Nail bed discoloration, skin darkening on the hands Lymphatics: Denies new lymphadenopathy or easy bruising Neurological:Denies numbness, tingling or new weaknesses Behavioral/Psych: Mood is stable, no new changes   All other systems were reviewed with the patient and are negative.  I have reviewed the past medical history, past surgical history, social history and family history with the patient and they are unchanged from previous note.  ALLERGIES:  has No Known Allergies.  MEDICATIONS:  Current Outpatient Prescriptions  Medication Sig Dispense Refill  . dexamethasone (DECADRON) 4 MG tablet Take 1 tablet (4 mg total) by mouth 2 (two) times daily. With food- Day after chemo - 2 tabs once a day, then 2 tabs twice a day for 2 days 30 tablet 1  . HYDROcodone-acetaminophen (NORCO/VICODIN) 5-325 MG per tablet Take 1-2 tablets by mouth every 6 (six) hours as needed. 30 tablet 0  . lidocaine-prilocaine (EMLA) cream Apply to affected area once 30 g 3  . LORazepam (ATIVAN) 0.5 MG tablet Take 1 tablet (0.5 mg total) by mouth at bedtime. 30 tablet 0  . naproxen sodium (RA NAPROXEN SODIUM) 220 MG tablet Take 440 mg by mouth.    . ondansetron (ZOFRAN) 8 MG tablet Take 1 tablet (8 mg total) by mouth 2 (two) times daily as needed. Start on the third day after chemotherapy. 30 tablet 1  . prochlorperazine (COMPAZINE) 10 MG tablet Take 1 tablet (10 mg total) by mouth every 6 (six) hours as needed (Nausea or vomiting). 30 tablet 1   No current facility-administered medications for this visit.    PHYSICAL EXAMINATION: ECOG PERFORMANCE  STATUS: 1 - Symptomatic but completely ambulatory  There were no vitals filed for this visit. There were no vitals filed for this visit.  GENERAL:alert, no distress and comfortable SKIN: skin color, texture, turgor are normal, no rashes or significant lesions EYES: normal, Conjunctiva  are pink and non-injected, sclera clear OROPHARYNX:no exudate, no erythema and lips, buccal mucosa, and tongue normal  NECK: supple, thyroid normal size, non-tender, without nodularity LYMPH:  no palpable lymphadenopathy in the cervical, axillary or inguinal LUNGS: clear to auscultation and percussion with normal breathing effort HEART: regular rate & rhythm and no murmurs and no lower extremity edema ABDOMEN:abdomen soft, non-tender and normal bowel sounds Musculoskeletal:no cyanosis of digits and no clubbing  NEURO: alert & oriented x 3 with fluent speech, no focal motor/sensory deficits   LABORATORY DATA:  I have reviewed the data as listed   Chemistry      Component Value Date/Time   NA 141 10/03/2014 0758   K 3.7 10/03/2014 0758   CO2 22 10/03/2014 0758   BUN 4.4* 10/03/2014 0758   CREATININE 0.7 10/03/2014 0758      Component Value Date/Time   CALCIUM 8.6 10/03/2014 0758   ALKPHOS 67 10/03/2014 0758   AST 12 10/03/2014 0758   ALT 17 10/03/2014 0758   BILITOT 0.20 10/03/2014 0758       Lab Results  Component Value Date   WBC 10.4* 10/17/2014   HGB 10.9* 10/17/2014   HCT 33.3* 10/17/2014   MCV 87.9 10/17/2014   PLT 180 10/17/2014   NEUTROABS 7.8* 10/17/2014   ASSESSMENT & PLAN:  Breast cancer of lower-inner quadrant of right female breast Right breast mass 5 x 4.8 x 4.2 cm, 6 enlarged axillary lymph nodes including nodes in level III location, T2 N3 M0 stage IIIc clinical stage Right breast biopsy 6:00: Invasive ductal carcinoma, 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 Neo-adjuvant chemotherapy with dose dense Adriamycin and Cytoxan 4 followed by weekly Abraxane and carboplatin 12 2. Followed by surgery 3. Followed by radiation therapy  Current Treatment: Today is Cycle 3 day 1 adriamycin and Cytoxan Chemo Toxicities: 1. Constipation: Improved 2. Fatigue 3. Grade  3 neutropenia nadir counts check ANC 0.2, decreasing dose of chemotherapy for cycle 2 Adriamycin and Cytoxan 4. Anemia due to chemotherapy: We'll monitor this hemoglobin 10.3 on 10/03/2014 5. Skin and nail discoloration: Due to chemotherapy, especially on the hands I recommended that she increase her protein intake in the diet. RTC in 2 weeks for cycle 4  No orders of the defined types were placed in this encounter.   The patient has a good understanding of the overall plan. she agrees with it. she will call with any problems that may develop before the next visit here.   Rulon Eisenmenger, MD

## 2014-10-17 NOTE — Addendum Note (Signed)
Addended by: Prentiss Bells on: 10/17/2014 08:58 AM   Modules accepted: Medications

## 2014-10-17 NOTE — Assessment & Plan Note (Signed)
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 Neo-adjuvant chemotherapy with dose dense Adriamycin and Cytoxan 4 followed by weekly Abraxane and carboplatin 12 2. Followed by surgery 3. Followed by radiation therapy  Current Treatment: Today is Cycle 3 day 1 adriamycin and Cytoxan Chemo Toxicities: 1. Constipation 2. Fatigue 3. Grade 3 neutropenia nadir counts check ANC 0.2, decreasing dose of chemotherapy for cycle 2 Adriamycin and Cytoxan 4. Anemia due to chemotherapy: We'll monitor this hemoglobin 10.3 on 10/03/2014  RTC in 2 weeks for cycle 4

## 2014-10-17 NOTE — Patient Instructions (Addendum)
[facility] Discharge Instructions for Patients Receiving Chemotherapy  Today you received the following chemotherapy agents zinecard, adriamycin, cytoxan  To help prevent nausea and vomiting after your treatment, we encourage you to take your nausea medication   If you develop nausea and vomiting that is not controlled by your nausea medication, call the clinic.   BELOW ARE SYMPTOMS THAT SHOULD BE REPORTED IMMEDIATELY:  *FEVER GREATER THAN 100.5 F  *CHILLS WITH OR WITHOUT FEVER  NAUSEA AND VOMITING THAT IS NOT CONTROLLED WITH YOUR NAUSEA MEDICATION  *UNUSUAL SHORTNESS OF BREATH  *UNUSUAL BRUISING OR BLEEDING  TENDERNESS IN MOUTH AND THROAT WITH OR WITHOUT PRESENCE OF ULCERS  *URINARY PROBLEMS  *BOWEL PROBLEMS  UNUSUAL RASH Items with * indicate a potential emergency and should be followed up as soon as possible.  Feel free to call the clinic you have any questions or concerns. The clinic phone number is [phone].  Please show the [place] at check-in to the Emergency Department and triage nurse.

## 2014-10-17 NOTE — Telephone Encounter (Signed)
Gave avs & calendar for August °

## 2014-10-31 ENCOUNTER — Encounter: Payer: Self-pay | Admitting: Hematology and Oncology

## 2014-10-31 ENCOUNTER — Other Ambulatory Visit (HOSPITAL_BASED_OUTPATIENT_CLINIC_OR_DEPARTMENT_OTHER): Payer: 59

## 2014-10-31 ENCOUNTER — Telehealth: Payer: Self-pay | Admitting: Hematology and Oncology

## 2014-10-31 ENCOUNTER — Ambulatory Visit (HOSPITAL_BASED_OUTPATIENT_CLINIC_OR_DEPARTMENT_OTHER): Payer: 59 | Admitting: Hematology and Oncology

## 2014-10-31 ENCOUNTER — Ambulatory Visit (HOSPITAL_BASED_OUTPATIENT_CLINIC_OR_DEPARTMENT_OTHER): Payer: 59

## 2014-10-31 ENCOUNTER — Encounter: Payer: Self-pay | Admitting: *Deleted

## 2014-10-31 VITALS — BP 120/81 | HR 103 | Temp 98.9°F | Resp 18 | Ht 67.0 in | Wt 173.1 lb

## 2014-10-31 DIAGNOSIS — Z170 Estrogen receptor positive status [ER+]: Secondary | ICD-10-CM

## 2014-10-31 DIAGNOSIS — Z5189 Encounter for other specified aftercare: Secondary | ICD-10-CM | POA: Diagnosis not present

## 2014-10-31 DIAGNOSIS — Z5111 Encounter for antineoplastic chemotherapy: Secondary | ICD-10-CM | POA: Diagnosis not present

## 2014-10-31 DIAGNOSIS — D6481 Anemia due to antineoplastic chemotherapy: Secondary | ICD-10-CM

## 2014-10-31 DIAGNOSIS — D701 Agranulocytosis secondary to cancer chemotherapy: Secondary | ICD-10-CM

## 2014-10-31 DIAGNOSIS — C50311 Malignant neoplasm of lower-inner quadrant of right female breast: Secondary | ICD-10-CM

## 2014-10-31 DIAGNOSIS — C773 Secondary and unspecified malignant neoplasm of axilla and upper limb lymph nodes: Secondary | ICD-10-CM

## 2014-10-31 DIAGNOSIS — L608 Other nail disorders: Secondary | ICD-10-CM

## 2014-10-31 DIAGNOSIS — R5383 Other fatigue: Secondary | ICD-10-CM

## 2014-10-31 DIAGNOSIS — K5900 Constipation, unspecified: Secondary | ICD-10-CM

## 2014-10-31 LAB — CBC WITH DIFFERENTIAL/PLATELET
BASO%: 0.6 % (ref 0.0–2.0)
BASOS ABS: 0 10*3/uL (ref 0.0–0.1)
EOS ABS: 0 10*3/uL (ref 0.0–0.5)
EOS%: 0.1 % (ref 0.0–7.0)
HEMATOCRIT: 31.4 % — AB (ref 34.8–46.6)
HEMOGLOBIN: 10.4 g/dL — AB (ref 11.6–15.9)
LYMPH%: 12.3 % — ABNORMAL LOW (ref 14.0–49.7)
MCH: 28.6 pg (ref 25.1–34.0)
MCHC: 33 g/dL (ref 31.5–36.0)
MCV: 86.6 fL (ref 79.5–101.0)
MONO#: 0.6 10*3/uL (ref 0.1–0.9)
MONO%: 7 % (ref 0.0–14.0)
NEUT%: 80 % — ABNORMAL HIGH (ref 38.4–76.8)
NEUTROS ABS: 6.6 10*3/uL — AB (ref 1.5–6.5)
PLATELETS: 309 10*3/uL (ref 145–400)
RBC: 3.63 10*6/uL — ABNORMAL LOW (ref 3.70–5.45)
RDW: 17.1 % — ABNORMAL HIGH (ref 11.2–14.5)
WBC: 8.3 10*3/uL (ref 3.9–10.3)
lymph#: 1 10*3/uL (ref 0.9–3.3)

## 2014-10-31 LAB — COMPREHENSIVE METABOLIC PANEL (CC13)
ALBUMIN: 3.4 g/dL — AB (ref 3.5–5.0)
ALT: 21 U/L (ref 0–55)
ANION GAP: 9 meq/L (ref 3–11)
AST: 15 U/L (ref 5–34)
Alkaline Phosphatase: 80 U/L (ref 40–150)
BILIRUBIN TOTAL: 0.27 mg/dL (ref 0.20–1.20)
BUN: 7.4 mg/dL (ref 7.0–26.0)
CALCIUM: 9.6 mg/dL (ref 8.4–10.4)
CO2: 25 mEq/L (ref 22–29)
CREATININE: 0.8 mg/dL (ref 0.6–1.1)
Chloride: 108 mEq/L (ref 98–109)
EGFR: >90 mL/min/{1.73_m2} (ref >90)
Glucose: 104 mg/dl (ref 70–140)
Potassium: 3.9 mEq/L (ref 3.5–5.1)
Sodium: 143 mEq/L (ref 136–145)
TOTAL PROTEIN: 7 g/dL (ref 6.4–8.3)

## 2014-10-31 MED ORDER — HEPARIN SOD (PORK) LOCK FLUSH 100 UNIT/ML IV SOLN
500.0000 [IU] | Freq: Once | INTRAVENOUS | Status: AC | PRN
  Administered 2014-10-31: 500 [IU]
  Filled 2014-10-31: qty 5

## 2014-10-31 MED ORDER — DOXORUBICIN HCL CHEMO IV INJECTION 2 MG/ML
50.0000 mg/m2 | Freq: Once | INTRAVENOUS | Status: AC
  Administered 2014-10-31: 98 mg via INTRAVENOUS
  Filled 2014-10-31: qty 49

## 2014-10-31 MED ORDER — SODIUM CHLORIDE 0.9 % IV SOLN
500.0000 mg/m2 | Freq: Once | INTRAVENOUS | Status: AC
  Administered 2014-10-31: 980 mg via INTRAVENOUS
  Filled 2014-10-31: qty 49

## 2014-10-31 MED ORDER — PEGFILGRASTIM 6 MG/0.6ML ~~LOC~~ PSKT
6.0000 mg | PREFILLED_SYRINGE | Freq: Once | SUBCUTANEOUS | Status: AC
  Administered 2014-10-31: 6 mg via SUBCUTANEOUS
  Filled 2014-10-31: qty 0.6

## 2014-10-31 MED ORDER — SODIUM CHLORIDE 0.9 % IV SOLN
Freq: Once | INTRAVENOUS | Status: AC
  Administered 2014-10-31: 12:00:00 via INTRAVENOUS

## 2014-10-31 MED ORDER — PALONOSETRON HCL INJECTION 0.25 MG/5ML
0.2500 mg | Freq: Once | INTRAVENOUS | Status: AC
  Administered 2014-10-31: 0.25 mg via INTRAVENOUS

## 2014-10-31 MED ORDER — HYDROCODONE-ACETAMINOPHEN 5-325 MG PO TABS: 1.0000 | ORAL_TABLET | Freq: Four times a day (QID) | ORAL | Status: DC | PRN

## 2014-10-31 MED ORDER — SODIUM CHLORIDE 0.9 % IV SOLN
Freq: Once | INTRAVENOUS | Status: AC
  Administered 2014-10-31: 12:00:00 via INTRAVENOUS
  Filled 2014-10-31: qty 5

## 2014-10-31 MED ORDER — LACTATED RINGERS IV SOLN
497.0000 mg/m2 | Freq: Once | INTRAVENOUS | Status: AC
  Administered 2014-10-31: 980 mg via INTRAVENOUS
  Filled 2014-10-31: qty 98

## 2014-10-31 MED ORDER — PALONOSETRON HCL INJECTION 0.25 MG/5ML
INTRAVENOUS | Status: AC
  Filled 2014-10-31: qty 5

## 2014-10-31 MED ORDER — SODIUM CHLORIDE 0.9 % IJ SOLN
10.0000 mL | INTRAMUSCULAR | Status: DC | PRN
  Administered 2014-10-31: 10 mL
  Filled 2014-10-31: qty 10

## 2014-10-31 NOTE — Assessment & Plan Note (Signed)
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 Neo-adjuvant chemotherapy with dose dense Adriamycin and Cytoxan 4 followed by weekly Abraxane and carboplatin 12 2. Followed by surgery 3. Followed by radiation therapy  Current Treatment: Today is Cycle 4 day 1 adriamycin and Cytoxan Chemo Toxicities: 1. Constipation: Improved 2. Fatigue 3. Grade 3 neutropenia nadir counts check ANC 0.2, decreased dose of chemotherapy for cycle 2 Adriamycin and Cytoxan 4. Anemia due to chemotherapy: monitoring hemoglobin asymptomatic 5. Skin and nail discoloration: Due to chemotherapy, especially on the hands I recommended that she increase her protein intake in the diet. RTC in 2 weeks for cycle 1/12 Abraxane carboplatin

## 2014-10-31 NOTE — Patient Instructions (Signed)
[facility] Discharge Instructions for Patients Receiving Chemotherapy  Today you received the following chemotherapy agents: Adriamycin, Cytoxan and Zinegard  To help prevent nausea and vomiting after your treatment, we encourage you to take your nausea medication:  Compazine 10 mg every 6 hours as needed.   If you develop nausea and vomiting that is not controlled by your nausea medication, call the clinic.   BELOW ARE SYMPTOMS THAT SHOULD BE REPORTED IMMEDIATELY:  *FEVER GREATER THAN 100.5 F  *CHILLS WITH OR WITHOUT FEVER  NAUSEA AND VOMITING THAT IS NOT CONTROLLED WITH YOUR NAUSEA MEDICATION  *UNUSUAL SHORTNESS OF BREATH  *UNUSUAL BRUISING OR BLEEDING  TENDERNESS IN MOUTH AND THROAT WITH OR WITHOUT PRESENCE OF ULCERS  *URINARY PROBLEMS  *BOWEL PROBLEMS  UNUSUAL RASH Items with * indicate a potential emergency and should be followed up as soon as possible.  Feel free to call the clinic you have any questions or concerns. The clinic phone number is [phone].  Please show the [place] at check-in to the Emergency Department and triage nurse.

## 2014-10-31 NOTE — Telephone Encounter (Signed)
Appointments made and avs printed for patient °

## 2014-10-31 NOTE — Progress Notes (Signed)
Patient Care Team: Elisabeth Cara, PA-C as PCP - General (Family Medicine) [name], MD as Consulting Physician (General Surgery) Nicholas Lose, MD as Consulting Physician (Hematology and Oncology) Eppie Gibson, MD as Attending Physician (Radiation Oncology) Rockwell Germany, RN as Registered Nurse Mauro Kaufmann, RN as Registered Nurse  DIAGNOSIS: Breast cancer of lower-inner quadrant of right female breast   Staging form: Breast, AJCC 7th Edition     Clinical stage from 09/07/2014: Stage IIIC (T2, N3, M0) - Unsigned       Staging comments: Staged at breast conference on 6.29.16    SUMMARY OF ONCOLOGIC HISTORY:   Breast cancer of lower-inner quadrant of right female breast   08/29/2014 Mammogram Right breast mass 5 x 4.8 x 4.2 cm, 6 enlarged axillary lymph nodes including nodes in level III location, T2 N3 M0 stage IIIc clinical stage   08/31/2014 Initial Diagnosis Right breast biopsy 6:00: Invasive ductal carcinoma, right axillary lymph node biopsy high-grade carcinoma ER 0% PR 0% HER-2 negative ratio 1.68, Ki-67 80%, grade 3   09/19/2014 -  Neo-Adjuvant Chemotherapy Adriamycin Cytoxan dose dense 4 followed by Abraxane and carboplatin weekly 12    CHIEF COMPLIANT: cycle 4 doses of Zithromax and Cytoxan  INTERVAL HISTORY: Brandy Douglas is a 44 year old with above-mentioned history of right breast cancer continue adjuvant chemotherapy. Today is cycle 4 of dose dense Adriamycin and Cytoxan. Overall she is tolerating it fairly well. Her major complaints of decreased taste as well as skin discoloration. She does not have any nausea or vomiting. She does have moderate fatigue.  REVIEW OF SYSTEMS:   Constitutional: Denies fevers, chills or abnormal weight loss Eyes: Denies blurriness of vision Ears, nose, mouth, throat, and face: Denies mucositis or sore throat Respiratory: Denies cough, dyspnea or wheezes Cardiovascular: Denies palpitation, chest discomfort or lower extremity  swelling Gastrointestinal:  Denies nausea, heartburn or change in bowel habits Skin: Denies abnormal skin rashes Lymphatics: Denies new lymphadenopathy or easy bruising Neurological:Denies numbness, tingling or new weaknesses Behavioral/Psych: Mood is stable, no new changes  Breast:  denies any pain or lumps or nodules in either breasts All other systems were reviewed with the patient and are negative.  I have reviewed the past medical history, past surgical history, social history and family history with the patient and they are unchanged from previous note.  ALLERGIES:  has No Known Allergies.  MEDICATIONS:  Current Outpatient Prescriptions  Medication Sig Dispense Refill  . dexamethasone (DECADRON) 4 MG tablet Take 1 tablet (4 mg total) by mouth 2 (two) times daily. With food- Day after chemo - 2 tabs once a day, then 2 tabs twice a day for 2 days 30 tablet 1  . HYDROcodone-acetaminophen (NORCO/VICODIN) 5-325 MG per tablet Take 1-2 tablets by mouth every 6 (six) hours as needed. 30 tablet 0  . lidocaine-prilocaine (EMLA) cream Apply to affected area once 30 g 3  . LORazepam (ATIVAN) 0.5 MG tablet Take 1 tablet (0.5 mg total) by mouth at bedtime. 30 tablet 0  . naproxen sodium (RA NAPROXEN SODIUM) 220 MG tablet Take 440 mg by mouth.    . ondansetron (ZOFRAN) 8 MG tablet Take 1 tablet (8 mg total) by mouth 2 (two) times daily as needed. Start on the third day after chemotherapy. 30 tablet 1  . prochlorperazine (COMPAZINE) 10 MG tablet Take 1 tablet (10 mg total) by mouth every 6 (six) hours as needed (Nausea or vomiting). 30 tablet 1   No current facility-administered medications for this  visit.    PHYSICAL EXAMINATION: ECOG PERFORMANCE STATUS: 1 - Symptomatic but completely ambulatory  Filed Vitals:   10/31/14 1023  BP: 120/81  Pulse: 103  Temp: 98.9 F (37.2 C)  Resp: 18   Filed Weights   10/31/14 1023  Weight: 173 lb 1.6 oz (78.518 kg)    GENERAL:alert, no distress and  comfortable SKIN: skin color, texture, turgor are normal, no rashes or significant lesions EYES: normal, Conjunctiva are pink and non-injected, sclera clear OROPHARYNX:no exudate, no erythema and lips, buccal mucosa, and tongue normal  NECK: supple, thyroid normal size, non-tender, without nodularity LYMPH:  no palpable lymphadenopathy in the cervical, axillary or inguinal LUNGS: clear to auscultation and percussion with normal breathing effort HEART: regular rate & rhythm and no murmurs and no lower extremity edema ABDOMEN:abdomen soft, non-tender and normal bowel sounds Musculoskeletal:no cyanosis of digits and no clubbing  NEURO: alert & oriented x 3 with fluent speech, no focal motor/sensory deficits  LABORATORY DATA:  I have reviewed the data as listed   Chemistry      Component Value Date/Time   NA 143 10/31/2014 1011   K 3.9 10/31/2014 1011   CO2 25 10/31/2014 1011   BUN 7.4 10/31/2014 1011   CREATININE 0.8 10/31/2014 1011      Component Value Date/Time   CALCIUM 9.6 10/31/2014 1011   ALKPHOS 80 10/31/2014 1011   AST 15 10/31/2014 1011   ALT 21 10/31/2014 1011   BILITOT 0.27 10/31/2014 1011       Lab Results  Component Value Date   WBC 8.3 10/31/2014   HGB 10.4* 10/31/2014   HCT 31.4* 10/31/2014   MCV 86.6 10/31/2014   PLT 309 10/31/2014   NEUTROABS 6.6* 10/31/2014   ASSESSMENT & PLAN:  Breast cancer of lower-inner quadrant of right female breast Right breast mass 5 x 4.8 x 4.2 cm, 6 enlarged axillary lymph nodes including nodes in level III location, T2 N3 M0 stage IIIc clinical stage Right breast biopsy 6:00: Invasive ductal carcinoma, 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 Neo-adjuvant chemotherapy with dose dense Adriamycin and Cytoxan 4 followed by weekly Abraxane and carboplatin 12 2. Followed by surgery 3. Followed by radiation therapy  Current Treatment:  Today is Cycle 4 day 1 adriamycin and Cytoxan Chemo Toxicities: 1. Constipation: Improved 2. Fatigue 3. Grade 3 neutropenia nadir counts check ANC 0.2, decreased dose of chemotherapy for cycle 2 Adriamycin and Cytoxan 4. Anemia due to chemotherapy: monitoring hemoglobin asymptomatic 5. Skin and nail discoloration: Due to chemotherapy, especially on the hands I recommended that she increase her protein intake in the diet. RTC in 2 weeks for cycle 1/12 Abraxane carboplatin   No orders of the defined types were placed in this encounter.   The patient has a good understanding of the overall plan. she agrees with it. she will call with any problems that may develop before the next visit here.   Rulon Eisenmenger, MD

## 2014-11-09 ENCOUNTER — Ambulatory Visit (HOSPITAL_COMMUNITY)
Admission: RE | Admit: 2014-11-09 | Discharge: 2014-11-09 | Disposition: A | Discharge status: Home / Self Care | Payer: 59 | Source: Ambulatory Visit | Attending: Family Medicine | Admitting: Family Medicine

## 2014-11-09 ENCOUNTER — Other Ambulatory Visit (HOSPITAL_COMMUNITY): Payer: Self-pay | Admitting: Internal Medicine

## 2014-11-09 DIAGNOSIS — C50311 Malignant neoplasm of lower-inner quadrant of right female breast: Secondary | ICD-10-CM

## 2014-11-09 NOTE — Progress Notes (Signed)
  Echocardiogram 2D Echocardiogram limited has been performed.  Jennette Dubin 11/09/2014, 9:53 AM

## 2014-11-15 ENCOUNTER — Other Ambulatory Visit (HOSPITAL_BASED_OUTPATIENT_CLINIC_OR_DEPARTMENT_OTHER): Payer: 59

## 2014-11-15 ENCOUNTER — Encounter: Payer: Self-pay | Admitting: Hematology and Oncology

## 2014-11-15 ENCOUNTER — Telehealth: Payer: Self-pay | Admitting: Hematology and Oncology

## 2014-11-15 ENCOUNTER — Ambulatory Visit (HOSPITAL_BASED_OUTPATIENT_CLINIC_OR_DEPARTMENT_OTHER): Payer: 59 | Admitting: Hematology and Oncology

## 2014-11-15 ENCOUNTER — Ambulatory Visit (HOSPITAL_BASED_OUTPATIENT_CLINIC_OR_DEPARTMENT_OTHER): Payer: 59

## 2014-11-15 VITALS — BP 118/77 | HR 100 | Temp 98.2°F | Resp 18 | Ht 67.0 in | Wt 175.3 lb

## 2014-11-15 DIAGNOSIS — C50311 Malignant neoplasm of lower-inner quadrant of right female breast: Secondary | ICD-10-CM

## 2014-11-15 DIAGNOSIS — C773 Secondary and unspecified malignant neoplasm of axilla and upper limb lymph nodes: Secondary | ICD-10-CM | POA: Diagnosis not present

## 2014-11-15 DIAGNOSIS — K5900 Constipation, unspecified: Secondary | ICD-10-CM

## 2014-11-15 DIAGNOSIS — Z171 Estrogen receptor negative status [ER-]: Secondary | ICD-10-CM | POA: Diagnosis not present

## 2014-11-15 DIAGNOSIS — D6481 Anemia due to antineoplastic chemotherapy: Secondary | ICD-10-CM

## 2014-11-15 DIAGNOSIS — L608 Other nail disorders: Secondary | ICD-10-CM

## 2014-11-15 DIAGNOSIS — Z5111 Encounter for antineoplastic chemotherapy: Secondary | ICD-10-CM

## 2014-11-15 DIAGNOSIS — D701 Agranulocytosis secondary to cancer chemotherapy: Secondary | ICD-10-CM

## 2014-11-15 DIAGNOSIS — R5383 Other fatigue: Secondary | ICD-10-CM

## 2014-11-15 LAB — CBC WITH DIFFERENTIAL/PLATELET
BASO%: 0.7 % (ref 0.0–2.0)
Basophils Absolute: 0.1 10*3/uL (ref 0.0–0.1)
EOS%: 0.1 % (ref 0.0–7.0)
Eosinophils Absolute: 0 10*3/uL (ref 0.0–0.5)
HEMATOCRIT: 30.8 % — AB (ref 34.8–46.6)
HEMOGLOBIN: 10.3 g/dL — AB (ref 11.6–15.9)
LYMPH#: 1.1 10*3/uL (ref 0.9–3.3)
LYMPH%: 15.3 % (ref 14.0–49.7)
MCH: 29.4 pg (ref 25.1–34.0)
MCHC: 33.4 g/dL (ref 31.5–36.0)
MCV: 88.1 fL (ref 79.5–101.0)
MONO#: 0.8 10*3/uL (ref 0.1–0.9)
MONO%: 10.9 % (ref 0.0–14.0)
NEUT%: 73 % (ref 38.4–76.8)
NEUTROS ABS: 5.5 10*3/uL (ref 1.5–6.5)
PLATELETS: 292 10*3/uL (ref 145–400)
RBC: 3.49 10*6/uL — AB (ref 3.70–5.45)
RDW: 19.2 % — AB (ref 11.2–14.5)
WBC: 7.5 10*3/uL (ref 3.9–10.3)

## 2014-11-15 LAB — COMPREHENSIVE METABOLIC PANEL (CC13)
ALBUMIN: 3.4 g/dL — AB (ref 3.5–5.0)
ALT: 15 U/L (ref 0–55)
ANION GAP: 6 meq/L (ref 3–11)
AST: 13 U/L (ref 5–34)
Alkaline Phosphatase: 79 U/L (ref 40–150)
BILIRUBIN TOTAL: 0.22 mg/dL (ref 0.20–1.20)
BUN: 6.5 mg/dL — AB (ref 7.0–26.0)
CALCIUM: 9.4 mg/dL (ref 8.4–10.4)
CHLORIDE: 107 meq/L (ref 98–109)
CO2: 28 mEq/L (ref 22–29)
CREATININE: 0.7 mg/dL (ref 0.6–1.1)
EGFR: >90 mL/min/{1.73_m2} (ref >90)
Glucose: 125 mg/dl (ref 70–140)
Potassium: 4 mEq/L (ref 3.5–5.1)
Sodium: 141 mEq/L (ref 136–145)
TOTAL PROTEIN: 7 g/dL (ref 6.4–8.3)

## 2014-11-15 MED ORDER — SODIUM CHLORIDE 0.9 % IV SOLN
Freq: Once | INTRAVENOUS | Status: AC
  Administered 2014-11-15: 15:00:00 via INTRAVENOUS

## 2014-11-15 MED ORDER — PACLITAXEL PROTEIN-BOUND CHEMO INJECTION 100 MG
65.0000 mg/m2 | Freq: Once | INTRAVENOUS | Status: AC
  Administered 2014-11-15: 125 mg via INTRAVENOUS
  Filled 2014-11-15: qty 25

## 2014-11-15 MED ORDER — HEPARIN SOD (PORK) LOCK FLUSH 100 UNIT/ML IV SOLN
500.0000 [IU] | Freq: Once | INTRAVENOUS | Status: AC | PRN
  Administered 2014-11-15: 500 [IU]
  Filled 2014-11-15: qty 5

## 2014-11-15 MED ORDER — PALONOSETRON HCL INJECTION 0.25 MG/5ML
INTRAVENOUS | Status: AC
Start: 2014-11-15 — End: 2014-11-15
  Filled 2014-11-15: qty 5

## 2014-11-15 MED ORDER — SODIUM CHLORIDE 0.9 % IV SOLN
277.8000 mg | Freq: Once | INTRAVENOUS | Status: AC
  Administered 2014-11-15: 280 mg via INTRAVENOUS
  Filled 2014-11-15: qty 28

## 2014-11-15 MED ORDER — PALONOSETRON HCL INJECTION 0.25 MG/5ML
0.2500 mg | Freq: Once | INTRAVENOUS | Status: AC
  Administered 2014-11-15: 0.25 mg via INTRAVENOUS

## 2014-11-15 MED ORDER — SODIUM CHLORIDE 0.9 % IJ SOLN
10.0000 mL | INTRAMUSCULAR | Status: DC | PRN
  Administered 2014-11-15: 10 mL
  Filled 2014-11-15: qty 10

## 2014-11-15 NOTE — Assessment & Plan Note (Signed)
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 Neo-adjuvant chemotherapy with dose dense Adriamycin and Cytoxan 4 followed by weekly Abraxane and carboplatin 12 2. Followed by surgery 3. Followed by radiation therapy  Current Treatment: completed 4 cycles of adriamycin and Cytoxan, today is cycle 1/12 Abraxane and carboplatin Chemo Toxicities: 1. Constipation: Improved 2. alopecia 3. Grade 3 neutropenia nadir counts check ANC 0.2, decreased dose of chemotherapy for cycle 2 Adriamycin and Cytoxan 4. Anemia due to chemotherapy: monitoring hemoglobin asymptomatic 5. Skin and nail discoloration: Due to chemotherapy, especially on the hands 6. Severe fatigue: Unfortunately this is due to chemotherapy and not much can be done to help improve her fatigue. I recommended that she increase her protein intake in the diet.  RTC in 1 week for cycle 2/12 Abraxane carboplatin

## 2014-11-15 NOTE — Patient Instructions (Signed)
[facility] Discharge Instructions for Patients Receiving Chemotherapy  Today you received the following chemotherapy agents Abraxane/Carboplatin.  To help prevent nausea and vomiting after your treatment, we encourage you to take your nausea medication as directed.   If you develop nausea and vomiting that is not controlled by your nausea medication, call the clinic.   BELOW ARE SYMPTOMS THAT SHOULD BE REPORTED IMMEDIATELY:  *FEVER GREATER THAN 100.5 F  *CHILLS WITH OR WITHOUT FEVER  NAUSEA AND VOMITING THAT IS NOT CONTROLLED WITH YOUR NAUSEA MEDICATION  *UNUSUAL SHORTNESS OF BREATH  *UNUSUAL BRUISING OR BLEEDING  TENDERNESS IN MOUTH AND THROAT WITH OR WITHOUT PRESENCE OF ULCERS  *URINARY PROBLEMS  *BOWEL PROBLEMS  UNUSUAL RASH Items with * indicate a potential emergency and should be followed up as soon as possible.  Feel free to call the clinic you have any questions or concerns. The clinic phone number is [phone].  Please show the CHEMO ALERT CARD at check-in to the Emergency Department and triage nurse.    

## 2014-11-15 NOTE — Telephone Encounter (Signed)
Appointments made and avs printed for patient °

## 2014-11-15 NOTE — Progress Notes (Signed)
Patient Care Team: Elisabeth Cara, PA-C as PCP - General (Family Medicine) [name], MD as Consulting Physician (General Surgery) Nicholas Lose, MD as Consulting Physician (Hematology and Oncology) Eppie Gibson, MD as Attending Physician (Radiation Oncology) Rockwell Germany, RN as Registered Nurse Mauro Kaufmann, RN as Registered Nurse  DIAGNOSIS: Breast cancer of lower-inner quadrant of right female breast   Staging form: Breast, AJCC 7th Edition     Clinical stage from 09/07/2014: Stage IIIC (T2, N3, M0) - Unsigned       Staging comments: Staged at breast conference on 6.29.16    SUMMARY OF ONCOLOGIC HISTORY:   Breast cancer of lower-inner quadrant of right female breast   08/29/2014 Mammogram Right breast mass 5 x 4.8 x 4.2 cm, 6 enlarged axillary lymph nodes including nodes in level III location, T2 N3 M0 stage IIIc clinical stage   08/31/2014 Initial Diagnosis Right breast biopsy 6:00: Invasive ductal carcinoma, right axillary lymph node biopsy high-grade carcinoma ER 0% PR 0% HER-2 negative ratio 1.68, Ki-67 80%, grade 3   09/19/2014 -  Neo-Adjuvant Chemotherapy Adriamycin Cytoxan dose dense 4 followed by Abraxane and carboplatin weekly 12    CHIEF COMPLIANT: cycle 1 of Abraxane  INTERVAL HISTORY: Brandy Douglas is a 44 year old lady with above-mentioned history of right breast cancer treated with neo-adjuvant chemotherapy with Adriamycin and Cytoxan. She completed 4 cycles today is her first cycle of Abraxane. She complains of severe fatigue that lasts 3-4 days after chemotherapy and slowly gets better over time. Denies any nausea or vomiting. Denies any diarrhea or constipation.  REVIEW OF SYSTEMS:   Constitutional: Denies fevers, chills or abnormal weight loss Eyes: Denies blurriness of vision Ears, nose, mouth, throat, and face: Denies mucositis or sore throat Respiratory: Denies cough, dyspnea or wheezes Cardiovascular: Denies palpitation, chest discomfort or lower  extremity swelling Gastrointestinal:  Denies nausea, heartburn or change in bowel habits Skin: Denies abnormal skin rashes Lymphatics: Denies new lymphadenopathy or easy bruising Neurological:Denies numbness, tingling or new weaknesses Behavioral/Psych: Mood is stable, no new changes  Breast:  denies any pain or lumps or nodules in either breasts All other systems were reviewed with the patient and are negative.  I have reviewed the past medical history, past surgical history, social history and family history with the patient and they are unchanged from previous note.  ALLERGIES:  has No Known Allergies.  MEDICATIONS:  Current Outpatient Prescriptions  Medication Sig Dispense Refill  . dexamethasone (DECADRON) 4 MG tablet Take 1 tablet (4 mg total) by mouth 2 (two) times daily. With food- Day after chemo - 2 tabs once a day, then 2 tabs twice a day for 2 days 30 tablet 1  . HYDROcodone-acetaminophen (NORCO/VICODIN) 5-325 MG per tablet Take 1-2 tablets by mouth every 6 (six) hours as needed. 30 tablet 0  . lidocaine-prilocaine (EMLA) cream Apply to affected area once 30 g 3  . LORazepam (ATIVAN) 0.5 MG tablet Take 1 tablet (0.5 mg total) by mouth at bedtime. 30 tablet 0  . naproxen sodium (RA NAPROXEN SODIUM) 220 MG tablet Take 440 mg by mouth.    . ondansetron (ZOFRAN) 8 MG tablet Take 1 tablet (8 mg total) by mouth 2 (two) times daily as needed. Start on the third day after chemotherapy. 30 tablet 1  . prochlorperazine (COMPAZINE) 10 MG tablet Take 1 tablet (10 mg total) by mouth every 6 (six) hours as needed (Nausea or vomiting). 30 tablet 1   No current facility-administered medications for this  visit.    PHYSICAL EXAMINATION: ECOG PERFORMANCE STATUS: 1 - Symptomatic but completely ambulatory  Filed Vitals:   11/15/14 1317  BP: 118/77  Pulse: 100  Temp: 98.2 F (36.8 C)  Resp: 18   Filed Weights   11/15/14 1317  Weight: 175 lb 4.8 oz (79.516 kg)    GENERAL:alert, no  distress and comfortable SKIN: skin color, texture, turgor are normal, no rashes or significant lesions EYES: normal, Conjunctiva are pink and non-injected, sclera clear OROPHARYNX:no exudate, no erythema and lips, buccal mucosa, and tongue normal  NECK: supple, thyroid normal size, non-tender, without nodularity LYMPH:  no palpable lymphadenopathy in the cervical, axillary or inguinal LUNGS: clear to auscultation and percussion with normal breathing effort HEART: regular rate & rhythm and no murmurs and no lower extremity edema ABDOMEN:abdomen soft, non-tender and normal bowel sounds Musculoskeletal:no cyanosis of digits and no clubbing  NEURO: alert & oriented x 3 with fluent speech, no focal motor/sensory deficits LABORATORY DATA:  I have reviewed the data as listed   Chemistry      Component Value Date/Time   NA 141 11/15/2014 1309   K 4.0 11/15/2014 1309   CO2 28 11/15/2014 1309   BUN 6.5* 11/15/2014 1309   CREATININE 0.7 11/15/2014 1309      Component Value Date/Time   CALCIUM 9.4 11/15/2014 1309   ALKPHOS 79 11/15/2014 1309   AST 13 11/15/2014 1309   ALT 15 11/15/2014 1309   BILITOT 0.22 11/15/2014 1309       Lab Results  Component Value Date   WBC 7.5 11/15/2014   HGB 10.3* 11/15/2014   HCT 30.8* 11/15/2014   MCV 88.1 11/15/2014   PLT 292 11/15/2014   NEUTROABS 5.5 11/15/2014   ASSESSMENT & PLAN:  Breast cancer of lower-inner quadrant of right female breast Right breast mass 5 x 4.8 x 4.2 cm, 6 enlarged axillary lymph nodes including nodes in level III location, T2 N3 M0 stage IIIc clinical stage Right breast biopsy 6:00: Invasive ductal carcinoma, 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 Neo-adjuvant chemotherapy with dose dense Adriamycin and Cytoxan 4 followed by weekly Abraxane and carboplatin 12 2. Followed by surgery 3. Followed by radiation therapy  Current  Treatment: completed 4 cycles of adriamycin and Cytoxan, today is cycle 1/12 Abraxane and carboplatin Chemo Toxicities: 1. Constipation: Improved 2. alopecia 3. Grade 3 neutropenia nadir counts check ANC 0.2, decreased dose of chemotherapy for cycle 2 Adriamycin and Cytoxan 4. Anemia due to chemotherapy: monitoring hemoglobin asymptomatic 5. Skin and nail discoloration: Due to chemotherapy, especially on the hands 6. Severe fatigue: Unfortunately this is due to chemotherapy and not much can be done to help improve her fatigue. I recommended that she increase her protein intake in the diet.  RTC in 1 week for cycle 2/12 Abraxane carboplatin    No orders of the defined types were placed in this encounter.   The patient has a good understanding of the overall plan. she agrees with it. she will call with any problems that may develop before the next visit here.   Rulon Eisenmenger, MD

## 2014-11-17 ENCOUNTER — Encounter: Payer: Self-pay | Admitting: *Deleted

## 2014-11-18 ENCOUNTER — Other Ambulatory Visit: Payer: Self-pay

## 2014-11-18 DIAGNOSIS — C50311 Malignant neoplasm of lower-inner quadrant of right female breast: Secondary | ICD-10-CM

## 2014-11-21 ENCOUNTER — Ambulatory Visit (HOSPITAL_BASED_OUTPATIENT_CLINIC_OR_DEPARTMENT_OTHER): Payer: 59

## 2014-11-21 ENCOUNTER — Other Ambulatory Visit (HOSPITAL_BASED_OUTPATIENT_CLINIC_OR_DEPARTMENT_OTHER): Payer: 59

## 2014-11-21 ENCOUNTER — Encounter: Payer: Self-pay | Admitting: Hematology and Oncology

## 2014-11-21 ENCOUNTER — Telehealth: Payer: Self-pay

## 2014-11-21 ENCOUNTER — Ambulatory Visit (HOSPITAL_BASED_OUTPATIENT_CLINIC_OR_DEPARTMENT_OTHER): Payer: 59 | Admitting: Hematology and Oncology

## 2014-11-21 ENCOUNTER — Telehealth: Payer: Self-pay | Admitting: Hematology and Oncology

## 2014-11-21 VITALS — BP 109/69 | HR 112 | Temp 98.3°F | Resp 18 | Ht 67.0 in | Wt 168.9 lb

## 2014-11-21 DIAGNOSIS — K5900 Constipation, unspecified: Secondary | ICD-10-CM

## 2014-11-21 DIAGNOSIS — D6481 Anemia due to antineoplastic chemotherapy: Secondary | ICD-10-CM

## 2014-11-21 DIAGNOSIS — Z171 Estrogen receptor negative status [ER-]: Secondary | ICD-10-CM

## 2014-11-21 DIAGNOSIS — C50311 Malignant neoplasm of lower-inner quadrant of right female breast: Secondary | ICD-10-CM

## 2014-11-21 DIAGNOSIS — C773 Secondary and unspecified malignant neoplasm of axilla and upper limb lymph nodes: Secondary | ICD-10-CM | POA: Diagnosis not present

## 2014-11-21 DIAGNOSIS — R530 Neoplastic (malignant) related fatigue: Secondary | ICD-10-CM

## 2014-11-21 DIAGNOSIS — Z5111 Encounter for antineoplastic chemotherapy: Secondary | ICD-10-CM | POA: Diagnosis not present

## 2014-11-21 DIAGNOSIS — L608 Other nail disorders: Secondary | ICD-10-CM

## 2014-11-21 DIAGNOSIS — D701 Agranulocytosis secondary to cancer chemotherapy: Secondary | ICD-10-CM

## 2014-11-21 LAB — CBC WITH DIFFERENTIAL/PLATELET
BASO%: 0.3 % (ref 0.0–2.0)
BASOS ABS: 0 10*3/uL (ref 0.0–0.1)
EOS%: 0.4 % (ref 0.0–7.0)
Eosinophils Absolute: 0 10*3/uL (ref 0.0–0.5)
HEMATOCRIT: 32.2 % — AB (ref 34.8–46.6)
HGB: 10.7 g/dL — ABNORMAL LOW (ref 11.6–15.9)
LYMPH#: 1.2 10*3/uL (ref 0.9–3.3)
LYMPH%: 24.3 % (ref 14.0–49.7)
MCH: 29.1 pg (ref 25.1–34.0)
MCHC: 33.1 g/dL (ref 31.5–36.0)
MCV: 88 fL (ref 79.5–101.0)
MONO#: 0.7 10*3/uL (ref 0.1–0.9)
MONO%: 13.2 % (ref 0.0–14.0)
NEUT#: 3.1 10*3/uL (ref 1.5–6.5)
NEUT%: 61.8 % (ref 38.4–76.8)
PLATELETS: 413 10*3/uL — AB (ref 145–400)
RBC: 3.66 10*6/uL — AB (ref 3.70–5.45)
RDW: 19.1 % — ABNORMAL HIGH (ref 11.2–14.5)
WBC: 5 10*3/uL (ref 3.9–10.3)

## 2014-11-21 LAB — COMPREHENSIVE METABOLIC PANEL (CC13)
ALT: 14 U/L (ref 0–55)
ANION GAP: 11 meq/L (ref 3–11)
AST: 11 U/L (ref 5–34)
Albumin: 3.6 g/dL (ref 3.5–5.0)
Alkaline Phosphatase: 63 U/L (ref 40–150)
BUN: 11.9 mg/dL (ref 7.0–26.0)
CALCIUM: 9.4 mg/dL (ref 8.4–10.4)
CHLORIDE: 103 meq/L (ref 98–109)
CO2: 26 meq/L (ref 22–29)
CREATININE: 0.8 mg/dL (ref 0.6–1.1)
EGFR: >90 mL/min/{1.73_m2} (ref >90)
Glucose: 127 mg/dl (ref 70–140)
POTASSIUM: 3.4 meq/L — AB (ref 3.5–5.1)
Sodium: 141 mEq/L (ref 136–145)
Total Bilirubin: 0.3 mg/dL (ref 0.20–1.20)
Total Protein: 7.3 g/dL (ref 6.4–8.3)

## 2014-11-21 MED ORDER — SODIUM CHLORIDE 0.9 % IV SOLN
Freq: Once | INTRAVENOUS | Status: AC
  Administered 2014-11-21: 11:00:00 via INTRAVENOUS

## 2014-11-21 MED ORDER — CARBOPLATIN CHEMO INJECTION 450 MG/45ML
277.8000 mg | Freq: Once | INTRAVENOUS | Status: AC
  Administered 2014-11-21: 280 mg via INTRAVENOUS
  Filled 2014-11-21: qty 28

## 2014-11-21 MED ORDER — PACLITAXEL PROTEIN-BOUND CHEMO INJECTION 100 MG
65.0000 mg/m2 | Freq: Once | INTRAVENOUS | Status: AC
  Administered 2014-11-21: 125 mg via INTRAVENOUS
  Filled 2014-11-21: qty 25

## 2014-11-21 MED ORDER — SODIUM CHLORIDE 0.9 % IJ SOLN
10.0000 mL | INTRAMUSCULAR | Status: DC | PRN
  Administered 2014-11-21: 10 mL
  Filled 2014-11-21: qty 10

## 2014-11-21 MED ORDER — PALONOSETRON HCL INJECTION 0.25 MG/5ML
0.2500 mg | Freq: Once | INTRAVENOUS | Status: AC
  Administered 2014-11-21: 0.25 mg via INTRAVENOUS

## 2014-11-21 MED ORDER — HEPARIN SOD (PORK) LOCK FLUSH 100 UNIT/ML IV SOLN
500.0000 [IU] | Freq: Once | INTRAVENOUS | Status: AC | PRN
  Administered 2014-11-21: 500 [IU]
  Filled 2014-11-21: qty 5

## 2014-11-21 MED ORDER — PALONOSETRON HCL INJECTION 0.25 MG/5ML
INTRAVENOUS | Status: AC
  Filled 2014-11-21: qty 5

## 2014-11-21 NOTE — Progress Notes (Signed)
Patient Care Team: Elisabeth Cara, PA-C as PCP - General (Family Medicine) [name], MD as Consulting Physician (General Surgery) Nicholas Lose, MD as Consulting Physician (Hematology and Oncology) Eppie Gibson, MD as Attending Physician (Radiation Oncology) Rockwell Germany, RN as Registered Nurse Mauro Kaufmann, RN as Registered Nurse  DIAGNOSIS: Breast cancer of lower-inner quadrant of right female breast   Staging form: Breast, AJCC 7th Edition     Clinical stage from 09/07/2014: Stage IIIC (T2, N3, M0) - Unsigned       Staging comments: Staged at breast conference on 6.29.16    SUMMARY OF ONCOLOGIC HISTORY:   Breast cancer of lower-inner quadrant of right female breast   08/29/2014 Mammogram Right breast mass 5 x 4.8 x 4.2 cm, 6 enlarged axillary lymph nodes including nodes in level III location, T2 N3 M0 stage IIIc clinical stage   08/31/2014 Initial Diagnosis Right breast biopsy 6:00: Invasive ductal carcinoma, right axillary lymph node biopsy high-grade carcinoma ER 0% PR 0% HER-2 negative ratio 1.68, Ki-67 80%, grade 3   09/19/2014 -  Neo-Adjuvant Chemotherapy Adriamycin Cytoxan dose dense 4 followed by Abraxane and carboplatin weekly 12    CHIEF COMPLIANT: secondary to Abraxane  INTERVAL HISTORY: Brandy Douglas is a 44 year old above-mentioned history of right breast cancer on neo-adjuvant chemotherapy. She is here to receive week 2 of Abraxane. She reports to be doing better than prior chemotherapy but continues to have fatigue. She denies any nausea vomiting. Denies any myalgias. Denies any fevers or chills.  REVIEW OF SYSTEMS:   Constitutional: Denies fevers, chills or abnormal weight loss, fatigue from chemotherapy Eyes: Denies blurriness of vision Ears, nose, mouth, throat, and face: Denies mucositis or sore throat Respiratory: Denies cough, dyspnea or wheezes Cardiovascular: Denies palpitation, chest discomfort or lower extremity swelling Gastrointestinal:   constipation Skin: Denies abnormal skin rashes Lymphatics: Denies new lymphadenopathy or easy bruising Neurological:Denies numbness, tingling or new weaknesses Behavioral/Psych: Mood is stable, no new changes   All other systems were reviewed with the patient and are negative.  I have reviewed the past medical history, past surgical history, social history and family history with the patient and they are unchanged from previous note.  ALLERGIES:  has No Known Allergies.  MEDICATIONS:  Current Outpatient Prescriptions  Medication Sig Dispense Refill  . dexamethasone (DECADRON) 4 MG tablet Take 1 tablet (4 mg total) by mouth 2 (two) times daily. With food- Day after chemo - 2 tabs once a day, then 2 tabs twice a day for 2 days 30 tablet 1  . HYDROcodone-acetaminophen (NORCO/VICODIN) 5-325 MG per tablet Take 1-2 tablets by mouth every 6 (six) hours as needed. 30 tablet 0  . lidocaine-prilocaine (EMLA) cream Apply to affected area once 30 g 3  . LORazepam (ATIVAN) 0.5 MG tablet Take 1 tablet (0.5 mg total) by mouth at bedtime. 30 tablet 0  . naproxen sodium (RA NAPROXEN SODIUM) 220 MG tablet Take 440 mg by mouth.    . ondansetron (ZOFRAN) 8 MG tablet Take 1 tablet (8 mg total) by mouth 2 (two) times daily as needed. Start on the third day after chemotherapy. 30 tablet 1  . prochlorperazine (COMPAZINE) 10 MG tablet Take 1 tablet (10 mg total) by mouth every 6 (six) hours as needed (Nausea or vomiting). 30 tablet 1   No current facility-administered medications for this visit.    PHYSICAL EXAMINATION: ECOG PERFORMANCE STATUS: 1 - Symptomatic but completely ambulatory  Filed Vitals:   11/21/14 1007  BP: 109/69  Pulse: 112  Temp: 98.3 F (36.8 C)  Resp: 18   Filed Weights   11/21/14 1007  Weight: 168 lb 14.4 oz (76.613 kg)    GENERAL:alert, no distress and comfortable SKIN: skin color, texture, turgor are normal, no rashes or significant lesions EYES: normal, Conjunctiva are pink  and non-injected, sclera clear OROPHARYNX:no exudate, no erythema and lips, buccal mucosa, and tongue normal  NECK: supple, thyroid normal size, non-tender, without nodularity LYMPH:  no palpable lymphadenopathy in the cervical, axillary or inguinal LUNGS: clear to auscultation and percussion with normal breathing effort HEART: regular rate & rhythm and no murmurs and no lower extremity edema ABDOMEN:abdomen soft, non-tender and normal bowel sounds Musculoskeletal:no cyanosis of digits and no clubbing  NEURO: alert & oriented x 3 with fluent speech, no focal motor/sensory deficits  LABORATORY DATA:  I have reviewed the data as listed   Chemistry      Component Value Date/Time   NA 141 11/15/2014 1309   K 4.0 11/15/2014 1309   CO2 28 11/15/2014 1309   BUN 6.5* 11/15/2014 1309   CREATININE 0.7 11/15/2014 1309      Component Value Date/Time   CALCIUM 9.4 11/15/2014 1309   ALKPHOS 79 11/15/2014 1309   AST 13 11/15/2014 1309   ALT 15 11/15/2014 1309   BILITOT 0.22 11/15/2014 1309       Lab Results  Component Value Date   WBC 5.0 11/21/2014   HGB 10.7* 11/21/2014   HCT 32.2* 11/21/2014   MCV 88.0 11/21/2014   PLT 413* 11/21/2014   NEUTROABS 3.1 11/21/2014    ASSESSMENT & PLAN:  Breast cancer of lower-inner quadrant of right female breast Right breast mass 5 x 4.8 x 4.2 cm, 6 enlarged axillary lymph nodes including nodes in level III location, T2 N3 M0 stage IIIc clinical stage Right breast biopsy 6:00: Invasive ductal carcinoma, 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 Neo-adjuvant chemotherapy with dose dense Adriamycin and Cytoxan 4 followed by weekly Abraxane and carboplatin 12 2. Followed by surgery 3. Followed by radiation therapy  Current Treatment: completed 4 cycles of adriamycin and Cytoxan, today is cycle 2/12 Abraxane and carboplatin Chemo Toxicities: 1.  Constipation: Improved with Miralax 2. alopecia 3. Grade 3 neutropenia nadir counts check ANC 0.2, decreased dose of chemotherapy for cycle 2 Adriamycin and Cytoxan 4. Anemia due to chemotherapy: monitoring hemoglobin asymptomatic 5. Skin and nail discoloration: Due to chemotherapy, especially on the hands 6. Severe fatigue: Unfortunately this is due to chemotherapy and not much can be done to help improve her fatigue. I recommended that she increase her protein intake in the diet. 7. Anemia due to chemotherapy:improving on Abraxane  Monitoring closely for toxicities RTC in 2 week for cycle 4/12 Abraxane carboplatin   No orders of the defined types were placed in this encounter.   The patient has a good understanding of the overall plan. she agrees with it. she will call with any problems that may develop before the next visit here.   Rulon Eisenmenger, MD

## 2014-11-21 NOTE — Telephone Encounter (Signed)
Appointments made and avs printed for patient °

## 2014-11-21 NOTE — Telephone Encounter (Signed)
-----   Message from Ronnette Juniper, RN sent at 11/15/2014  4:17 PM EDT ----- Regarding: 1st treatment: Gudena 1st treatment: Abraxane/Carboplatin. Received A/C in past.   Gudena  [phone] (H)

## 2014-11-21 NOTE — Addendum Note (Signed)
Addended by: Prentiss Bells on: 11/21/2014 10:29 AM   Modules accepted: Medications

## 2014-11-21 NOTE — Telephone Encounter (Signed)
Pt scheduled following up with Dr Lindi Adie today

## 2014-11-21 NOTE — Patient Instructions (Signed)
[facility] Discharge Instructions for Patients Receiving Chemotherapy  Today you received the following chemotherapy agents Abraxane/Carboplatin.  To help prevent nausea and vomiting after your treatment, we encourage you to take your nausea medication as directed.   If you develop nausea and vomiting that is not controlled by your nausea medication, call the clinic.   BELOW ARE SYMPTOMS THAT SHOULD BE REPORTED IMMEDIATELY:  *FEVER GREATER THAN 100.5 F  *CHILLS WITH OR WITHOUT FEVER  NAUSEA AND VOMITING THAT IS NOT CONTROLLED WITH YOUR NAUSEA MEDICATION  *UNUSUAL SHORTNESS OF BREATH  *UNUSUAL BRUISING OR BLEEDING  TENDERNESS IN MOUTH AND THROAT WITH OR WITHOUT PRESENCE OF ULCERS  *URINARY PROBLEMS  *BOWEL PROBLEMS  UNUSUAL RASH Items with * indicate a potential emergency and should be followed up as soon as possible.  Feel free to call the clinic you have any questions or concerns. The clinic phone number is [phone].  Please show the CHEMO ALERT CARD at check-in to the Emergency Department and triage nurse.    

## 2014-11-21 NOTE — Assessment & Plan Note (Signed)
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 Neo-adjuvant chemotherapy with dose dense Adriamycin and Cytoxan 4 followed by weekly Abraxane and carboplatin 12 2. Followed by surgery 3. Followed by radiation therapy  Current Treatment: completed 4 cycles of adriamycin and Cytoxan, today is cycle 2/12 Abraxane and carboplatin Chemo Toxicities: 1. Constipation: Improved 2. alopecia 3. Grade 3 neutropenia nadir counts check ANC 0.2, decreased dose of chemotherapy for cycle 2 Adriamycin and Cytoxan 4. Anemia due to chemotherapy: monitoring hemoglobin asymptomatic 5. Skin and nail discoloration: Due to chemotherapy, especially on the hands 6. Severe fatigue: Unfortunately this is due to chemotherapy and not much can be done to help improve her fatigue. I recommended that she increase her protein intake in the diet.  RTC in 2 week for cycle 4/12 Abraxane carboplatin

## 2014-11-28 ENCOUNTER — Other Ambulatory Visit (HOSPITAL_BASED_OUTPATIENT_CLINIC_OR_DEPARTMENT_OTHER): Payer: 59

## 2014-11-28 ENCOUNTER — Ambulatory Visit (HOSPITAL_BASED_OUTPATIENT_CLINIC_OR_DEPARTMENT_OTHER): Payer: 59

## 2014-11-28 VITALS — BP 119/79 | HR 98 | Temp 97.9°F | Resp 18

## 2014-11-28 DIAGNOSIS — C50311 Malignant neoplasm of lower-inner quadrant of right female breast: Secondary | ICD-10-CM

## 2014-11-28 DIAGNOSIS — Z5111 Encounter for antineoplastic chemotherapy: Secondary | ICD-10-CM

## 2014-11-28 LAB — COMPREHENSIVE METABOLIC PANEL (CC13)
ALBUMIN: 3.4 g/dL — AB (ref 3.5–5.0)
ALK PHOS: 52 U/L (ref 40–150)
ALT: 15 U/L (ref 0–55)
ANION GAP: 7 meq/L (ref 3–11)
AST: 14 U/L (ref 5–34)
BILIRUBIN TOTAL: 0.28 mg/dL (ref 0.20–1.20)
BUN: 8.8 mg/dL (ref 7.0–26.0)
CALCIUM: 9.3 mg/dL (ref 8.4–10.4)
CO2: 27 meq/L (ref 22–29)
CREATININE: 0.7 mg/dL (ref 0.6–1.1)
Chloride: 108 mEq/L (ref 98–109)
EGFR: >90 mL/min/{1.73_m2} (ref >90)
Glucose: 105 mg/dl (ref 70–140)
Potassium: 3.8 mEq/L (ref 3.5–5.1)
Sodium: 143 mEq/L (ref 136–145)
TOTAL PROTEIN: 6.8 g/dL (ref 6.4–8.3)

## 2014-11-28 LAB — CBC WITH DIFFERENTIAL/PLATELET
BASO%: 0.4 % (ref 0.0–2.0)
Basophils Absolute: 0 10*3/uL (ref 0.0–0.1)
EOS ABS: 0 10*3/uL (ref 0.0–0.5)
EOS%: 0.2 % (ref 0.0–7.0)
HEMATOCRIT: 28.3 % — AB (ref 34.8–46.6)
HEMOGLOBIN: 9.5 g/dL — AB (ref 11.6–15.9)
LYMPH#: 1.4 10*3/uL (ref 0.9–3.3)
LYMPH%: 24 % (ref 14.0–49.7)
MCH: 29.9 pg (ref 25.1–34.0)
MCHC: 33.7 g/dL (ref 31.5–36.0)
MCV: 88.8 fL (ref 79.5–101.0)
MONO#: 0.6 10*3/uL (ref 0.1–0.9)
MONO%: 11.3 % (ref 0.0–14.0)
NEUT%: 64.1 % (ref 38.4–76.8)
NEUTROS ABS: 3.6 10*3/uL (ref 1.5–6.5)
PLATELETS: 284 10*3/uL (ref 145–400)
RBC: 3.19 10*6/uL — ABNORMAL LOW (ref 3.70–5.45)
RDW: 19.4 % — ABNORMAL HIGH (ref 11.2–14.5)
WBC: 5.7 10*3/uL (ref 3.9–10.3)

## 2014-11-28 MED ORDER — SODIUM CHLORIDE 0.9 % IV SOLN
280.0000 mg | Freq: Once | INTRAVENOUS | Status: AC
  Administered 2014-11-28: 280 mg via INTRAVENOUS
  Filled 2014-11-28: qty 28

## 2014-11-28 MED ORDER — SODIUM CHLORIDE 0.9 % IJ SOLN
10.0000 mL | INTRAMUSCULAR | Status: DC | PRN
  Administered 2014-11-28: 10 mL
  Filled 2014-11-28: qty 10

## 2014-11-28 MED ORDER — HEPARIN SOD (PORK) LOCK FLUSH 100 UNIT/ML IV SOLN
500.0000 [IU] | Freq: Once | INTRAVENOUS | Status: AC | PRN
  Administered 2014-11-28: 500 [IU]
  Filled 2014-11-28: qty 5

## 2014-11-28 MED ORDER — PALONOSETRON HCL INJECTION 0.25 MG/5ML
0.2500 mg | Freq: Once | INTRAVENOUS | Status: AC
  Administered 2014-11-28: 0.25 mg via INTRAVENOUS

## 2014-11-28 MED ORDER — SODIUM CHLORIDE 0.9 % IV SOLN
Freq: Once | INTRAVENOUS | Status: AC
  Administered 2014-11-28: 11:00:00 via INTRAVENOUS

## 2014-11-28 MED ORDER — PACLITAXEL PROTEIN-BOUND CHEMO INJECTION 100 MG
65.0000 mg/m2 | Freq: Once | INTRAVENOUS | Status: AC
  Administered 2014-11-28: 125 mg via INTRAVENOUS
  Filled 2014-11-28: qty 25

## 2014-11-28 MED ORDER — PALONOSETRON HCL INJECTION 0.25 MG/5ML
INTRAVENOUS | Status: AC
  Filled 2014-11-28: qty 5

## 2014-11-28 NOTE — Patient Instructions (Signed)
[facility] Discharge Instructions for Patients Receiving Chemotherapy  Today you received the following chemotherapy agents;  Abraxene and Carboplatin.    To help prevent nausea and vomiting after your treatment, we encourage you to take your nausea medication; Prochlorperazine  (Compazine) and Zofran (Ondansetron) as directed.    If you develop nausea and vomiting that is not controlled by your nausea medication, call the clinic.   BELOW ARE SYMPTOMS THAT SHOULD BE REPORTED IMMEDIATELY:  *FEVER GREATER THAN 100.5 F  *CHILLS WITH OR WITHOUT FEVER  NAUSEA AND VOMITING THAT IS NOT CONTROLLED WITH YOUR NAUSEA MEDICATION  *UNUSUAL SHORTNESS OF BREATH  *UNUSUAL BRUISING OR BLEEDING  TENDERNESS IN MOUTH AND THROAT WITH OR WITHOUT PRESENCE OF ULCERS  *URINARY PROBLEMS  *BOWEL PROBLEMS  UNUSUAL RASH Items with * indicate a potential emergency and should be followed up as soon as possible.  Feel free to call the clinic you have any questions or concerns. The clinic phone number is [phone].  Please show the [facility] at check-in to the Emergency Department and triage nurse.

## 2014-12-04 NOTE — Assessment & Plan Note (Signed)
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 Neo-adjuvant chemotherapy with dose dense Adriamycin and Cytoxan 4 followed by weekly Abraxane and carboplatin 12 2. Followed by surgery 3. Followed by radiation therapy  Current Treatment: completed 4 cycles of adriamycin and Cytoxan, today is cycle 4/12 Abraxane and carboplatin Chemo Toxicities: 1. Constipation: Improved with Miralax 2. alopecia 3. Grade 3 neutropenia nadir counts check ANC 0.2, decreased dose of chemotherapy for cycle 2 Adriamycin and Cytoxan 4. Anemia due to chemotherapy: monitoring hemoglobin asymptomatic 5. Skin and nail discoloration: Due to chemotherapy, especially on the hands 6. Severe fatigue: Unfortunately this is due to chemotherapy and not much can be done to help improve her fatigue. I recommended that she increase her protein intake in the diet. 7. Anemia due to chemotherapy:improving on Abraxane  Monitoring closely for toxicities RTC in 2 week for cycle 6/12 Abraxane carboplatin

## 2014-12-05 ENCOUNTER — Encounter: Payer: Self-pay | Admitting: Hematology and Oncology

## 2014-12-05 ENCOUNTER — Ambulatory Visit (HOSPITAL_BASED_OUTPATIENT_CLINIC_OR_DEPARTMENT_OTHER): Payer: 59 | Admitting: Hematology and Oncology

## 2014-12-05 ENCOUNTER — Other Ambulatory Visit: Payer: Self-pay | Admitting: *Deleted

## 2014-12-05 ENCOUNTER — Ambulatory Visit (HOSPITAL_BASED_OUTPATIENT_CLINIC_OR_DEPARTMENT_OTHER): Payer: 59

## 2014-12-05 ENCOUNTER — Ambulatory Visit: Payer: 59

## 2014-12-05 ENCOUNTER — Other Ambulatory Visit: Payer: 59

## 2014-12-05 ENCOUNTER — Other Ambulatory Visit (HOSPITAL_BASED_OUTPATIENT_CLINIC_OR_DEPARTMENT_OTHER): Payer: 59

## 2014-12-05 VITALS — Temp 98.3°F

## 2014-12-05 VITALS — BP 109/69 | HR 94 | Temp 94.0°F | Resp 18 | Ht 67.0 in | Wt 175.3 lb

## 2014-12-05 DIAGNOSIS — C50311 Malignant neoplasm of lower-inner quadrant of right female breast: Secondary | ICD-10-CM

## 2014-12-05 DIAGNOSIS — Z5111 Encounter for antineoplastic chemotherapy: Secondary | ICD-10-CM | POA: Diagnosis not present

## 2014-12-05 DIAGNOSIS — Z452 Encounter for adjustment and management of vascular access device: Secondary | ICD-10-CM

## 2014-12-05 LAB — COMPREHENSIVE METABOLIC PANEL (CC13)
ALBUMIN: 3.4 g/dL — AB (ref 3.5–5.0)
ALK PHOS: 68 U/L (ref 40–150)
ALT: 14 U/L (ref 0–55)
ANION GAP: 7 meq/L (ref 3–11)
AST: 15 U/L (ref 5–34)
BUN: 5 mg/dL — ABNORMAL LOW (ref 7.0–26.0)
CO2: 27 mEq/L (ref 22–29)
Calcium: 9.2 mg/dL (ref 8.4–10.4)
Chloride: 107 mEq/L (ref 98–109)
Creatinine: 0.7 mg/dL (ref 0.6–1.1)
GLUCOSE: 103 mg/dL (ref 70–140)
Potassium: 3.7 mEq/L (ref 3.5–5.1)
Sodium: 142 mEq/L (ref 136–145)
TOTAL PROTEIN: 6.7 g/dL (ref 6.4–8.3)

## 2014-12-05 LAB — CBC WITH DIFFERENTIAL/PLATELET
BASO%: 0.6 % (ref 0.0–2.0)
Basophils Absolute: 0 10*3/uL (ref 0.0–0.1)
EOS ABS: 0 10*3/uL (ref 0.0–0.5)
EOS%: 0.3 % (ref 0.0–7.0)
HCT: 28.6 % — ABNORMAL LOW (ref 34.8–46.6)
HEMOGLOBIN: 9.8 g/dL — AB (ref 11.6–15.9)
LYMPH%: 30.7 % (ref 14.0–49.7)
MCH: 30.7 pg (ref 25.1–34.0)
MCHC: 34.2 g/dL (ref 31.5–36.0)
MCV: 89.5 fL (ref 79.5–101.0)
MONO#: 0.4 10*3/uL (ref 0.1–0.9)
MONO%: 10.4 % (ref 0.0–14.0)
NEUT%: 58 % (ref 38.4–76.8)
NEUTROS ABS: 2.2 10*3/uL (ref 1.5–6.5)
PLATELETS: 230 10*3/uL (ref 145–400)
RBC: 3.2 10*6/uL — ABNORMAL LOW (ref 3.70–5.45)
RDW: 20 % — AB (ref 11.2–14.5)
WBC: 3.9 10*3/uL (ref 3.9–10.3)
lymph#: 1.2 10*3/uL (ref 0.9–3.3)

## 2014-12-05 MED ORDER — SODIUM CHLORIDE 0.9 % IV SOLN
277.8000 mg | Freq: Once | INTRAVENOUS | Status: AC
  Administered 2014-12-05: 280 mg via INTRAVENOUS
  Filled 2014-12-05: qty 28

## 2014-12-05 MED ORDER — PALONOSETRON HCL INJECTION 0.25 MG/5ML
INTRAVENOUS | Status: AC
  Filled 2014-12-05: qty 5

## 2014-12-05 MED ORDER — PACLITAXEL PROTEIN-BOUND CHEMO INJECTION 100 MG
65.0000 mg/m2 | Freq: Once | INTRAVENOUS | Status: AC
  Administered 2014-12-05: 125 mg via INTRAVENOUS
  Filled 2014-12-05: qty 25

## 2014-12-05 MED ORDER — ALTEPLASE 2 MG IJ SOLR
2.0000 mg | Freq: Once | INTRAMUSCULAR | Status: AC | PRN
  Administered 2014-12-05: 2 mg
  Filled 2014-12-05: qty 2

## 2014-12-05 MED ORDER — HEPARIN SOD (PORK) LOCK FLUSH 100 UNIT/ML IV SOLN
500.0000 [IU] | Freq: Once | INTRAVENOUS | Status: AC | PRN
  Administered 2014-12-05: 500 [IU]
  Filled 2014-12-05: qty 5

## 2014-12-05 MED ORDER — SODIUM CHLORIDE 0.9 % IJ SOLN
10.0000 mL | INTRAMUSCULAR | Status: DC | PRN
  Administered 2014-12-05: 10 mL
  Filled 2014-12-05: qty 10

## 2014-12-05 MED ORDER — SODIUM CHLORIDE 0.9 % IV SOLN
Freq: Once | INTRAVENOUS | Status: AC
  Administered 2014-12-05: 10:00:00 via INTRAVENOUS

## 2014-12-05 MED ORDER — PALONOSETRON HCL INJECTION 0.25 MG/5ML
0.2500 mg | Freq: Once | INTRAVENOUS | Status: AC
  Administered 2014-12-05: 0.25 mg via INTRAVENOUS

## 2014-12-05 NOTE — Patient Instructions (Signed)
Carboplatin injection  What is this medicine?  CARBOPLATIN (KAR boe pla tin) is a chemotherapy drug. It targets fast dividing cells, like cancer cells, and causes these cells to die. This medicine is used to treat ovarian cancer and many other cancers.  This medicine may be used for other purposes; ask your health care provider or pharmacist if you have questions.  COMMON BRAND NAME(S): Paraplatin  What should I tell my health care provider before I take this medicine?  They need to know if you have any of these conditions:  -blood disorders  -hearing problems  -kidney disease  -recent or ongoing radiation therapy  -an unusual or allergic reaction to carboplatin, cisplatin, other chemotherapy, other medicines, foods, dyes, or preservatives  -pregnant or trying to get pregnant  -breast-feeding  How should I use this medicine?  This drug is usually given as an infusion into a vein. It is administered in a hospital or clinic by a specially trained health care professional.  Talk to your pediatrician regarding the use of this medicine in children. Special care may be needed.  Overdosage: If you think you have taken too much of this medicine contact a poison control center or emergency room at once.  NOTE: This medicine is only for you. Do not share this medicine with others.  What if I miss a dose?  It is important not to miss a dose. Call your doctor or health care professional if you are unable to keep an appointment.  What may interact with this medicine?  -medicines for seizures  -medicines to increase blood counts like filgrastim, pegfilgrastim, sargramostim  -some antibiotics like amikacin, gentamicin, neomycin, streptomycin, tobramycin  -vaccines  Talk to your doctor or health care professional before taking any of these medicines:  -acetaminophen  -aspirin  -ibuprofen  -ketoprofen  -naproxen  This list may not describe all possible interactions. Give your health care provider a list of all the medicines, herbs,  non-prescription drugs, or dietary supplements you use. Also tell them if you smoke, drink alcohol, or use illegal drugs. Some items may interact with your medicine.  What should I watch for while using this medicine?  Your condition will be monitored carefully while you are receiving this medicine. You will need important blood work done while you are taking this medicine.  This drug may make you feel generally unwell. This is not uncommon, as chemotherapy can affect healthy cells as well as cancer cells. Report any side effects. Continue your course of treatment even though you feel ill unless your doctor tells you to stop.  In some cases, you may be given additional medicines to help with side effects. Follow all directions for their use.  Call your doctor or health care professional for advice if you get a fever, chills or sore throat, or other symptoms of a cold or flu. Do not treat yourself. This drug decreases your body's ability to fight infections. Try to avoid being around people who are sick.  This medicine may increase your risk to bruise or bleed. Call your doctor or health care professional if you notice any unusual bleeding.  Be careful brushing and flossing your teeth or using a toothpick because you may get an infection or bleed more easily. If you have any dental work done, tell your dentist you are receiving this medicine.  Avoid taking products that contain aspirin, acetaminophen, ibuprofen, naproxen, or ketoprofen unless instructed by your doctor. These medicines may hide a fever.  Do not become   pregnant while taking this medicine. Women should inform their doctor if they wish to become pregnant or think they might be pregnant. There is a potential for serious side effects to an unborn child. Talk to your health care professional or pharmacist for more information. Do not breast-feed an infant while taking this medicine.  What side effects may I notice from receiving this medicine?  Side effects  that you should report to your doctor or health care professional as soon as possible:  -allergic reactions like skin rash, itching or hives, swelling of the face, lips, or tongue  -signs of infection - fever or chills, cough, sore throat, pain or difficulty passing urine  -signs of decreased platelets or bleeding - bruising, pinpoint red spots on the skin, black, tarry stools, nosebleeds  -signs of decreased red blood cells - unusually weak or tired, fainting spells, lightheadedness  -breathing problems  -changes in hearing  -changes in vision  -chest pain  -high blood pressure  -low blood counts - This drug may decrease the number of white blood cells, red blood cells and platelets. You may be at increased risk for infections and bleeding.  -nausea and vomiting  -pain, swelling, redness or irritation at the injection site  -pain, tingling, numbness in the hands or feet  -problems with balance, talking, walking  -trouble passing urine or change in the amount of urine  Side effects that usually do not require medical attention (report to your doctor or health care professional if they continue or are bothersome):  -hair loss  -loss of appetite  -metallic taste in the mouth or changes in taste  This list may not describe all possible side effects. Call your doctor for medical advice about side effects. You may report side effects to FDA at 1-800-FDA-1088.  Where should I keep my medicine?  This drug is given in a hospital or clinic and will not be stored at home.  NOTE: This sheet is a summary. It may not cover all possible information. If you have questions about this medicine, talk to your doctor, pharmacist, or health care provider.   2015, Elsevier/Gold Standard. (2007-06-02 14:38:05)  Nanoparticle Albumin-Bound Paclitaxel injection  What is this medicine?  NANOPARTICLE ALBUMIN-BOUND PACLITAXEL (Na no PAHR ti kuhl al BYOO muhn-bound PAK li TAX el) is a chemotherapy drug. It targets fast dividing cells, like  cancer cells, and causes these cells to die. This medicine is used to treat advanced breast cancer and advanced lung cancer.  This medicine may be used for other purposes; ask your health care provider or pharmacist if you have questions.  COMMON BRAND NAME(S): Abraxane  What should I tell my health care provider before I take this medicine?  They need to know if you have any of these conditions:  -kidney disease  -liver disease  -low blood counts, like low platelets, red blood cells, or white blood cells  -recent or ongoing radiation therapy  -an unusual or allergic reaction to paclitaxel, albumin, other chemotherapy, other medicines, foods, dyes, or preservatives  -pregnant or trying to get pregnant  -breast-feeding  How should I use this medicine?  This drug is given as an infusion into a vein. It is administered in a hospital or clinic by a specially trained health care professional.  Talk to your pediatrician regarding the use of this medicine in children. Special care may be needed.  Overdosage: If you think you have taken too much of this medicine contact a poison control center or emergency   room at once.  NOTE: This medicine is only for you. Do not share this medicine with others.  What if I miss a dose?  It is important not to miss your dose. Call your doctor or health care professional if you are unable to keep an appointment.  What may interact with this medicine?  -cyclosporine  -diazepam  -ketoconazole  -medicines to increase blood counts like filgrastim, pegfilgrastim, sargramostim  -other chemotherapy drugs like cisplatin, doxorubicin, epirubicin, etoposide, teniposide, vincristine  -quinidine  -testosterone  -vaccines  -verapamil  Talk to your doctor or health care professional before taking any of these medicines:  -acetaminophen  -aspirin  -ibuprofen  -ketoprofen  -naproxen  This list may not describe all possible interactions. Give your health care provider a list of all the medicines, herbs,  non-prescription drugs, or dietary supplements you use. Also tell them if you smoke, drink alcohol, or use illegal drugs. Some items may interact with your medicine.  What should I watch for while using this medicine?  Your condition will be monitored carefully while you are receiving this medicine. You will need important blood work done while you are taking this medicine.  This drug may make you feel generally unwell. This is not uncommon, as chemotherapy can affect healthy cells as well as cancer cells. Report any side effects. Continue your course of treatment even though you feel ill unless your doctor tells you to stop.  In some cases, you may be given additional medicines to help with side effects. Follow all directions for their use.  Call your doctor or health care professional for advice if you get a fever, chills or sore throat, or other symptoms of a cold or flu. Do not treat yourself. This drug decreases your body's ability to fight infections. Try to avoid being around people who are sick.  This medicine may increase your risk to bruise or bleed. Call your doctor or health care professional if you notice any unusual bleeding.  Be careful brushing and flossing your teeth or using a toothpick because you may get an infection or bleed more easily. If you have any dental work done, tell your dentist you are receiving this medicine.  Avoid taking products that contain aspirin, acetaminophen, ibuprofen, naproxen, or ketoprofen unless instructed by your doctor. These medicines may hide a fever.  Do not become pregnant while taking this medicine. Women should inform their doctor if they wish to become pregnant or think they might be pregnant. There is a potential for serious side effects to an unborn child. Talk to your health care professional or pharmacist for more information. Do not breast-feed an infant while taking this medicine.  Men are advised not to father a child while receiving this medicine.  What  side effects may I notice from receiving this medicine?  Side effects that you should report to your doctor or health care professional as soon as possible:  -allergic reactions like skin rash, itching or hives, swelling of the face, lips, or tongue  -low blood counts - This drug may decrease the number of white blood cells, red blood cells and platelets. You may be at increased risk for infections and bleeding.  -signs of infection - fever or chills, cough, sore throat, pain or difficulty passing urine  -signs of decreased platelets or bleeding - bruising, pinpoint red spots on the skin, black, tarry stools, nosebleeds  -signs of decreased red blood cells - unusually weak or tired, fainting spells, lightheadedness  -breathing problems  -  changes in vision  -chest pain  -high or low blood pressure  -mouth sores  -nausea and vomiting  -pain, swelling, redness or irritation at the injection site  -pain, tingling, numbness in the hands or feet  -slow or irregular heartbeat  -swelling of the ankle, feet, hands  Side effects that usually do not require medical attention (report to your doctor or health care professional if they continue or are bothersome):  -aches, pains  -changes in the color of fingernails  -diarrhea  -hair loss  -loss of appetite  This list may not describe all possible side effects. Call your doctor for medical advice about side effects. You may report side effects to FDA at 1-800-FDA-1088.  Where should I keep my medicine?  This drug is given in a hospital or clinic and will not be stored at home.  NOTE: This sheet is a summary. It may not cover all possible information. If you have questions about this medicine, talk to your doctor, pharmacist, or health care provider.   2015, Elsevier/Gold Standard. (2012-04-20 16:48:50)

## 2014-12-05 NOTE — Progress Notes (Signed)
Patient Care Team: Elisabeth Cara, PA-C as PCP - General (Family Medicine) [name], MD as Consulting Physician (General Surgery) Nicholas Lose, MD as Consulting Physician (Hematology and Oncology) Eppie Gibson, MD as Attending Physician (Radiation Oncology) Rockwell Germany, RN as Registered Nurse Mauro Kaufmann, RN as Registered Nurse  DIAGNOSIS: Breast cancer of lower-inner quadrant of right female breast   Staging form: Breast, AJCC 7th Edition     Clinical stage from 09/07/2014: Stage IIIC (T2, N3, M0) - Unsigned       Staging comments: Staged at breast conference on 6.29.16    SUMMARY OF ONCOLOGIC HISTORY:   Breast cancer of lower-inner quadrant of right female breast   08/29/2014 Mammogram Right breast mass 5 x 4.8 x 4.2 cm, 6 enlarged axillary lymph nodes including nodes in level III location, T2 N3 M0 stage IIIc clinical stage   08/31/2014 Initial Diagnosis Right breast biopsy 6:00: Invasive ductal carcinoma, 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 Today is cycle 4 of Abraxane and carboplatin. She has tolerated last chemotherapy fairly well. The week before she had a lot of nausea after the chemotherapy. Denies any neuropathy. She does feel tired often. She is still going to work full-time.  REVIEW OF SYSTEMS:   Constitutional: Denies fevers, chills or abnormal weight loss, complains of fatigue  Eyes: Denies blurriness of vision Ears, nose, mouth, throat, and face: Denies mucositis or sore throat Respiratory: Denies cough, dyspnea or wheezes Cardiovascular: Denies palpitation, chest discomfort or  lower extremity swelling Gastrointestinal:  Denies nausea, heartburn or change in bowel habits Skin: Denies abnormal skin rashes Lymphatics: Denies new lymphadenopathy or easy bruising Neurological:Denies numbness, tingling or new weaknesses Behavioral/Psych: Mood is stable, no new changes  Breast:  denies any pain or lumps or nodules in either breasts All other systems were reviewed with the patient and are negative.  I have reviewed the past medical history, past surgical history, social history and family history with the patient and they are unchanged from previous note.  ALLERGIES:  has No Known Allergies.  MEDICATIONS:  Current Outpatient Prescriptions  Medication Sig Dispense Refill  . dexamethasone (DECADRON) 4 MG tablet     . HYDROcodone-acetaminophen (NORCO/VICODIN) 5-325 MG per tablet Take 1-2 tablets by mouth every 6 (six) hours as needed. 30 tablet 0  . lidocaine-prilocaine (EMLA) cream Apply to affected area once 30 g 3  . LORazepam (ATIVAN) 0.5 MG tablet Take 1 tablet (0.5 mg total) by mouth at bedtime. 30 tablet 0  . naproxen sodium (RA NAPROXEN SODIUM) 220 MG tablet Take 440 mg by mouth.    . ondansetron (ZOFRAN) 8 MG tablet Take 1 tablet (8 mg total) by mouth 2 (two) times daily as needed. Start on the third day after chemotherapy. 30 tablet 1  . prochlorperazine (COMPAZINE) 10 MG tablet Take 1 tablet (10 mg total) by mouth every 6 (six) hours as needed (Nausea or vomiting). 30 tablet 1   No current facility-administered medications for this visit.    PHYSICAL EXAMINATION: ECOG PERFORMANCE STATUS: 1 - Symptomatic but completely ambulatory  Filed Vitals:   12/05/14 0825  BP: 109/69  Pulse: 94  Temp:  94 F (34.4 C)  Resp: 18   Filed Weights   12/05/14 0825  Weight: 175 lb 4.8 oz (79.516 kg)    GENERAL:alert, no distress and comfortable SKIN: skin color, texture, turgor are normal, no rashes or significant lesions EYES: normal, Conjunctiva are pink and  non-injected, sclera clear OROPHARYNX:no exudate, no erythema and lips, buccal mucosa, and tongue normal  NECK: supple, thyroid normal size, non-tender, without nodularity LYMPH:  no palpable lymphadenopathy in the cervical, axillary or inguinal LUNGS: clear to auscultation and percussion with normal breathing effort HEART: regular rate & rhythm and no murmurs and no lower extremity edema ABDOMEN:abdomen soft, non-tender and normal bowel sounds Musculoskeletal:no cyanosis of digits and no clubbing  NEURO: alert & oriented x 3 with fluent speech, no focal motor/sensory deficits  LABORATORY DATA:  I have reviewed the data as listed   Chemistry      Component Value Date/Time   NA 143 11/28/2014 1010   K 3.8 11/28/2014 1010   CO2 27 11/28/2014 1010   BUN 8.8 11/28/2014 1010   CREATININE 0.7 11/28/2014 1010      Component Value Date/Time   CALCIUM 9.3 11/28/2014 1010   ALKPHOS 52 11/28/2014 1010   AST 14 11/28/2014 1010   ALT 15 11/28/2014 1010   BILITOT 0.28 11/28/2014 1010       Lab Results  Component Value Date   WBC 3.9 12/05/2014   HGB 9.8* 12/05/2014   HCT 28.6* 12/05/2014   MCV 89.5 12/05/2014   PLT 230 12/05/2014   NEUTROABS 2.2 12/05/2014    ASSESSMENT & PLAN:  Breast cancer of lower-inner quadrant of right female breast Right breast mass 5 x 4.8 x 4.2 cm, 6 enlarged axillary lymph nodes including nodes in level III location, T2 N3 M0 stage IIIc clinical stage Right breast biopsy 6:00: Invasive ductal carcinoma, 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 Neo-adjuvant chemotherapy with dose dense Adriamycin and Cytoxan 4 followed by weekly Abraxane and carboplatin 12 2. Followed by surgery 3. Followed by radiation therapy  Current Treatment: completed 4 cycles of adriamycin and Cytoxan, today is cycle 4/12 Abraxane and carboplatin Chemo Toxicities: 1. Constipation:  Improved with Miralax 2. alopecia 3. Grade 3 neutropenia nadir counts check ANC 0.2, decreased dose of chemotherapy for cycle 2 Adriamycin and Cytoxan 4. Anemia due to chemotherapy: monitoring hemoglobin asymptomatic 5. Skin and nail discoloration: Due to chemotherapy, especially on the hands 6. Severe fatigue: Unfortunately this is due to chemotherapy and not much can be done to help improve her fatigue. I recommended that she increase her protein intake in the diet. 7. Anemia due to chemotherapy:improving on Abraxane  Monitoring closely for toxicities RTC in 2 week for cycle 6/12 Abraxane carboplatin   No orders of the defined types were placed in this encounter.   The patient has a good understanding of the overall plan. she agrees with it. she will call with any problems that may develop before the next visit here.   Rulon Eisenmenger, MD

## 2014-12-12 ENCOUNTER — Other Ambulatory Visit: Payer: 59

## 2014-12-12 ENCOUNTER — Ambulatory Visit (HOSPITAL_BASED_OUTPATIENT_CLINIC_OR_DEPARTMENT_OTHER): Payer: 59

## 2014-12-12 ENCOUNTER — Ambulatory Visit: Payer: 59

## 2014-12-12 ENCOUNTER — Other Ambulatory Visit (HOSPITAL_BASED_OUTPATIENT_CLINIC_OR_DEPARTMENT_OTHER): Payer: 59

## 2014-12-12 VITALS — BP 128/77 | HR 72 | Temp 97.6°F | Resp 18

## 2014-12-12 DIAGNOSIS — Z95828 Presence of other vascular implants and grafts: Secondary | ICD-10-CM

## 2014-12-12 DIAGNOSIS — Z5111 Encounter for antineoplastic chemotherapy: Secondary | ICD-10-CM | POA: Diagnosis not present

## 2014-12-12 DIAGNOSIS — C50311 Malignant neoplasm of lower-inner quadrant of right female breast: Secondary | ICD-10-CM

## 2014-12-12 LAB — CBC WITH DIFFERENTIAL/PLATELET
BASO%: 0.4 % (ref 0.0–2.0)
BASOS ABS: 0 10*3/uL (ref 0.0–0.1)
EOS%: 0.5 % (ref 0.0–7.0)
Eosinophils Absolute: 0 10*3/uL (ref 0.0–0.5)
HEMATOCRIT: 27 % — AB (ref 34.8–46.6)
HEMOGLOBIN: 9.1 g/dL — AB (ref 11.6–15.9)
LYMPH#: 1.5 10*3/uL (ref 0.9–3.3)
LYMPH%: 34.3 % (ref 14.0–49.7)
MCH: 30.6 pg (ref 25.1–34.0)
MCHC: 33.9 g/dL (ref 31.5–36.0)
MCV: 90.3 fL (ref 79.5–101.0)
MONO#: 0.4 10*3/uL (ref 0.1–0.9)
MONO%: 9.9 % (ref 0.0–14.0)
NEUT#: 2.3 10*3/uL (ref 1.5–6.5)
NEUT%: 54.9 % (ref 38.4–76.8)
PLATELETS: 160 10*3/uL (ref 145–400)
RBC: 2.99 10*6/uL — ABNORMAL LOW (ref 3.70–5.45)
RDW: 20.1 % — AB (ref 11.2–14.5)
WBC: 4.3 10*3/uL (ref 3.9–10.3)

## 2014-12-12 LAB — COMPREHENSIVE METABOLIC PANEL (CC13)
ALBUMIN: 3.4 g/dL — AB (ref 3.5–5.0)
ALK PHOS: 67 U/L (ref 40–150)
ALT: 15 U/L (ref 0–55)
ANION GAP: 9 meq/L (ref 3–11)
AST: 15 U/L (ref 5–34)
BUN: 6.3 mg/dL — AB (ref 7.0–26.0)
CALCIUM: 8.9 mg/dL (ref 8.4–10.4)
CHLORIDE: 109 meq/L (ref 98–109)
CO2: 23 mEq/L (ref 22–29)
CREATININE: 0.7 mg/dL (ref 0.6–1.1)
EGFR: >90 mL/min/{1.73_m2} (ref >90)
Glucose: 118 mg/dl (ref 70–140)
Potassium: 3.7 mEq/L (ref 3.5–5.1)
Sodium: 141 mEq/L (ref 136–145)
Total Bilirubin: 0.31 mg/dL (ref 0.20–1.20)
Total Protein: 6.8 g/dL (ref 6.4–8.3)

## 2014-12-12 MED ORDER — PALONOSETRON HCL INJECTION 0.25 MG/5ML
0.2500 mg | Freq: Once | INTRAVENOUS | Status: AC
  Administered 2014-12-12: 0.25 mg via INTRAVENOUS

## 2014-12-12 MED ORDER — SODIUM CHLORIDE 0.9 % IJ SOLN
10.0000 mL | INTRAMUSCULAR | Status: DC | PRN
  Administered 2014-12-12: 10 mL
  Filled 2014-12-12: qty 10

## 2014-12-12 MED ORDER — HEPARIN SOD (PORK) LOCK FLUSH 100 UNIT/ML IV SOLN
500.0000 [IU] | Freq: Once | INTRAVENOUS | Status: AC | PRN
  Administered 2014-12-12: 500 [IU]
  Filled 2014-12-12: qty 5

## 2014-12-12 MED ORDER — PALONOSETRON HCL INJECTION 0.25 MG/5ML
INTRAVENOUS | Status: AC
  Filled 2014-12-12: qty 5

## 2014-12-12 MED ORDER — SODIUM CHLORIDE 0.9 % IJ SOLN
10.0000 mL | INTRAMUSCULAR | Status: DC | PRN
  Administered 2014-12-12: 10 mL via INTRAVENOUS
  Filled 2014-12-12: qty 10

## 2014-12-12 MED ORDER — SODIUM CHLORIDE 0.9 % IV SOLN
Freq: Once | INTRAVENOUS | Status: AC
  Administered 2014-12-12: 10:00:00 via INTRAVENOUS

## 2014-12-12 MED ORDER — SODIUM CHLORIDE 0.9 % IV SOLN
277.8000 mg | Freq: Once | INTRAVENOUS | Status: AC
  Administered 2014-12-12: 280 mg via INTRAVENOUS
  Filled 2014-12-12: qty 28

## 2014-12-12 MED ORDER — PACLITAXEL PROTEIN-BOUND CHEMO INJECTION 100 MG
65.0000 mg/m2 | Freq: Once | INTRAVENOUS | Status: AC
  Administered 2014-12-12: 125 mg via INTRAVENOUS
  Filled 2014-12-12: qty 25

## 2014-12-12 NOTE — Patient Instructions (Signed)
[facility] Discharge Instructions for Patients Receiving Chemotherapy  Today you received the following chemotherapy agents Abraxane/Carboplatin.  To help prevent nausea and vomiting after your treatment, we encourage you to take your nausea medication as directed.   If you develop nausea and vomiting that is not controlled by your nausea medication, call the clinic.   BELOW ARE SYMPTOMS THAT SHOULD BE REPORTED IMMEDIATELY:  *FEVER GREATER THAN 100.5 F  *CHILLS WITH OR WITHOUT FEVER  NAUSEA AND VOMITING THAT IS NOT CONTROLLED WITH YOUR NAUSEA MEDICATION  *UNUSUAL SHORTNESS OF BREATH  *UNUSUAL BRUISING OR BLEEDING  TENDERNESS IN MOUTH AND THROAT WITH OR WITHOUT PRESENCE OF ULCERS  *URINARY PROBLEMS  *BOWEL PROBLEMS  UNUSUAL RASH Items with * indicate a potential emergency and should be followed up as soon as possible.  Feel free to call the clinic you have any questions or concerns. The clinic phone number is [phone].  Please show the CHEMO ALERT CARD at check-in to the Emergency Department and triage nurse.    

## 2014-12-12 NOTE — Patient Instructions (Signed)

## 2014-12-18 NOTE — Assessment & Plan Note (Signed)
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 Neo-adjuvant chemotherapy with dose dense Adriamycin and Cytoxan 4 followed by weekly Abraxane and carboplatin 12 2. Followed by surgery 3. Followed by radiation therapy  Current Treatment: completed 4 cycles of adriamycin and Cytoxan, today is cycle 6/12 Abraxane and carboplatin Chemo Toxicities: 1. Constipation: Improved with Miralax 2. alopecia 3. Grade 3 neutropenia nadir counts check ANC 0.2, decreased dose of chemotherapy for cycle 2 Adriamycin and Cytoxan 4. Anemia due to chemotherapy: monitoring hemoglobin asymptomatic 5. Skin and nail discoloration: Due to chemotherapy, especially on the hands 6. Severe fatigue: Unfortunately this is due to chemotherapy and not much can be done to help improve her fatigue. I recommended that she increase her protein intake in the diet. 7. Anemia due to chemotherapy:improving on Abraxane  Monitoring closely for toxicities RTC in 2 weeks for cycle 8/12 Abraxane carboplatin

## 2014-12-19 ENCOUNTER — Other Ambulatory Visit: Payer: 59

## 2014-12-19 ENCOUNTER — Other Ambulatory Visit (HOSPITAL_BASED_OUTPATIENT_CLINIC_OR_DEPARTMENT_OTHER): Payer: 59

## 2014-12-19 ENCOUNTER — Encounter: Payer: Self-pay | Admitting: Hematology and Oncology

## 2014-12-19 ENCOUNTER — Ambulatory Visit (HOSPITAL_BASED_OUTPATIENT_CLINIC_OR_DEPARTMENT_OTHER): Payer: 59

## 2014-12-19 ENCOUNTER — Ambulatory Visit: Payer: 59

## 2014-12-19 ENCOUNTER — Ambulatory Visit (HOSPITAL_BASED_OUTPATIENT_CLINIC_OR_DEPARTMENT_OTHER): Payer: 59 | Admitting: Hematology and Oncology

## 2014-12-19 VITALS — BP 113/73 | HR 86 | Temp 98.0°F | Resp 18 | Ht 67.0 in | Wt 172.0 lb

## 2014-12-19 DIAGNOSIS — C50311 Malignant neoplasm of lower-inner quadrant of right female breast: Secondary | ICD-10-CM

## 2014-12-19 DIAGNOSIS — Z5111 Encounter for antineoplastic chemotherapy: Secondary | ICD-10-CM

## 2014-12-19 DIAGNOSIS — C773 Secondary and unspecified malignant neoplasm of axilla and upper limb lymph nodes: Secondary | ICD-10-CM | POA: Diagnosis not present

## 2014-12-19 DIAGNOSIS — K5900 Constipation, unspecified: Secondary | ICD-10-CM | POA: Diagnosis not present

## 2014-12-19 DIAGNOSIS — R530 Neoplastic (malignant) related fatigue: Secondary | ICD-10-CM

## 2014-12-19 DIAGNOSIS — L608 Other nail disorders: Secondary | ICD-10-CM

## 2014-12-19 DIAGNOSIS — D6481 Anemia due to antineoplastic chemotherapy: Secondary | ICD-10-CM

## 2014-12-19 DIAGNOSIS — Z171 Estrogen receptor negative status [ER-]: Secondary | ICD-10-CM | POA: Diagnosis not present

## 2014-12-19 LAB — CBC WITH DIFFERENTIAL/PLATELET
BASO%: 0.6 % (ref 0.0–2.0)
BASOS ABS: 0 10*3/uL (ref 0.0–0.1)
EOS ABS: 0 10*3/uL (ref 0.0–0.5)
EOS%: 0.3 % (ref 0.0–7.0)
HCT: 27.6 % — ABNORMAL LOW (ref 34.8–46.6)
HEMOGLOBIN: 9.6 g/dL — AB (ref 11.6–15.9)
LYMPH%: 33 % (ref 14.0–49.7)
MCH: 31.3 pg (ref 25.1–34.0)
MCHC: 34.9 g/dL (ref 31.5–36.0)
MCV: 89.7 fL (ref 79.5–101.0)
MONO#: 0.4 10*3/uL (ref 0.1–0.9)
MONO%: 8.1 % (ref 0.0–14.0)
NEUT#: 2.5 10*3/uL (ref 1.5–6.5)
NEUT%: 58 % (ref 38.4–76.8)
PLATELETS: 193 10*3/uL (ref 145–400)
RBC: 3.08 10*6/uL — AB (ref 3.70–5.45)
RDW: 19.8 % — ABNORMAL HIGH (ref 11.2–14.5)
WBC: 4.4 10*3/uL (ref 3.9–10.3)
lymph#: 1.5 10*3/uL (ref 0.9–3.3)

## 2014-12-19 LAB — COMPREHENSIVE METABOLIC PANEL (CC13)
ALBUMIN: 3.5 g/dL (ref 3.5–5.0)
ALK PHOS: 77 U/L (ref 40–150)
ALT: 11 U/L (ref 0–55)
ANION GAP: 11 meq/L (ref 3–11)
AST: 12 U/L (ref 5–34)
BUN: 7.5 mg/dL (ref 7.0–26.0)
CO2: 21 mEq/L — ABNORMAL LOW (ref 22–29)
Calcium: 9.5 mg/dL (ref 8.4–10.4)
Chloride: 107 mEq/L (ref 98–109)
Creatinine: 0.7 mg/dL (ref 0.6–1.1)
GLUCOSE: 131 mg/dL (ref 70–140)
POTASSIUM: 3.5 meq/L (ref 3.5–5.1)
SODIUM: 139 meq/L (ref 136–145)
Total Bilirubin: <0.3 mg/dL (ref 0.20–1.20)
Total Protein: 7.4 g/dL (ref 6.4–8.3)

## 2014-12-19 MED ORDER — SODIUM CHLORIDE 0.9 % IV SOLN
Freq: Once | INTRAVENOUS | Status: AC
  Administered 2014-12-19: 10:00:00 via INTRAVENOUS

## 2014-12-19 MED ORDER — HEPARIN SOD (PORK) LOCK FLUSH 100 UNIT/ML IV SOLN
500.0000 [IU] | Freq: Once | INTRAVENOUS | Status: AC | PRN
  Administered 2014-12-19: 500 [IU]
  Filled 2014-12-19: qty 5

## 2014-12-19 MED ORDER — PACLITAXEL PROTEIN-BOUND CHEMO INJECTION 100 MG
65.0000 mg/m2 | Freq: Once | INTRAVENOUS | Status: AC
  Administered 2014-12-19: 125 mg via INTRAVENOUS
  Filled 2014-12-19: qty 25

## 2014-12-19 MED ORDER — SODIUM CHLORIDE 0.9 % IJ SOLN
10.0000 mL | INTRAMUSCULAR | Status: DC | PRN
  Administered 2014-12-19: 10 mL
  Filled 2014-12-19: qty 10

## 2014-12-19 MED ORDER — PALONOSETRON HCL INJECTION 0.25 MG/5ML
INTRAVENOUS | Status: AC
  Filled 2014-12-19: qty 5

## 2014-12-19 MED ORDER — PALONOSETRON HCL INJECTION 0.25 MG/5ML
0.2500 mg | Freq: Once | INTRAVENOUS | Status: AC
  Administered 2014-12-19: 0.25 mg via INTRAVENOUS

## 2014-12-19 MED ORDER — SODIUM CHLORIDE 0.9 % IV SOLN
277.8000 mg | Freq: Once | INTRAVENOUS | Status: AC
  Administered 2014-12-19: 280 mg via INTRAVENOUS
  Filled 2014-12-19: qty 28

## 2014-12-19 NOTE — Patient Instructions (Signed)
[facility] Discharge Instructions for Patients Receiving Chemotherapy  Today you received the following chemotherapy agents abraxane/carboplatin  To help prevent nausea and vomiting after your treatment, we encourage you to take your nausea medication as directed   If you develop nausea and vomiting that is not controlled by your nausea medication, call the clinic.   BELOW ARE SYMPTOMS THAT SHOULD BE REPORTED IMMEDIATELY:  *FEVER GREATER THAN 100.5 F  *CHILLS WITH OR WITHOUT FEVER  NAUSEA AND VOMITING THAT IS NOT CONTROLLED WITH YOUR NAUSEA MEDICATION  *UNUSUAL SHORTNESS OF BREATH  *UNUSUAL BRUISING OR BLEEDING  TENDERNESS IN MOUTH AND THROAT WITH OR WITHOUT PRESENCE OF ULCERS  *URINARY PROBLEMS  *BOWEL PROBLEMS  UNUSUAL RASH Items with * indicate a potential emergency and should be followed up as soon as possible.  Feel free to call the clinic you have any questions or concerns. The clinic phone number is [phone].  

## 2014-12-19 NOTE — Progress Notes (Signed)
Patient Care Team: Elisabeth Cara, PA-C as PCP - General (Family Medicine) [name], MD as Consulting Physician (General Surgery) Nicholas Lose, MD as Consulting Physician (Hematology and Oncology) Eppie Gibson, MD as Attending Physician (Radiation Oncology) Rockwell Germany, RN as Registered Nurse Mauro Kaufmann, RN as Registered Nurse  DIAGNOSIS: Breast cancer of lower-inner quadrant of right female breast [facility])   Staging form: Breast, AJCC 7th Edition     Clinical stage from 09/07/2014: Stage IIIC (T2, N3, M0) - Unsigned       Staging comments: Staged at breast conference on 6.29.16    SUMMARY OF ONCOLOGIC HISTORY:   Breast cancer of lower-inner quadrant of right female breast ([place])   08/29/2014 Mammogram Right breast mass 5 x 4.8 x 4.2 cm, 6 enlarged axillary lymph nodes including nodes in level III location, T2 N3 M0 stage IIIc clinical stage   08/31/2014 Initial Diagnosis Right breast biopsy 6:00: Invasive ductal carcinoma, 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 She is tolerating the treatment fairly well. Her major complaint is intermittent fatigue. It lasts for 2-3 days and then she recovers. Her taste is not come back. Denies any nausea or vomiting. Denies any neuropathy of hands or feet.  REVIEW OF SYSTEMS:   Constitutional: Denies fevers, chills or abnormal weight loss, complains of fatigue Eyes: Denies blurriness of vision Ears, nose, mouth, throat, and face: Denies mucositis or sore throat Respiratory: Denies cough, dyspnea or wheezes Cardiovascular: Denies  palpitation, chest discomfort or lower extremity swelling Gastrointestinal:  Denies nausea, heartburn or change in bowel habits Skin: Denies abnormal skin rashes Lymphatics: Denies new lymphadenopathy or easy bruising Neurological:Denies numbness, tingling or new weaknesses Behavioral/Psych: Mood is stable, no new changes   All other systems were reviewed with the patient and are negative.  I have reviewed the past medical history, past surgical history, social history and family history with the patient and they are unchanged from previous note.  ALLERGIES:  has No Known Allergies.  MEDICATIONS:  Current Outpatient Prescriptions  Medication Sig Dispense Refill  . dexamethasone (DECADRON) 4 MG tablet     . HYDROcodone-acetaminophen (NORCO/VICODIN) 5-325 MG per tablet Take 1-2 tablets by mouth every 6 (six) hours as needed. 30 tablet 0  . lidocaine-prilocaine (EMLA) cream Apply to affected area once 30 g 3  . LORazepam (ATIVAN) 0.5 MG tablet Take 1 tablet (0.5 mg total) by mouth at bedtime. 30 tablet 0  . naproxen sodium (RA NAPROXEN SODIUM) 220 MG tablet Take 440 mg by mouth.    . ondansetron (ZOFRAN) 8 MG tablet Take 1 tablet (8 mg total) by mouth 2 (two) times daily as needed. Start on the third day after chemotherapy. 30 tablet 1  . prochlorperazine (COMPAZINE) 10 MG tablet Take 1 tablet (10 mg total) by mouth every 6 (six) hours as needed (Nausea or vomiting). 30 tablet 1   No current facility-administered medications for this visit.    PHYSICAL EXAMINATION: ECOG PERFORMANCE STATUS: 1 - Symptomatic but completely ambulatory  Filed Vitals:   12/19/14 0916  BP: 113/73  Pulse: 86  Temp: 98 F (36.7 C)  Resp: 18  Filed Weights   12/19/14 0913 12/19/14 0916  Weight: 172 lb 11.2 oz (78.336 kg) 109 lb 14.4 oz (49.85 kg)    GENERAL:alert, no distress and comfortable SKIN: skin color, texture, turgor are normal, no rashes or significant lesions EYES: normal, Conjunctiva are  pink and non-injected, sclera clear OROPHARYNX:no exudate, no erythema and lips, buccal mucosa, and tongue normal  NECK: supple, thyroid normal size, non-tender, without nodularity LYMPH:  no palpable lymphadenopathy in the cervical, axillary or inguinal LUNGS: clear to auscultation and percussion with normal breathing effort HEART: regular rate & rhythm and no murmurs and no lower extremity edema ABDOMEN:abdomen soft, non-tender and normal bowel sounds Musculoskeletal:no cyanosis of digits and no clubbing  NEURO: alert & oriented x 3 with fluent speech, no focal motor/sensory deficits  LABORATORY DATA:  I have reviewed the data as listed   Chemistry      Component Value Date/Time   NA 141 12/12/2014 0902   K 3.7 12/12/2014 0902   CO2 23 12/12/2014 0902   BUN 6.3* 12/12/2014 0902   CREATININE 0.7 12/12/2014 0902      Component Value Date/Time   CALCIUM 8.9 12/12/2014 0902   ALKPHOS 67 12/12/2014 0902   AST 15 12/12/2014 0902   ALT 15 12/12/2014 0902   BILITOT 0.31 12/12/2014 0902       Lab Results  Component Value Date   WBC 4.4 12/19/2014   HGB 9.6* 12/19/2014   HCT 27.6* 12/19/2014   MCV 89.7 12/19/2014   PLT 193 12/19/2014   NEUTROABS 2.5 12/19/2014   ASSESSMENT & PLAN:  Breast cancer of lower-inner quadrant of right female breast Right breast mass 5 x 4.8 x 4.2 cm, 6 enlarged axillary lymph nodes including nodes in level III location, T2 N3 M0 stage IIIc clinical stage Right breast biopsy 6:00: Invasive ductal carcinoma, 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 Neo-adjuvant chemotherapy with dose dense Adriamycin and Cytoxan 4 followed by weekly Abraxane and carboplatin 12 2. Followed by surgery 3. Followed by radiation therapy  Current Treatment: completed 4 cycles of adriamycin and Cytoxan, today is cycle 6/12 Abraxane and carboplatin Chemo Toxicities: 1.  Constipation: Improved with Miralax 2. alopecia 3. Grade 3 neutropenia nadir counts check ANC 0.2, decreased dose of chemotherapy for cycle 2 Adriamycin and Cytoxan 4. Anemia due to chemotherapy: monitoring hemoglobin asymptomatic 5. Skin and nail discoloration: Due to chemotherapy, especially on the hands 6. Severe fatigue: Unfortunately this is due to chemotherapy and not much can be done to help improve her fatigue. I recommended that she increase her protein intake in the diet. 7. Anemia due to chemotherapy:improving on Abraxane  Monitoring closely for toxicities RTC in 2 weeks for cycle 8/12 Abraxane carboplatin  No orders of the defined types were placed in this encounter.   The patient has a good understanding of the overall plan. she agrees with it. she will call with any problems that may develop before the next visit here.   Rulon Eisenmenger, MD

## 2014-12-26 ENCOUNTER — Ambulatory Visit (HOSPITAL_BASED_OUTPATIENT_CLINIC_OR_DEPARTMENT_OTHER): Payer: 59

## 2014-12-26 ENCOUNTER — Other Ambulatory Visit (HOSPITAL_BASED_OUTPATIENT_CLINIC_OR_DEPARTMENT_OTHER): Payer: 59

## 2014-12-26 ENCOUNTER — Ambulatory Visit: Payer: 59

## 2014-12-26 ENCOUNTER — Other Ambulatory Visit: Payer: Self-pay | Admitting: Hematology and Oncology

## 2014-12-26 VITALS — BP 110/79 | HR 89 | Temp 97.0°F | Resp 18

## 2014-12-26 DIAGNOSIS — Z5111 Encounter for antineoplastic chemotherapy: Secondary | ICD-10-CM | POA: Diagnosis not present

## 2014-12-26 DIAGNOSIS — C50311 Malignant neoplasm of lower-inner quadrant of right female breast: Secondary | ICD-10-CM

## 2014-12-26 DIAGNOSIS — Z95828 Presence of other vascular implants and grafts: Secondary | ICD-10-CM

## 2014-12-26 LAB — CBC WITH DIFFERENTIAL/PLATELET
BASO%: 0.3 % (ref 0.0–2.0)
Basophils Absolute: 0 10*3/uL (ref 0.0–0.1)
EOS ABS: 0 10*3/uL (ref 0.0–0.5)
EOS%: 0.3 % (ref 0.0–7.0)
HCT: 25 % — ABNORMAL LOW (ref 34.8–46.6)
HGB: 8.4 g/dL — ABNORMAL LOW (ref 11.6–15.9)
LYMPH%: 36.9 % (ref 14.0–49.7)
MCH: 31 pg (ref 25.1–34.0)
MCHC: 33.6 g/dL (ref 31.5–36.0)
MCV: 92.3 fL (ref 79.5–101.0)
MONO#: 0.4 10*3/uL (ref 0.1–0.9)
MONO%: 10.9 % (ref 0.0–14.0)
NEUT#: 1.8 10*3/uL (ref 1.5–6.5)
NEUT%: 51.6 % (ref 38.4–76.8)
PLATELETS: 132 10*3/uL — AB (ref 145–400)
RBC: 2.71 10*6/uL — AB (ref 3.70–5.45)
RDW: 17.3 % — ABNORMAL HIGH (ref 11.2–14.5)
WBC: 3.4 10*3/uL — AB (ref 3.9–10.3)
lymph#: 1.3 10*3/uL (ref 0.9–3.3)

## 2014-12-26 LAB — COMPREHENSIVE METABOLIC PANEL (CC13)
ALT: 12 U/L (ref 0–55)
ANION GAP: 8 meq/L (ref 3–11)
AST: 16 U/L (ref 5–34)
Albumin: 3.4 g/dL — ABNORMAL LOW (ref 3.5–5.0)
Alkaline Phosphatase: 70 U/L (ref 40–150)
BUN: 5.7 mg/dL — ABNORMAL LOW (ref 7.0–26.0)
CHLORIDE: 107 meq/L (ref 98–109)
CO2: 26 meq/L (ref 22–29)
Calcium: 9.1 mg/dL (ref 8.4–10.4)
Creatinine: 0.7 mg/dL (ref 0.6–1.1)
GLUCOSE: 108 mg/dL (ref 70–140)
Potassium: 3.5 mEq/L (ref 3.5–5.1)
SODIUM: 141 meq/L (ref 136–145)
Total Bilirubin: 0.39 mg/dL (ref 0.20–1.20)
Total Protein: 6.8 g/dL (ref 6.4–8.3)

## 2014-12-26 MED ORDER — SODIUM CHLORIDE 0.9 % IV SOLN
277.8000 mg | Freq: Once | INTRAVENOUS | Status: AC
  Administered 2014-12-26: 280 mg via INTRAVENOUS
  Filled 2014-12-26: qty 28

## 2014-12-26 MED ORDER — PALONOSETRON HCL INJECTION 0.25 MG/5ML
0.2500 mg | Freq: Once | INTRAVENOUS | Status: AC
  Administered 2014-12-26: 0.25 mg via INTRAVENOUS

## 2014-12-26 MED ORDER — PACLITAXEL PROTEIN-BOUND CHEMO INJECTION 100 MG
65.0000 mg/m2 | Freq: Once | INTRAVENOUS | Status: AC
  Administered 2014-12-26: 125 mg via INTRAVENOUS
  Filled 2014-12-26: qty 25

## 2014-12-26 MED ORDER — SODIUM CHLORIDE 0.9 % IJ SOLN
10.0000 mL | INTRAMUSCULAR | Status: DC | PRN
  Administered 2014-12-26: 10 mL
  Filled 2014-12-26: qty 10

## 2014-12-26 MED ORDER — SODIUM CHLORIDE 0.9 % IV SOLN
Freq: Once | INTRAVENOUS | Status: AC
  Administered 2014-12-26: 10:00:00 via INTRAVENOUS

## 2014-12-26 MED ORDER — SODIUM CHLORIDE 0.9 % IJ SOLN
10.0000 mL | INTRAMUSCULAR | Status: DC | PRN
  Administered 2014-12-26: 10 mL via INTRAVENOUS
  Filled 2014-12-26: qty 10

## 2014-12-26 MED ORDER — PALONOSETRON HCL INJECTION 0.25 MG/5ML
INTRAVENOUS | Status: AC
  Filled 2014-12-26: qty 5

## 2014-12-26 MED ORDER — HEPARIN SOD (PORK) LOCK FLUSH 100 UNIT/ML IV SOLN
500.0000 [IU] | Freq: Once | INTRAVENOUS | Status: AC | PRN
  Administered 2014-12-26: 500 [IU]
  Filled 2014-12-26: qty 5

## 2014-12-26 NOTE — Patient Instructions (Signed)

## 2014-12-26 NOTE — Patient Instructions (Signed)
[facility] Discharge Instructions for Patients Receiving Chemotherapy  Today you received the following chemotherapy agents: Abraxane and Carboplatin  To help prevent nausea and vomiting after your treatment, we encourage you to take your nausea medication: Compazine 10 mg every 6 hours as needed.   If you develop nausea and vomiting that is not controlled by your nausea medication, call the clinic.   BELOW ARE SYMPTOMS THAT SHOULD BE REPORTED IMMEDIATELY:  *FEVER GREATER THAN 100.5 F  *CHILLS WITH OR WITHOUT FEVER  NAUSEA AND VOMITING THAT IS NOT CONTROLLED WITH YOUR NAUSEA MEDICATION  *UNUSUAL SHORTNESS OF BREATH  *UNUSUAL BRUISING OR BLEEDING  TENDERNESS IN MOUTH AND THROAT WITH OR WITHOUT PRESENCE OF ULCERS  *URINARY PROBLEMS  *BOWEL PROBLEMS  UNUSUAL RASH Items with * indicate a potential emergency and should be followed up as soon as possible.  Feel free to call the clinic you have any questions or concerns. The clinic phone number is [phone].  Please show the CHEMO ALERT CARD at check-in to the Emergency Department and triage nurse.   

## 2015-01-01 NOTE — Assessment & Plan Note (Signed)
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 Neo-adjuvant chemotherapy with dose dense Adriamycin and Cytoxan 4 followed by weekly Abraxane and carboplatin 12 2. Followed by surgery 3. Followed by radiation therapy  Current Treatment: completed 4 cycles of adriamycin and Cytoxan, today is cycle 8/12 Abraxane and carboplatin Chemo Toxicities: 1. Constipation: Improved with Miralax 2. alopecia 3. Grade 3 neutropenia nadir counts check ANC 0.2, decreased dose of chemotherapy for cycle 2 Adriamycin and Cytoxan 4. Anemia due to chemotherapy: monitoring hemoglobin asymptomatic 5. Skin and nail discoloration: Due to chemotherapy, especially on the hands 6. Severe fatigue: Unfortunately this is due to chemotherapy and not much can be done to help improve her fatigue. I recommended that she increase her protein intake in the diet. 7. Anemia due to chemotherapy:improving on Abraxane  Monitoring closely for toxicities RTC in 2 weeks for cycle 10/12 Abraxane carboplatin

## 2015-01-02 ENCOUNTER — Ambulatory Visit: Payer: 59

## 2015-01-02 ENCOUNTER — Other Ambulatory Visit: Payer: 59

## 2015-01-02 ENCOUNTER — Other Ambulatory Visit: Payer: Self-pay | Admitting: *Deleted

## 2015-01-02 ENCOUNTER — Ambulatory Visit (HOSPITAL_BASED_OUTPATIENT_CLINIC_OR_DEPARTMENT_OTHER): Payer: 59

## 2015-01-02 ENCOUNTER — Ambulatory Visit (HOSPITAL_BASED_OUTPATIENT_CLINIC_OR_DEPARTMENT_OTHER): Payer: 59 | Admitting: Hematology and Oncology

## 2015-01-02 ENCOUNTER — Encounter: Payer: Self-pay | Admitting: Hematology and Oncology

## 2015-01-02 ENCOUNTER — Telehealth: Payer: Self-pay | Admitting: Hematology and Oncology

## 2015-01-02 ENCOUNTER — Encounter: Payer: Self-pay | Admitting: *Deleted

## 2015-01-02 VITALS — BP 122/71 | HR 101 | Temp 98.4°F | Resp 18 | Ht 67.0 in | Wt 171.2 lb

## 2015-01-02 DIAGNOSIS — D696 Thrombocytopenia, unspecified: Secondary | ICD-10-CM

## 2015-01-02 DIAGNOSIS — C50311 Malignant neoplasm of lower-inner quadrant of right female breast: Secondary | ICD-10-CM

## 2015-01-02 DIAGNOSIS — Z5111 Encounter for antineoplastic chemotherapy: Secondary | ICD-10-CM | POA: Diagnosis not present

## 2015-01-02 DIAGNOSIS — Z171 Estrogen receptor negative status [ER-]: Secondary | ICD-10-CM

## 2015-01-02 DIAGNOSIS — R530 Neoplastic (malignant) related fatigue: Secondary | ICD-10-CM

## 2015-01-02 DIAGNOSIS — D6481 Anemia due to antineoplastic chemotherapy: Secondary | ICD-10-CM

## 2015-01-02 DIAGNOSIS — L608 Other nail disorders: Secondary | ICD-10-CM

## 2015-01-02 DIAGNOSIS — C773 Secondary and unspecified malignant neoplasm of axilla and upper limb lymph nodes: Secondary | ICD-10-CM

## 2015-01-02 DIAGNOSIS — K5900 Constipation, unspecified: Secondary | ICD-10-CM

## 2015-01-02 DIAGNOSIS — R110 Nausea: Secondary | ICD-10-CM | POA: Diagnosis not present

## 2015-01-02 LAB — COMPREHENSIVE METABOLIC PANEL (CC13)
ALT: 13 U/L (ref 0–55)
AST: 16 U/L (ref 5–34)
Albumin: 3.7 g/dL (ref 3.5–5.0)
Alkaline Phosphatase: 70 U/L (ref 40–150)
Anion Gap: 8 mEq/L (ref 3–11)
BUN: 7.7 mg/dL (ref 7.0–26.0)
CHLORIDE: 106 meq/L (ref 98–109)
CO2: 26 meq/L (ref 22–29)
Calcium: 9.5 mg/dL (ref 8.4–10.4)
Creatinine: 0.7 mg/dL (ref 0.6–1.1)
EGFR: >90 mL/min/{1.73_m2} (ref >90)
GLUCOSE: 111 mg/dL (ref 70–140)
POTASSIUM: 3.9 meq/L (ref 3.5–5.1)
SODIUM: 140 meq/L (ref 136–145)
Total Bilirubin: 0.45 mg/dL (ref 0.20–1.20)
Total Protein: 7.2 g/dL (ref 6.4–8.3)

## 2015-01-02 LAB — CBC WITH DIFFERENTIAL/PLATELET
BASO%: 0.2 % (ref 0.0–2.0)
BASOS ABS: 0 10*3/uL (ref 0.0–0.1)
EOS%: 0.1 % (ref 0.0–7.0)
Eosinophils Absolute: 0 10*3/uL (ref 0.0–0.5)
HCT: 25.2 % — ABNORMAL LOW (ref 34.8–46.6)
HEMOGLOBIN: 8.6 g/dL — AB (ref 11.6–15.9)
LYMPH%: 29.1 % (ref 14.0–49.7)
MCH: 31.6 pg (ref 25.1–34.0)
MCHC: 34.1 g/dL (ref 31.5–36.0)
MCV: 92.9 fL (ref 79.5–101.0)
MONO#: 0.3 10*3/uL (ref 0.1–0.9)
MONO%: 7.7 % (ref 0.0–14.0)
NEUT#: 2.5 10*3/uL (ref 1.5–6.5)
NEUT%: 62.9 % (ref 38.4–76.8)
Platelets: 103 10*3/uL — ABNORMAL LOW (ref 145–400)
RBC: 2.72 10*6/uL — ABNORMAL LOW (ref 3.70–5.45)
RDW: 19.9 % — AB (ref 11.2–14.5)
WBC: 4 10*3/uL (ref 3.9–10.3)
lymph#: 1.2 10*3/uL (ref 0.9–3.3)

## 2015-01-02 MED ORDER — HEPARIN SOD (PORK) LOCK FLUSH 100 UNIT/ML IV SOLN
500.0000 [IU] | Freq: Once | INTRAVENOUS | Status: AC | PRN
  Administered 2015-01-02: 500 [IU]
  Filled 2015-01-02: qty 5

## 2015-01-02 MED ORDER — SODIUM CHLORIDE 0.9 % IV SOLN
Freq: Once | INTRAVENOUS | Status: AC
  Administered 2015-01-02: 11:00:00 via INTRAVENOUS
  Filled 2015-01-02: qty 5

## 2015-01-02 MED ORDER — SODIUM CHLORIDE 0.9 % IV SOLN
Freq: Once | INTRAVENOUS | Status: AC
  Administered 2015-01-02: 10:00:00 via INTRAVENOUS

## 2015-01-02 MED ORDER — PALONOSETRON HCL INJECTION 0.25 MG/5ML
INTRAVENOUS | Status: AC
  Filled 2015-01-02: qty 5

## 2015-01-02 MED ORDER — SODIUM CHLORIDE 0.9 % IJ SOLN
10.0000 mL | INTRAMUSCULAR | Status: DC | PRN
  Administered 2015-01-02: 10 mL
  Filled 2015-01-02: qty 10

## 2015-01-02 MED ORDER — PACLITAXEL PROTEIN-BOUND CHEMO INJECTION 100 MG
55.0000 mg/m2 | Freq: Once | INTRAVENOUS | Status: AC
  Administered 2015-01-02: 100 mg via INTRAVENOUS
  Filled 2015-01-02: qty 20

## 2015-01-02 MED ORDER — SODIUM CHLORIDE 0.9 % IJ SOLN
10.0000 mL | Freq: Once | INTRAMUSCULAR | Status: DC
Start: 2015-01-02 — End: 2015-01-02
  Administered 2015-01-02: 10 mL
  Filled 2015-01-02: qty 10

## 2015-01-02 MED ORDER — HEPARIN SOD (PORK) LOCK FLUSH 100 UNIT/ML IV SOLN
500.0000 [IU] | Freq: Once | INTRAVENOUS | Status: DC
  Filled 2015-01-02: qty 5

## 2015-01-02 MED ORDER — PALONOSETRON HCL INJECTION 0.25 MG/5ML
0.2500 mg | Freq: Once | INTRAVENOUS | Status: AC
  Administered 2015-01-02: 0.25 mg via INTRAVENOUS

## 2015-01-02 MED ORDER — SODIUM CHLORIDE 0.9 % IV SOLN
208.3500 mg | Freq: Once | INTRAVENOUS | Status: AC
  Administered 2015-01-02: 210 mg via INTRAVENOUS
  Filled 2015-01-02: qty 21

## 2015-01-02 NOTE — Telephone Encounter (Signed)
Appointments made and avs printed for patient °

## 2015-01-02 NOTE — Patient Instructions (Signed)
[facility] Discharge Instructions for Patients Receiving Chemotherapy  Today you received the following chemotherapy agents: Abraxane and Carboplatin  To help prevent nausea and vomiting after your treatment, we encourage you to take your nausea medication: Compazine 10 mg every 6 hours as needed.   If you develop nausea and vomiting that is not controlled by your nausea medication, call the clinic.   BELOW ARE SYMPTOMS THAT SHOULD BE REPORTED IMMEDIATELY:  *FEVER GREATER THAN 100.5 F  *CHILLS WITH OR WITHOUT FEVER  NAUSEA AND VOMITING THAT IS NOT CONTROLLED WITH YOUR NAUSEA MEDICATION  *UNUSUAL SHORTNESS OF BREATH  *UNUSUAL BRUISING OR BLEEDING  TENDERNESS IN MOUTH AND THROAT WITH OR WITHOUT PRESENCE OF ULCERS  *URINARY PROBLEMS  *BOWEL PROBLEMS  UNUSUAL RASH Items with * indicate a potential emergency and should be followed up as soon as possible.  Feel free to call the clinic you have any questions or concerns. The clinic phone number is [phone].  Please show the CHEMO ALERT CARD at check-in to the Emergency Department and triage nurse.   

## 2015-01-02 NOTE — Progress Notes (Signed)
Patient Care Team: Elisabeth Cara, PA-C as PCP - General (Family Medicine) [name], MD as Consulting Physician (General Surgery) Nicholas Lose, MD as Consulting Physician (Hematology and Oncology) Eppie Gibson, MD as Attending Physician (Radiation Oncology) Rockwell Germany, RN as Registered Nurse Mauro Kaufmann, RN as Registered Nurse  DIAGNOSIS: Breast cancer of lower-inner quadrant of right female breast [facility])   Staging form: Breast, AJCC 7th Edition     Clinical stage from 09/07/2014: Stage IIIC (T2, N3, M0) - Unsigned       Staging comments: Staged at breast conference on 6.29.16    SUMMARY OF ONCOLOGIC HISTORY:   Breast cancer of lower-inner quadrant of right female breast ([place])   08/29/2014 Mammogram Right breast mass 5 x 4.8 x 4.2 cm, 6 enlarged axillary lymph nodes including nodes in level III location, T2 N3 M0 stage IIIc clinical stage   08/31/2014 Initial Diagnosis Right breast biopsy 6:00: Invasive ductal carcinoma, 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 Today is cycle 8 of carboplatin and Abraxane. She does not have any neuropathy. However she has significant anticipatory nausea and vomiting. She reports that she smells something in IV and then she started to have nausea. She does not have any nausea after she gets home. She is moderately fatigued. Denies any shortness of breath to exertion. Denies any abdominal pain diarrhea constipation.  REVIEW OF SYSTEMS:   Constitutional: Denies fevers, chills or abnormal weight loss Eyes: Denies blurriness of vision Ears, nose, mouth, throat,  and face: Denies mucositis or sore throat Respiratory: Denies cough, dyspnea or wheezes Cardiovascular: Denies palpitation, chest discomfort or lower extremity swelling Gastrointestinal:  Anticipatory nausea Skin: Denies abnormal skin rashes Lymphatics: Denies new lymphadenopathy or easy bruising Neurological:Denies numbness, tingling or new weaknesses Behavioral/Psych: Mood is stable, no new changes   All other systems were reviewed with the patient and are negative.  I have reviewed the past medical history, past surgical history, social history and family history with the patient and they are unchanged from previous note.  ALLERGIES:  has No Known Allergies.  MEDICATIONS:  Current Outpatient Prescriptions  Medication Sig Dispense Refill  . dexamethasone (DECADRON) 4 MG tablet     . HYDROcodone-acetaminophen (NORCO/VICODIN) 5-325 MG per tablet Take 1-2 tablets by mouth every 6 (six) hours as needed. 30 tablet 0  . lidocaine-prilocaine (EMLA) cream Apply to affected area once 30 g 3  . LORazepam (ATIVAN) 0.5 MG tablet Take 1 tablet (0.5 mg total) by mouth at bedtime. 30 tablet 0  . naproxen sodium (RA NAPROXEN SODIUM) 220 MG tablet Take 440 mg by mouth.    . ondansetron (ZOFRAN) 8 MG tablet Take 1 tablet (8 mg total) by mouth 2 (two) times daily as needed. Start on the third day after chemotherapy. 30 tablet 1  . prochlorperazine (COMPAZINE) 10 MG tablet Take 1 tablet (10 mg total) by mouth every 6 (six) hours as needed (Nausea or vomiting). 30 tablet 1   No current facility-administered medications for this visit.    PHYSICAL EXAMINATION: ECOG PERFORMANCE STATUS: 1 - Symptomatic but completely ambulatory  Filed Vitals:   01/02/15 0904  BP:  122/71  Pulse: 101  Temp: 98.4 F (36.9 C)  Resp: 18   Filed Weights   01/02/15 0904  Weight: 171 lb 3.2 oz (77.656 kg)    GENERAL:alert, no distress and comfortable SKIN: skin color, texture, turgor are normal, no rashes or  significant lesions EYES: normal, Conjunctiva are pink and non-injected, sclera clear OROPHARYNX:no exudate, no erythema and lips, buccal mucosa, and tongue normal  NECK: supple, thyroid normal size, non-tender, without nodularity LYMPH:  no palpable lymphadenopathy in the cervical, axillary or inguinal LUNGS: clear to auscultation and percussion with normal breathing effort HEART: regular rate & rhythm and no murmurs and no lower extremity edema ABDOMEN:abdomen soft, non-tender and normal bowel sounds Musculoskeletal:no cyanosis of digits and no clubbing  NEURO: alert & oriented x 3 with fluent speech, no focal motor/sensory deficits  LABORATORY DATA:  I have reviewed the data as listed   Chemistry      Component Value Date/Time   NA 140 01/02/2015 0823   K 3.9 01/02/2015 0823   CO2 26 01/02/2015 0823   BUN 7.7 01/02/2015 0823   CREATININE 0.7 01/02/2015 0823      Component Value Date/Time   CALCIUM 9.5 01/02/2015 0823   ALKPHOS 70 01/02/2015 0823   AST 16 01/02/2015 0823   ALT 13 01/02/2015 0823   BILITOT 0.45 01/02/2015 0823       Lab Results  Component Value Date   WBC 4.0 01/02/2015   HGB 8.6* 01/02/2015   HCT 25.2* 01/02/2015   MCV 92.9 01/02/2015   PLT 103* 01/02/2015   NEUTROABS 2.5 01/02/2015   ASSESSMENT & PLAN:  Breast cancer of lower-inner quadrant of right female breast Right breast mass 5 x 4.8 x 4.2 cm, 6 enlarged axillary lymph nodes including nodes in level III location, T2 N3 M0 stage IIIc clinical stage Right breast biopsy 6:00: Invasive ductal carcinoma, 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 Neo-adjuvant chemotherapy with dose dense Adriamycin and Cytoxan 4 followed by weekly Abraxane and carboplatin 12 2. Followed by surgery 3. Followed by radiation therapy  Current Treatment: completed 4 cycles of adriamycin and Cytoxan, today is cycle 8/12  Abraxane and carboplatin Chemo Toxicities: 1. Constipation: Improved with Miralax 2. alopecia 3. Grade 3 neutropenia nadir counts check ANC 0.2, decreased dose of chemotherapy for cycle 2 Adriamycin and Cytoxan 4. Anemia due to chemotherapy: monitoring hemoglobin asymptomatic 5. Skin and nail discoloration: Due to chemotherapy, especially on the hands 6. Severe fatigue: Unfortunately this is due to chemotherapy and not much can be done to help improve her fatigue. I recommended that she increase her protein intake in the diet. 7. Thrombocytopenia: I reduced the dosage of chemotherapy with cycle 8 8. Anticipatory nausea: I added Emend to her regimen with cycle 8  Monitoring closely for toxicities RTC in 2 weeks for cycle 10/12 Abraxane carboplatin  No orders of the defined types were placed in this encounter.   The patient has a good understanding of the overall plan. she agrees with it. she will call with any problems that may develop before the next visit here.   Rulon Eisenmenger, MD 01/02/2015

## 2015-01-09 ENCOUNTER — Ambulatory Visit (HOSPITAL_BASED_OUTPATIENT_CLINIC_OR_DEPARTMENT_OTHER): Payer: 59

## 2015-01-09 ENCOUNTER — Other Ambulatory Visit: Payer: 59

## 2015-01-09 ENCOUNTER — Ambulatory Visit: Payer: 59

## 2015-01-09 ENCOUNTER — Other Ambulatory Visit (HOSPITAL_BASED_OUTPATIENT_CLINIC_OR_DEPARTMENT_OTHER): Payer: 59

## 2015-01-09 DIAGNOSIS — C50311 Malignant neoplasm of lower-inner quadrant of right female breast: Secondary | ICD-10-CM

## 2015-01-09 DIAGNOSIS — Z95828 Presence of other vascular implants and grafts: Secondary | ICD-10-CM

## 2015-01-09 LAB — COMPREHENSIVE METABOLIC PANEL (CC13)
ALK PHOS: 67 U/L (ref 40–150)
ALT: 12 U/L (ref 0–55)
ANION GAP: 9 meq/L (ref 3–11)
AST: 15 U/L (ref 5–34)
Albumin: 3.6 g/dL (ref 3.5–5.0)
BILIRUBIN TOTAL: 0.3 mg/dL (ref 0.20–1.20)
BUN: 7.7 mg/dL (ref 7.0–26.0)
CALCIUM: 9.6 mg/dL (ref 8.4–10.4)
CO2: 23 meq/L (ref 22–29)
Chloride: 109 mEq/L (ref 98–109)
Creatinine: 0.7 mg/dL (ref 0.6–1.1)
Glucose: 135 mg/dl (ref 70–140)
Potassium: 3.5 mEq/L (ref 3.5–5.1)
Sodium: 141 mEq/L (ref 136–145)
TOTAL PROTEIN: 7.1 g/dL (ref 6.4–8.3)

## 2015-01-09 LAB — CBC WITH DIFFERENTIAL/PLATELET
BASO%: 0 % (ref 0.0–2.0)
Basophils Absolute: 0 10*3/uL (ref 0.0–0.1)
EOS ABS: 0 10*3/uL (ref 0.0–0.5)
EOS%: 0.4 % (ref 0.0–7.0)
HEMATOCRIT: 23.9 % — AB (ref 34.8–46.6)
HGB: 8.1 g/dL — ABNORMAL LOW (ref 11.6–15.9)
LYMPH%: 46.6 % (ref 14.0–49.7)
MCH: 31.9 pg (ref 25.1–34.0)
MCHC: 33.9 g/dL (ref 31.5–36.0)
MCV: 94.1 fL (ref 79.5–101.0)
MONO#: 0.2 10*3/uL (ref 0.1–0.9)
MONO%: 7.1 % (ref 0.0–14.0)
NEUT%: 45.9 % (ref 38.4–76.8)
NEUTROS ABS: 1.2 10*3/uL — AB (ref 1.5–6.5)
PLATELETS: 64 10*3/uL — AB (ref 145–400)
RBC: 2.54 10*6/uL — AB (ref 3.70–5.45)
RDW: 17.3 % — ABNORMAL HIGH (ref 11.2–14.5)
WBC: 2.7 10*3/uL — AB (ref 3.9–10.3)
lymph#: 1.3 10*3/uL (ref 0.9–3.3)

## 2015-01-09 MED ORDER — SODIUM CHLORIDE 0.9 % IJ SOLN
10.0000 mL | INTRAMUSCULAR | Status: DC | PRN
  Administered 2015-01-09: 10 mL via INTRAVENOUS
  Filled 2015-01-09: qty 10

## 2015-01-09 MED ORDER — HEPARIN SOD (PORK) LOCK FLUSH 100 UNIT/ML IV SOLN
500.0000 [IU] | Freq: Once | INTRAVENOUS | Status: AC
  Administered 2015-01-09: 500 [IU] via INTRAVENOUS
  Filled 2015-01-09: qty 5

## 2015-01-09 NOTE — Progress Notes (Signed)
ANC 1.2 Platelets 67. Per Dr. Lindi Adie, hold treatment today. Pt denies fevers, nausea, and pain. Neutropenic instructions given and port deaccessed.

## 2015-01-15 NOTE — Assessment & Plan Note (Signed)
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 Neo-adjuvant chemotherapy with dose dense Adriamycin and Cytoxan 4 followed by weekly Abraxane and carboplatin 12 2. Followed by surgery 3. Followed by radiation therapy  Current Treatment: completed 4 cycles of adriamycin and Cytoxan, today is cycle 10/12 Abraxane and carboplatin Chemo Toxicities: 1. Constipation: Improved with Miralax 2. alopecia 3. Grade 3 neutropenia nadir counts check ANC 0.2, decreased dose of chemotherapy for cycle 2 Adriamycin and Cytoxan 4. Anemia due to chemotherapy: monitoring hemoglobin asymptomatic 5. Skin and nail discoloration: Due to chemotherapy, especially on the hands 6. Severe fatigue: Unfortunately this is due to chemotherapy and not much can be done to help improve her fatigue. I recommended that she increase her protein intake in the diet. 7. Thrombocytopenia: I reduced the dosage of chemotherapy with cycle 8 8. Anticipatory nausea: I added Emend to her regimen with cycle 8  Monitoring closely for toxicities RTC in 2 weeks for cycle 12/12 Abraxane carboplatin

## 2015-01-16 ENCOUNTER — Other Ambulatory Visit (HOSPITAL_BASED_OUTPATIENT_CLINIC_OR_DEPARTMENT_OTHER): Payer: 59

## 2015-01-16 ENCOUNTER — Ambulatory Visit (HOSPITAL_BASED_OUTPATIENT_CLINIC_OR_DEPARTMENT_OTHER): Payer: 59 | Admitting: Hematology and Oncology

## 2015-01-16 ENCOUNTER — Encounter: Payer: Self-pay | Admitting: Hematology and Oncology

## 2015-01-16 ENCOUNTER — Other Ambulatory Visit: Payer: 59

## 2015-01-16 ENCOUNTER — Telehealth: Payer: Self-pay | Admitting: Hematology and Oncology

## 2015-01-16 ENCOUNTER — Ambulatory Visit (HOSPITAL_BASED_OUTPATIENT_CLINIC_OR_DEPARTMENT_OTHER): Payer: 59

## 2015-01-16 ENCOUNTER — Other Ambulatory Visit: Payer: Self-pay | Admitting: *Deleted

## 2015-01-16 VITALS — BP 129/81 | HR 99 | Temp 98.3°F | Resp 20 | Ht 67.0 in | Wt 171.7 lb

## 2015-01-16 DIAGNOSIS — D696 Thrombocytopenia, unspecified: Secondary | ICD-10-CM

## 2015-01-16 DIAGNOSIS — C773 Secondary and unspecified malignant neoplasm of axilla and upper limb lymph nodes: Secondary | ICD-10-CM

## 2015-01-16 DIAGNOSIS — D701 Agranulocytosis secondary to cancer chemotherapy: Secondary | ICD-10-CM | POA: Diagnosis not present

## 2015-01-16 DIAGNOSIS — Z170 Estrogen receptor positive status [ER+]: Secondary | ICD-10-CM

## 2015-01-16 DIAGNOSIS — C50311 Malignant neoplasm of lower-inner quadrant of right female breast: Secondary | ICD-10-CM | POA: Diagnosis not present

## 2015-01-16 DIAGNOSIS — K5900 Constipation, unspecified: Secondary | ICD-10-CM

## 2015-01-16 DIAGNOSIS — R530 Neoplastic (malignant) related fatigue: Secondary | ICD-10-CM

## 2015-01-16 DIAGNOSIS — L608 Other nail disorders: Secondary | ICD-10-CM

## 2015-01-16 DIAGNOSIS — G620 Drug-induced polyneuropathy: Secondary | ICD-10-CM

## 2015-01-16 DIAGNOSIS — D6481 Anemia due to antineoplastic chemotherapy: Secondary | ICD-10-CM

## 2015-01-16 LAB — COMPREHENSIVE METABOLIC PANEL (CC13)
ALBUMIN: 3.4 g/dL — AB (ref 3.5–5.0)
ALK PHOS: 68 U/L (ref 40–150)
ALT: 12 U/L (ref 0–55)
ANION GAP: 11 meq/L (ref 3–11)
AST: 15 U/L (ref 5–34)
BILIRUBIN TOTAL: 0.37 mg/dL (ref 0.20–1.20)
BUN: 6.7 mg/dL — ABNORMAL LOW (ref 7.0–26.0)
CALCIUM: 9.3 mg/dL (ref 8.4–10.4)
CO2: 23 mEq/L (ref 22–29)
CREATININE: 0.7 mg/dL (ref 0.6–1.1)
Chloride: 108 mEq/L (ref 98–109)
Glucose: 120 mg/dl (ref 70–140)
Potassium: 3.6 mEq/L (ref 3.5–5.1)
Sodium: 141 mEq/L (ref 136–145)
TOTAL PROTEIN: 6.9 g/dL (ref 6.4–8.3)

## 2015-01-16 LAB — CBC WITH DIFFERENTIAL/PLATELET
BASO%: 0.3 % (ref 0.0–2.0)
BASOS ABS: 0 10*3/uL (ref 0.0–0.1)
EOS ABS: 0 10*3/uL (ref 0.0–0.5)
EOS%: 0.4 % (ref 0.0–7.0)
HCT: 24.2 % — ABNORMAL LOW (ref 34.8–46.6)
HGB: 8.2 g/dL — ABNORMAL LOW (ref 11.6–15.9)
LYMPH%: 41.4 % (ref 14.0–49.7)
MCH: 33.1 pg (ref 25.1–34.0)
MCHC: 33.9 g/dL (ref 31.5–36.0)
MCV: 97.5 fL (ref 79.5–101.0)
MONO#: 0.3 10*3/uL (ref 0.1–0.9)
MONO%: 12.2 % (ref 0.0–14.0)
NEUT#: 1.1 10*3/uL — ABNORMAL LOW (ref 1.5–6.5)
NEUT%: 45.7 % (ref 38.4–76.8)
Platelets: 78 10*3/uL — ABNORMAL LOW (ref 145–400)
RBC: 2.48 10*6/uL — AB (ref 3.70–5.45)
RDW: 20.9 % — AB (ref 11.2–14.5)
WBC: 2.4 10*3/uL — AB (ref 3.9–10.3)
lymph#: 1 10*3/uL (ref 0.9–3.3)

## 2015-01-16 MED ORDER — TBO-FILGRASTIM 480 MCG/0.8ML ~~LOC~~ SOSY
480.0000 ug | PREFILLED_SYRINGE | Freq: Once | SUBCUTANEOUS | Status: AC
  Administered 2015-01-16: 480 ug via SUBCUTANEOUS
  Filled 2015-01-16: qty 0.8

## 2015-01-16 MED ORDER — FILGRASTIM 480 MCG/0.8ML IJ SOSY: 480.0000 ug | PREFILLED_SYRINGE | Freq: Once | INTRAMUSCULAR | Status: DC

## 2015-01-16 MED ORDER — SODIUM CHLORIDE 0.9 % IJ SOLN
10.0000 mL | Freq: Once | INTRAMUSCULAR | Status: AC
  Administered 2015-01-16: 10 mL
  Filled 2015-01-16: qty 10

## 2015-01-16 NOTE — Progress Notes (Signed)
Pt came by to get gas card. Pt also states she had not heard back from [employer]. Gave pt number to them to check on status of application. Also gave pt application for [employer] [facility] for additional financial assistance.

## 2015-01-16 NOTE — Telephone Encounter (Signed)
Appointments made and avs printed for patient °

## 2015-01-16 NOTE — Progress Notes (Signed)
Patient Care Team: Elisabeth Cara, PA-C as PCP - General (Family Medicine) [name], MD as Consulting Physician (General Surgery) Nicholas Lose, MD as Consulting Physician (Hematology and Oncology) Eppie Gibson, MD as Attending Physician (Radiation Oncology) Rockwell Germany, RN as Registered Nurse Mauro Kaufmann, RN as Registered Nurse  DIAGNOSIS: Breast cancer of lower-inner quadrant of right female breast [facility]   Staging form: Breast, AJCC 7th Edition     Clinical stage from 09/07/2014: Stage IIIC (T2, N3, M0) - Unsigned       Staging comments: Staged at breast conference on 6.29.16  SUMMARY OF ONCOLOGIC HISTORY:   Breast cancer of lower-inner quadrant of right female breast ([place])   08/29/2014 Mammogram Right breast mass 5 x 4.8 x 4.2 cm, 6 enlarged axillary lymph nodes including nodes in level III location, T2 N3 M0 stage IIIc clinical stage   08/31/2014 Initial Diagnosis Right breast biopsy 6:00: Invasive ductal carcinoma, right axillary lymph node biopsy high-grade carcinoma ER 0% PR 0% HER-2 negative ratio 1.68, Ki-67 80%, grade 3   09/19/2014 -  Neo-Adjuvant Chemotherapy Adriamycin Cytoxan dose dense 4 followed by Abraxane and carboplatin weekly 12    CHIEF COMPLIANT: patient is here for cycle 9 of chemotherapy  INTERVAL HISTORY: Brandy Douglas is a 44 year old with above-mentioned history of right breast cancer triple negative disease who is currently on neoadjuvant chemotherapy. She reports that the nausea was resolved since we added Emend. 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 Does have moderate to severe fatigue.  REVIEW OF SYSTEMS:   Constitutional: Denies fevers, chills or abnormal weight loss, complaints of fatigue Eyes: Denies blurriness of vision Ears, nose, mouth, throat, and face: Denies mucositis or sore throat Respiratory: Denies cough, dyspnea or wheezes Cardiovascular: Denies palpitation, chest discomfort or  lower extremity swelling Gastrointestinal:  Denies nausea, heartburn or change in bowel habits Skin: Denies abnormal skin rashes Lymphatics: Denies new lymphadenopathy or easy bruising Neurological:mild numbness at that time on the feet Behavioral/Psych: Mood is stable, no new changes   All other systems were reviewed with the patient and are negative.  I have reviewed the past medical history, past surgical history, social history and family history with the patient and they are unchanged from previous note.  ALLERGIES:  has No Known Allergies.  MEDICATIONS:  Current Outpatient Prescriptions  Medication Sig Dispense Refill  . dexamethasone (DECADRON) 4 MG tablet     . HYDROcodone-acetaminophen (NORCO/VICODIN) 5-325 MG per tablet Take 1-2 tablets by mouth every 6 (six) hours as needed. 30 tablet 0  . lidocaine-prilocaine (EMLA) cream Apply to affected area once 30 g 3  . LORazepam (ATIVAN) 0.5 MG tablet Take 1 tablet (0.5 mg total) by mouth at bedtime. 30 tablet 0  . naproxen sodium (RA NAPROXEN SODIUM) 220 MG tablet Take 440 mg by mouth.    . ondansetron (ZOFRAN) 8 MG tablet Take 1 tablet (8 mg total) by mouth 2 (two) times daily as needed. Start on the third day after chemotherapy. 30 tablet 1  . prochlorperazine (COMPAZINE) 10 MG tablet Take 1 tablet (10 mg total) by mouth every 6 (six) hours as needed (Nausea or vomiting). 30 tablet 1   No current facility-administered medications for this visit.    PHYSICAL EXAMINATION: ECOG PERFORMANCE STATUS: 1 - Symptomatic but completely ambulatory  Filed Vitals:   01/16/15 0858  BP: 129/81  Pulse: 99  Temp: 98.3 F (36.8 C)  Resp: 20   Filed  Weights   01/16/15 0858  Weight: 171 lb 11.2 oz (77.883 kg)    GENERAL:alert, no distress and comfortable SKIN: skin color, texture, turgor are normal, no rashes or significant lesions EYES: normal, Conjunctiva are pink and non-injected, sclera clear OROPHARYNX:no exudate, no erythema and  lips, buccal mucosa, and tongue normal  NECK: supple, thyroid normal size, non-tender, without nodularity LYMPH:  no palpable lymphadenopathy in the cervical, axillary or inguinal LUNGS: clear to auscultation and percussion with normal breathing effort HEART: regular rate & rhythm and no murmurs and no lower extremity edema ABDOMEN:abdomen soft, non-tender and normal bowel sounds Musculoskeletal:no cyanosis of digits and no clubbing  NEURO: alert & oriented x 3 with fluent speech, no focal motor/sensory deficits  LABORATORY DATA:  I have reviewed the data as listed   Chemistry      Component Value Date/Time   NA 141 01/09/2015 0901   K 3.5 01/09/2015 0901   CO2 23 01/09/2015 0901   BUN 7.7 01/09/2015 0901   CREATININE 0.7 01/09/2015 0901      Component Value Date/Time   CALCIUM 9.6 01/09/2015 0901   ALKPHOS 67 01/09/2015 0901   AST 15 01/09/2015 0901   ALT 12 01/09/2015 0901   BILITOT 0.30 01/09/2015 0901       Lab Results  Component Value Date   WBC 2.4* 01/16/2015   HGB 8.2* 01/16/2015   HCT 24.2* 01/16/2015   MCV 97.5 01/16/2015   PLT 78* 01/16/2015   NEUTROABS 1.1* 01/16/2015   ASSESSMENT & PLAN:  Breast cancer of lower-inner quadrant of right female breast Right breast mass 5 x 4.8 x 4.2 cm, 6 enlarged axillary lymph nodes including nodes in level III location, T2 N3 M0 stage IIIc clinical stage Right breast biopsy 6:00: Invasive ductal carcinoma, 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 Neo-adjuvant chemotherapy with dose dense Adriamycin and Cytoxan 4 followed by weekly Abraxane and carboplatin 12 2. Followed by surgery 3. Followed by radiation therapy  Current Treatment: completed 4 cycles of adriamycin and Cytoxan, today was supposed to be cycle 9/12 Abraxane and carboplatin (being held for neutropenia and thrombocytopenia)  Chemo Toxicities: 1. Constipation:  Improved with Miralax 2. alopecia 3. Grade 3 neutropenia  with cycle 9 of Abraxane and carboplatin. We will give Neupogen injections on 11/ 7, 11/ 8,11/11 and reassess her to plan for Abraxane alone on 01/23/2015 we will also DC carboplatin. 4. Anemia due to chemotherapy: monitoring hemoglobin asymptomatic, hemoglobin continues to remain low 5. Skin and nail discoloration: Due to chemotherapy, especially on the hands 6. Severe fatigue: Unfortunately this is due to chemotherapy and not much can be done to help improve her fatigue. I recommended that she increase her protein intake in the diet. 7. Thrombocytopenia: I reduced the dosage of chemotherapy with cycle 8 in spite of this her platelet counts did not improve. I will discontinue carboplatin from cycle 9. 8. Anticipatory nausea: I added Emend to her regimen with cycle 8, no further episodes of nausea 9. Grade 1 neuropathy Monitoring closely for toxicities Return to clinic in 1 week for cycle 9 of Abraxane alone.  No orders of the defined types were placed in this encounter.   The patient has a good understanding of the overall plan. she agrees with it. she will call with any problems that may develop before the next visit here.   Rulon Eisenmenger, MD 01/16/2015

## 2015-01-17 ENCOUNTER — Ambulatory Visit (HOSPITAL_BASED_OUTPATIENT_CLINIC_OR_DEPARTMENT_OTHER): Payer: 59

## 2015-01-17 VITALS — BP 126/80 | HR 105 | Temp 98.5°F

## 2015-01-17 DIAGNOSIS — C50311 Malignant neoplasm of lower-inner quadrant of right female breast: Secondary | ICD-10-CM

## 2015-01-17 DIAGNOSIS — Z5189 Encounter for other specified aftercare: Secondary | ICD-10-CM | POA: Diagnosis not present

## 2015-01-17 MED ORDER — TBO-FILGRASTIM 480 MCG/0.8ML ~~LOC~~ SOSY
480.0000 ug | PREFILLED_SYRINGE | Freq: Once | SUBCUTANEOUS | Status: AC
  Administered 2015-01-17: 480 ug via SUBCUTANEOUS
  Filled 2015-01-17: qty 0.8

## 2015-01-20 ENCOUNTER — Ambulatory Visit (HOSPITAL_BASED_OUTPATIENT_CLINIC_OR_DEPARTMENT_OTHER): Payer: 59

## 2015-01-20 VITALS — BP 124/84 | HR 110 | Temp 98.8°F

## 2015-01-20 DIAGNOSIS — C50311 Malignant neoplasm of lower-inner quadrant of right female breast: Secondary | ICD-10-CM | POA: Diagnosis not present

## 2015-01-20 DIAGNOSIS — Z5189 Encounter for other specified aftercare: Secondary | ICD-10-CM

## 2015-01-20 MED ORDER — TBO-FILGRASTIM 480 MCG/0.8ML ~~LOC~~ SOSY
480.0000 ug | PREFILLED_SYRINGE | Freq: Once | SUBCUTANEOUS | Status: AC
  Administered 2015-01-20: 480 ug via SUBCUTANEOUS
  Filled 2015-01-20: qty 0.8

## 2015-01-23 ENCOUNTER — Ambulatory Visit: Payer: 59

## 2015-01-23 ENCOUNTER — Other Ambulatory Visit: Payer: 59

## 2015-01-23 ENCOUNTER — Encounter: Payer: Self-pay | Admitting: Hematology and Oncology

## 2015-01-23 ENCOUNTER — Telehealth: Payer: Self-pay | Admitting: Hematology and Oncology

## 2015-01-23 ENCOUNTER — Other Ambulatory Visit (HOSPITAL_BASED_OUTPATIENT_CLINIC_OR_DEPARTMENT_OTHER): Payer: 59

## 2015-01-23 ENCOUNTER — Ambulatory Visit (HOSPITAL_BASED_OUTPATIENT_CLINIC_OR_DEPARTMENT_OTHER): Payer: 59

## 2015-01-23 ENCOUNTER — Ambulatory Visit (HOSPITAL_BASED_OUTPATIENT_CLINIC_OR_DEPARTMENT_OTHER): Payer: 59 | Admitting: Hematology and Oncology

## 2015-01-23 VITALS — BP 130/79 | HR 115 | Temp 98.3°F | Resp 19 | Ht 67.0 in | Wt 173.2 lb

## 2015-01-23 DIAGNOSIS — Z171 Estrogen receptor negative status [ER-]: Secondary | ICD-10-CM | POA: Diagnosis not present

## 2015-01-23 DIAGNOSIS — C50311 Malignant neoplasm of lower-inner quadrant of right female breast: Secondary | ICD-10-CM

## 2015-01-23 DIAGNOSIS — L608 Other nail disorders: Secondary | ICD-10-CM

## 2015-01-23 DIAGNOSIS — C773 Secondary and unspecified malignant neoplasm of axilla and upper limb lymph nodes: Secondary | ICD-10-CM | POA: Diagnosis not present

## 2015-01-23 DIAGNOSIS — Z95828 Presence of other vascular implants and grafts: Secondary | ICD-10-CM

## 2015-01-23 DIAGNOSIS — D6481 Anemia due to antineoplastic chemotherapy: Secondary | ICD-10-CM

## 2015-01-23 DIAGNOSIS — R530 Neoplastic (malignant) related fatigue: Secondary | ICD-10-CM

## 2015-01-23 DIAGNOSIS — D701 Agranulocytosis secondary to cancer chemotherapy: Secondary | ICD-10-CM | POA: Diagnosis not present

## 2015-01-23 DIAGNOSIS — G620 Drug-induced polyneuropathy: Secondary | ICD-10-CM

## 2015-01-23 DIAGNOSIS — Z5111 Encounter for antineoplastic chemotherapy: Secondary | ICD-10-CM | POA: Diagnosis not present

## 2015-01-23 DIAGNOSIS — K5900 Constipation, unspecified: Secondary | ICD-10-CM

## 2015-01-23 DIAGNOSIS — D696 Thrombocytopenia, unspecified: Secondary | ICD-10-CM

## 2015-01-23 LAB — CBC WITH DIFFERENTIAL/PLATELET
BASO%: 0.3 % (ref 0.0–2.0)
Basophils Absolute: 0 10*3/uL (ref 0.0–0.1)
EOS%: 0.2 % (ref 0.0–7.0)
Eosinophils Absolute: 0 10*3/uL (ref 0.0–0.5)
HCT: 27.2 % — ABNORMAL LOW (ref 34.8–46.6)
HGB: 9 g/dL — ABNORMAL LOW (ref 11.6–15.9)
LYMPH%: 30 % (ref 14.0–49.7)
MCH: 33.3 pg (ref 25.1–34.0)
MCHC: 33.2 g/dL (ref 31.5–36.0)
MCV: 100.2 fL (ref 79.5–101.0)
MONO#: 0.5 10*3/uL (ref 0.1–0.9)
MONO%: 11.1 % (ref 0.0–14.0)
NEUT%: 58.4 % (ref 38.4–76.8)
NEUTROS ABS: 2.7 10*3/uL (ref 1.5–6.5)
Platelets: 115 10*3/uL — ABNORMAL LOW (ref 145–400)
RBC: 2.72 10*6/uL — AB (ref 3.70–5.45)
RDW: 21.7 % — ABNORMAL HIGH (ref 11.2–14.5)
WBC: 4.7 10*3/uL (ref 3.9–10.3)
lymph#: 1.4 10*3/uL (ref 0.9–3.3)

## 2015-01-23 LAB — COMPREHENSIVE METABOLIC PANEL (CC13)
ALBUMIN: 3.6 g/dL (ref 3.5–5.0)
ALK PHOS: 87 U/L (ref 40–150)
ALT: 13 U/L (ref 0–55)
ANION GAP: 11 meq/L (ref 3–11)
AST: 15 U/L (ref 5–34)
BUN: 8.9 mg/dL (ref 7.0–26.0)
CO2: 24 meq/L (ref 22–29)
Calcium: 9.3 mg/dL (ref 8.4–10.4)
Chloride: 106 mEq/L (ref 98–109)
Creatinine: 0.7 mg/dL (ref 0.6–1.1)
GLUCOSE: 149 mg/dL — AB (ref 70–140)
POTASSIUM: 3.5 meq/L (ref 3.5–5.1)
SODIUM: 141 meq/L (ref 136–145)
TOTAL PROTEIN: 7.2 g/dL (ref 6.4–8.3)

## 2015-01-23 MED ORDER — SODIUM CHLORIDE 0.9 % IJ SOLN
10.0000 mL | INTRAMUSCULAR | Status: DC | PRN
  Administered 2015-01-23: 10 mL via INTRAVENOUS
  Filled 2015-01-23: qty 10

## 2015-01-23 MED ORDER — SODIUM CHLORIDE 0.9 % IV SOLN
Freq: Once | INTRAVENOUS | Status: AC
  Administered 2015-01-23: 11:00:00 via INTRAVENOUS
  Filled 2015-01-23: qty 5

## 2015-01-23 MED ORDER — SODIUM CHLORIDE 0.9 % IJ SOLN
10.0000 mL | INTRAMUSCULAR | Status: DC | PRN
  Administered 2015-01-23: 10 mL
  Filled 2015-01-23: qty 10

## 2015-01-23 MED ORDER — PACLITAXEL PROTEIN-BOUND CHEMO INJECTION 100 MG
55.0000 mg/m2 | Freq: Once | INTRAVENOUS | Status: AC
  Administered 2015-01-23: 100 mg via INTRAVENOUS
  Filled 2015-01-23: qty 20

## 2015-01-23 MED ORDER — HEPARIN SOD (PORK) LOCK FLUSH 100 UNIT/ML IV SOLN
500.0000 [IU] | Freq: Once | INTRAVENOUS | Status: AC | PRN
  Administered 2015-01-23: 500 [IU]
  Filled 2015-01-23: qty 5

## 2015-01-23 MED ORDER — PALONOSETRON HCL INJECTION 0.25 MG/5ML
0.2500 mg | Freq: Once | INTRAVENOUS | Status: AC
  Administered 2015-01-23: 0.25 mg via INTRAVENOUS

## 2015-01-23 MED ORDER — PALONOSETRON HCL INJECTION 0.25 MG/5ML
INTRAVENOUS | Status: AC
  Filled 2015-01-23: qty 5

## 2015-01-23 MED ORDER — SODIUM CHLORIDE 0.9 % IV SOLN
Freq: Once | INTRAVENOUS | Status: AC
  Administered 2015-01-23: 11:00:00 via INTRAVENOUS

## 2015-01-23 NOTE — Telephone Encounter (Signed)
Appointments made and patient will get a new avs in chemo °

## 2015-01-23 NOTE — Patient Instructions (Signed)
[facility] Discharge Instructions for Patients Receiving Chemotherapy  Today you received the following chemotherapy agents; Abraxene.   To help prevent nausea and vomiting after your treatment, we encourage you to take your nausea medication as directed.    If you develop nausea and vomiting that is not controlled by your nausea medication, call the clinic.   BELOW ARE SYMPTOMS THAT SHOULD BE REPORTED IMMEDIATELY:  *FEVER GREATER THAN 100.5 F  *CHILLS WITH OR WITHOUT FEVER  NAUSEA AND VOMITING THAT IS NOT CONTROLLED WITH YOUR NAUSEA MEDICATION  *UNUSUAL SHORTNESS OF BREATH  *UNUSUAL BRUISING OR BLEEDING  TENDERNESS IN MOUTH AND THROAT WITH OR WITHOUT PRESENCE OF ULCERS  *URINARY PROBLEMS  *BOWEL PROBLEMS  UNUSUAL RASH Items with * indicate a potential emergency and should be followed up as soon as possible.  Feel free to call the clinic you have any questions or concerns. The clinic phone number is [phone].  Please show the CHEMO ALERT CARD at check-in to the Emergency Department and triage nurse.   

## 2015-01-23 NOTE — Assessment & Plan Note (Signed)
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 Neo-adjuvant chemotherapy with dose dense Adriamycin and Cytoxan 4 followed by weekly Abraxane and carboplatin 12 2. Followed by surgery 3. Followed by radiation therapy  Current Treatment: completed 4 cycles of adriamycin and Cytoxan, today is cycle 9/12 Abraxane and carboplatin (held last week for neutropenia and thrombocytopenia)  Chemo Toxicities: 1. Constipation: Improved with Miralax 2. alopecia 3. Grade 3 neutropenia with cycle 9 of Abraxane and carboplatin. We gave Neupogen injections on 11/ 7, 11/ 8,11/11and carboplatin was DCed with cycle 9. 4. Anemia due to chemotherapy: monitoring hemoglobin asymptomatic, hemoglobin continues to remain low 5. Skin and nail discoloration: Due to chemotherapy, especially on the hands 6. Severe fatigue: Unfortunately this is due to chemotherapy and not much can be done to help improve her fatigue. I recommended that she increase her protein intake in the diet. 7. Thrombocytopenia: Reduced the dosage of chemotherapy with cycle 8 in spite of this her platelet counts did not improve. We discontinued carboplatin from cycle 9. 8. Anticipatory nausea: I added Emend to her regimen with cycle 8, no further episodes of nausea 9. Grade 1 neuropathy Monitoring closely for toxicities Return to clinic in 1 week for cycle 10 of Abraxane alone.

## 2015-01-23 NOTE — Progress Notes (Signed)
Patient Care Team: Elisabeth Cara, PA-C as PCP - General (Family Medicine) [name], MD as Consulting Physician (General Surgery) Nicholas Lose, MD as Consulting Physician (Hematology and Oncology) Eppie Gibson, MD as Attending Physician (Radiation Oncology) Rockwell Germany, RN as Registered Nurse Mauro Kaufmann, RN as Registered Nurse  DIAGNOSIS: Breast cancer of lower-inner quadrant of right female breast [facility])   Staging form: Breast, AJCC 7th Edition     Clinical stage from 09/07/2014: Stage IIIC (T2, N3, M0) - Unsigned       Staging comments: Staged at breast conference on 6.29.16    SUMMARY OF ONCOLOGIC HISTORY:   Breast cancer of lower-inner quadrant of right female breast ([place])   08/29/2014 Mammogram Right breast mass 5 x 4.8 x 4.2 cm, 6 enlarged axillary lymph nodes including nodes in level III location, T2 N3 M0 stage IIIc clinical stage   08/31/2014 Initial Diagnosis Right breast biopsy 6:00: Invasive ductal carcinoma, right axillary lymph node biopsy high-grade carcinoma ER 0% PR 0% HER-2 negative ratio 1.68, Ki-67 80%, grade 3   09/19/2014 -  Neo-Adjuvant Chemotherapy Adriamycin Cytoxan dose dense 4 followed by Abraxane and carboplatin weekly 12    CHIEF COMPLIANT: cycle 9 Neoadjuvant chemotherapy, Abraxane alone cycle 9  INTERVAL HISTORY: Brandy Douglas is a 45 year old with above-mentioned history right breast cancer currently on neoadjuvant chemotherapy with carboplatin and Abraxane weekly. We discontinued carboplatin from cycle 9 onwards. Abraxane dose was also reduced because of neutropenia and thrombus cytopenia. Last week treatment was not given and she received Neupogen injections. She is here to receive single agent Abraxane today. She reports neuropathy in the feet is on both her toes. None in the hands.  REVIEW OF SYSTEMS:   Constitutional: Denies fevers, chills or abnormal weight loss Eyes: Denies blurriness of vision Ears, nose, mouth, throat, and  face: Denies mucositis or sore throat Respiratory: Denies cough, dyspnea or wheezes Cardiovascular: Denies palpitation, chest discomfort or lower extremity swelling Gastrointestinal:  Denies nausea, heartburn or change in bowel habits Skin: Denies abnormal skin rashes Lymphatics: Denies new lymphadenopathy or easy bruising Neurological:tingling and numbness in the toes Behavioral/Psych: Mood is stable, no new changes   All other systems were reviewed with the patient and are negative.  I have reviewed the past medical history, past surgical history, social history and family history with the patient and they are unchanged from previous note.  ALLERGIES:  has No Known Allergies.  MEDICATIONS:  Current Outpatient Prescriptions  Medication Sig Dispense Refill  . dexamethasone (DECADRON) 4 MG tablet     . HYDROcodone-acetaminophen (NORCO/VICODIN) 5-325 MG per tablet Take 1-2 tablets by mouth every 6 (six) hours as needed. 30 tablet 0  . lidocaine-prilocaine (EMLA) cream Apply to affected area once 30 g 3  . LORazepam (ATIVAN) 0.5 MG tablet Take 1 tablet (0.5 mg total) by mouth at bedtime. 30 tablet 0  . naproxen sodium (RA NAPROXEN SODIUM) 220 MG tablet Take 440 mg by mouth.    . ondansetron (ZOFRAN) 8 MG tablet Take 1 tablet (8 mg total) by mouth 2 (two) times daily as needed. Start on the third day after chemotherapy. 30 tablet 1  . prochlorperazine (COMPAZINE) 10 MG tablet Take 1 tablet (10 mg total) by mouth every 6 (six) hours as needed (Nausea or vomiting). 30 tablet 1   No current facility-administered medications for this visit.    PHYSICAL EXAMINATION: ECOG PERFORMANCE STATUS: 1 - Symptomatic but completely ambulatory  Filed Vitals:   01/23/15 1031  BP: 130/79  Pulse: 115  Temp: 98.3 F (36.8 C)  Resp: 19   Filed Weights   01/23/15 1031  Weight: 173 lb 3.2 oz (78.563 kg)    GENERAL:alert, no distress and comfortable SKIN: skin color, texture, turgor are normal, no  rashes or significant lesions EYES: normal, Conjunctiva are pink and non-injected, sclera clear OROPHARYNX:no exudate, no erythema and lips, buccal mucosa, and tongue normal  NECK: supple, thyroid normal size, non-tender, without nodularity LYMPH:  no palpable lymphadenopathy in the cervical, axillary or inguinal LUNGS: clear to auscultation and percussion with normal breathing effort HEART: regular rate & rhythm and no murmurs and no lower extremity edema ABDOMEN:abdomen soft, non-tender and normal bowel sounds Musculoskeletal:no cyanosis of digits and no clubbing  NEURO: alert & oriented x 3 with fluent speech, no focal motor/sensory deficits  LABORATORY DATA:  I have reviewed the data as listed   Chemistry      Component Value Date/Time   NA 141 01/16/2015 0813   K 3.6 01/16/2015 0813   CO2 23 01/16/2015 0813   BUN 6.7* 01/16/2015 0813   CREATININE 0.7 01/16/2015 0813      Component Value Date/Time   CALCIUM 9.3 01/16/2015 0813   ALKPHOS 68 01/16/2015 0813   AST 15 01/16/2015 0813   ALT 12 01/16/2015 0813   BILITOT 0.37 01/16/2015 0813       Lab Results  Component Value Date   WBC 2.4* 01/16/2015   HGB 8.2* 01/16/2015   HCT 24.2* 01/16/2015   MCV 97.5 01/16/2015   PLT 78* 01/16/2015   NEUTROABS 1.1* 01/16/2015   ASSESSMENT & PLAN:  Breast cancer of lower-inner quadrant of right female breast Right breast mass 5 x 4.8 x 4.2 cm, 6 enlarged axillary lymph nodes including nodes in level III location, T2 N3 M0 stage IIIc clinical stage Right breast biopsy 6:00: Invasive ductal carcinoma, 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 Neo-adjuvant chemotherapy with dose dense Adriamycin and Cytoxan 4 followed by weekly Abraxane and carboplatin 12 2. Followed by surgery 3. Followed by radiation therapy  Current Treatment: completed 4 cycles of adriamycin and Cytoxan, today is cycle  9/12 Abraxane and carboplatin (held last week for neutropenia and thrombocytopenia)  Chemo Toxicities: 1. Constipation: Improved with Miralax 2. alopecia 3. Grade 3 neutropenia with cycle 9 of Abraxane and carboplatin. We gave Neupogen injections on 11/ 7, 11/ 8,11/11and carboplatin was DCed with cycle 9. 4. Anemia due to chemotherapy: monitoring hemoglobin asymptomatic, today's hemoglobin is 9 g 5. Skin and nail discoloration: Due to chemotherapy, especially on the hands 6. Severe fatigue: Unfortunately this is due to chemotherapy and not much can be done to help improve her fatigue. I recommended that she increase her protein intake in the diet. 7. Thrombocytopenia: Reduced the dosage of chemotherapy with cycle 8 in spite of this her platelet counts did not improve. We discontinued carboplatin from cycle 9. 8. Anticipatory nausea: I added Emend to her regimen with cycle 8, no further episodes of nausea 9. Grade 1 neuropathy: In her toes monitoring closely  Monitoring closely for toxicities Return to clinic in 1 week for cycle 10 of Abraxane alone.  No orders of the defined types were placed in this encounter.   The patient has a good understanding of the overall plan. she agrees with it. she will call with any problems that may develop before the next visit here.  Rulon Eisenmenger, MD 01/23/2015

## 2015-01-23 NOTE — Addendum Note (Signed)
Addended by: Prentiss Bells on: 01/23/2015 04:25 PM   Modules accepted: Medications

## 2015-01-30 ENCOUNTER — Ambulatory Visit (HOSPITAL_BASED_OUTPATIENT_CLINIC_OR_DEPARTMENT_OTHER): Payer: 59 | Admitting: Hematology and Oncology

## 2015-01-30 ENCOUNTER — Ambulatory Visit (HOSPITAL_BASED_OUTPATIENT_CLINIC_OR_DEPARTMENT_OTHER): Payer: 59

## 2015-01-30 ENCOUNTER — Other Ambulatory Visit (HOSPITAL_BASED_OUTPATIENT_CLINIC_OR_DEPARTMENT_OTHER): Payer: 59

## 2015-01-30 ENCOUNTER — Other Ambulatory Visit: Payer: 59

## 2015-01-30 ENCOUNTER — Telehealth: Payer: Self-pay | Admitting: Hematology and Oncology

## 2015-01-30 ENCOUNTER — Ambulatory Visit: Payer: 59

## 2015-01-30 VITALS — BP 121/76 | HR 88 | Temp 98.9°F | Resp 18 | Ht 67.0 in | Wt 172.2 lb

## 2015-01-30 DIAGNOSIS — Z5111 Encounter for antineoplastic chemotherapy: Secondary | ICD-10-CM

## 2015-01-30 DIAGNOSIS — C50311 Malignant neoplasm of lower-inner quadrant of right female breast: Secondary | ICD-10-CM | POA: Diagnosis not present

## 2015-01-30 DIAGNOSIS — Z95828 Presence of other vascular implants and grafts: Secondary | ICD-10-CM

## 2015-01-30 LAB — COMPREHENSIVE METABOLIC PANEL (CC13)
ALT: 11 U/L (ref 0–55)
ANION GAP: 11 meq/L (ref 3–11)
AST: 14 U/L (ref 5–34)
Albumin: 3.6 g/dL (ref 3.5–5.0)
Alkaline Phosphatase: 71 U/L (ref 40–150)
BUN: 10.4 mg/dL (ref 7.0–26.0)
CALCIUM: 9.6 mg/dL (ref 8.4–10.4)
CHLORIDE: 107 meq/L (ref 98–109)
CO2: 22 meq/L (ref 22–29)
Creatinine: 0.7 mg/dL (ref 0.6–1.1)
Glucose: 126 mg/dl (ref 70–140)
Potassium: 3.8 mEq/L (ref 3.5–5.1)
Sodium: 140 mEq/L (ref 136–145)
TOTAL PROTEIN: 7.6 g/dL (ref 6.4–8.3)

## 2015-01-30 LAB — CBC WITH DIFFERENTIAL/PLATELET
BASO%: 0.3 % (ref 0.0–2.0)
BASOS ABS: 0 10*3/uL (ref 0.0–0.1)
EOS ABS: 0 10*3/uL (ref 0.0–0.5)
EOS%: 0.3 % (ref 0.0–7.0)
HEMATOCRIT: 25.8 % — AB (ref 34.8–46.6)
HGB: 8.8 g/dL — ABNORMAL LOW (ref 11.6–15.9)
LYMPH%: 29.3 % (ref 14.0–49.7)
MCH: 34.3 pg — ABNORMAL HIGH (ref 25.1–34.0)
MCHC: 34 g/dL (ref 31.5–36.0)
MCV: 100.9 fL (ref 79.5–101.0)
MONO#: 0.4 10*3/uL (ref 0.1–0.9)
MONO%: 8.5 % (ref 0.0–14.0)
NEUT#: 3 10*3/uL (ref 1.5–6.5)
NEUT%: 61.6 % (ref 38.4–76.8)
PLATELETS: 225 10*3/uL (ref 145–400)
RBC: 2.56 10*6/uL — AB (ref 3.70–5.45)
RDW: 20 % — ABNORMAL HIGH (ref 11.2–14.5)
WBC: 4.9 10*3/uL (ref 3.9–10.3)
lymph#: 1.4 10*3/uL (ref 0.9–3.3)

## 2015-01-30 MED ORDER — PACLITAXEL PROTEIN-BOUND CHEMO INJECTION 100 MG
55.0000 mg/m2 | Freq: Once | INTRAVENOUS | Status: AC
  Administered 2015-01-30: 100 mg via INTRAVENOUS
  Filled 2015-01-30: qty 20

## 2015-01-30 MED ORDER — SODIUM CHLORIDE 0.9 % IJ SOLN
10.0000 mL | INTRAMUSCULAR | Status: DC | PRN
  Administered 2015-01-30: 10 mL
  Filled 2015-01-30: qty 10

## 2015-01-30 MED ORDER — SODIUM CHLORIDE 0.9 % IJ SOLN
10.0000 mL | INTRAMUSCULAR | Status: DC | PRN
  Administered 2015-01-30: 10 mL via INTRAVENOUS
  Filled 2015-01-30: qty 10

## 2015-01-30 MED ORDER — PALONOSETRON HCL INJECTION 0.25 MG/5ML
INTRAVENOUS | Status: AC
  Filled 2015-01-30: qty 5

## 2015-01-30 MED ORDER — SODIUM CHLORIDE 0.9 % IV SOLN
Freq: Once | INTRAVENOUS | Status: AC
  Administered 2015-01-30: 11:00:00 via INTRAVENOUS

## 2015-01-30 MED ORDER — PALONOSETRON HCL INJECTION 0.25 MG/5ML
0.2500 mg | Freq: Once | INTRAVENOUS | Status: AC
  Administered 2015-01-30: 0.25 mg via INTRAVENOUS

## 2015-01-30 MED ORDER — HEPARIN SOD (PORK) LOCK FLUSH 100 UNIT/ML IV SOLN
500.0000 [IU] | Freq: Once | INTRAVENOUS | Status: AC | PRN
  Administered 2015-01-30: 500 [IU]
  Filled 2015-01-30: qty 5

## 2015-01-30 MED ORDER — SODIUM CHLORIDE 0.9 % IV SOLN
Freq: Once | INTRAVENOUS | Status: AC
  Administered 2015-01-30: 11:00:00 via INTRAVENOUS
  Filled 2015-01-30: qty 5

## 2015-01-30 NOTE — Patient Instructions (Signed)

## 2015-01-30 NOTE — Assessment & Plan Note (Signed)
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 Neo-adjuvant chemotherapy with dose dense Adriamycin and Cytoxan 4 followed by weekly Abraxane and carboplatin 12 2. Followed by surgery 3. Followed by radiation therapy  Current Treatment: completed 4 cycles of adriamycin and Cytoxan, was on Abraxane and carboplatin 8 cycles (carboplatin discontinued for thrombocytopenia from cycle 9) today is cycle 10/12 Abraxane Chemo Toxicities: 1. Constipation: Improved with Miralax 2. alopecia 3. Grade 3 neutropenia with cycle 9 of Abraxane and carboplatin. We gave Neupogen injections on 11/ 7, 11/ 8,11/11and carboplatin was DCed with cycle 9. 4. Anemia due to chemotherapy: monitoring hemoglobin asymptomatic, today's hemoglobin is 9 g 5. Skin and nail discoloration: Due to chemotherapy, especially on the hands 6. Severe fatigue: Unfortunately this is due to chemotherapy and not much can be done to help improve her fatigue. I recommended that she increase her protein intake in the diet. 7. Thrombocytopenia: Reduced the dosage of chemotherapy with cycle 8 in spite of this, her platelet counts did not improve. We discontinued carboplatin from cycle 9. 8. Anticipatory nausea: I added Emend to her regimen with cycle 8, no further episodes of nausea 9. Grade 1 neuropathy: In her toes monitoring closely  Monitoring closely for toxicities  Plan: 1. Breast MRI after cycle 12 Abraxane 2. Tumor board presentation 3. Appointment with her surgeon to discuss surgical options (patient is not eligible for ALLIANCE trial because of N3 disease)   Return to clinic in 1 week for cycle 11 of Abraxane alone.

## 2015-01-30 NOTE — Patient Instructions (Signed)
[facility] Discharge Instructions for Patients Receiving Chemotherapy  Today you received the following chemotherapy agents Abraxane To help prevent nausea and vomiting after your treatment, we encourage you to take your nausea medication as prescribed.   If you develop nausea and vomiting that is not controlled by your nausea medication, call the clinic.   BELOW ARE SYMPTOMS THAT SHOULD BE REPORTED IMMEDIATELY:  *FEVER GREATER THAN 100.5 F  *CHILLS WITH OR WITHOUT FEVER  NAUSEA AND VOMITING THAT IS NOT CONTROLLED WITH YOUR NAUSEA MEDICATION  *UNUSUAL SHORTNESS OF BREATH  *UNUSUAL BRUISING OR BLEEDING  TENDERNESS IN MOUTH AND THROAT WITH OR WITHOUT PRESENCE OF ULCERS  *URINARY PROBLEMS  *BOWEL PROBLEMS  UNUSUAL RASH Items with * indicate a potential emergency and should be followed up as soon as possible.  Feel free to call the clinic you have any questions or concerns. The clinic phone number is [phone].  Please show the CHEMO ALERT CARD at check-in to the Emergency Department and triage nurse.   

## 2015-01-30 NOTE — Telephone Encounter (Signed)
Appointments made and avs printed for patient and she has the number to call for her mri

## 2015-01-30 NOTE — Progress Notes (Signed)
Patient Care Team: Elisabeth Cara, PA-C as PCP - General (Family Medicine) [name], MD as Consulting Physician (General Surgery) Nicholas Lose, MD as Consulting Physician (Hematology and Oncology) Eppie Gibson, MD as Attending Physician (Radiation Oncology) Rockwell Germany, RN as Registered Nurse Mauro Kaufmann, RN as Registered Nurse  DIAGNOSIS: Breast cancer of lower-inner quadrant of right female breast [facility])   Staging form: Breast, AJCC 7th Edition     Clinical stage from 09/07/2014: Stage IIIC (T2, N3, M0) - Unsigned       Staging comments: Staged at breast conference on 6.29.16    SUMMARY OF ONCOLOGIC HISTORY:   Breast cancer of lower-inner quadrant of right female breast ([place])   08/29/2014 Mammogram Right breast mass 5 x 4.8 x 4.2 cm, 6 enlarged axillary lymph nodes including nodes in level III location, T2 N3 M0 stage IIIc clinical stage   08/31/2014 Initial Diagnosis Right breast biopsy 6:00: Invasive ductal carcinoma, right axillary lymph node biopsy high-grade carcinoma ER 0% PR 0% HER-2 negative ratio 1.68, Ki-67 80%, grade 3    Procedure Genetic testing Negative   09/19/2014 -  Neo-Adjuvant Chemotherapy Adriamycin Cytoxan dose dense 4 followed by Abraxane and carboplatin weekly 12    CHIEF COMPLIANT: Cycle 10 Abraxane  INTERVAL HISTORY: Brandy Douglas is a 44 year old with above-mentioned history of right-sided breast cancer currently on neoadjuvant chemotherapy with Abraxane and carboplatin. Carboplatin was discontinued after cycle 8. She receives Neupogen injections prior to each Abraxane treatment. There appeared to be keeping her blood count stable. Thrombocytopenia has improved since carboplatin was discontinued. She does not have any worsening neuropathy symptoms. Denies any nausea vomiting or GI symptoms. Her taste is coming back. Still feels moderately tired.  REVIEW OF SYSTEMS:   Constitutional: Denies fevers, chills or abnormal weight loss Eyes: Denies  blurriness of vision Ears, nose, mouth, throat, and face: Denies mucositis or sore throat Respiratory: Denies cough, dyspnea or wheezes Cardiovascular: Denies palpitation, chest discomfort or lower extremity swelling Gastrointestinal:  Denies nausea, heartburn or change in bowel habits Skin: Denies abnormal skin rashes Lymphatics: Denies new lymphadenopathy or easy bruising Neurological Complains of tingling and numbness of the fingers and toes of her feet  Behavioral/Psych: Mood is stable, no new changes   All other systems were reviewed with the patient and are negative.  I have reviewed the past medical history, past surgical history, social history and family history with the patient and they are unchanged from previous note.  ALLERGIES:  has No Known Allergies.  MEDICATIONS:  Current Outpatient Prescriptions  Medication Sig Dispense Refill  . dexamethasone (DECADRON) 4 MG tablet     . HYDROcodone-acetaminophen (NORCO/VICODIN) 5-325 MG per tablet Take 1-2 tablets by mouth every 6 (six) hours as needed. 30 tablet 0  . lidocaine-prilocaine (EMLA) cream Apply to affected area once 30 g 3  . LORazepam (ATIVAN) 0.5 MG tablet Take 1 tablet (0.5 mg total) by mouth at bedtime. 30 tablet 0  . naproxen sodium (RA NAPROXEN SODIUM) 220 MG tablet Take 440 mg by mouth.    . ondansetron (ZOFRAN) 8 MG tablet Take 1 tablet (8 mg total) by mouth 2 (two) times daily as needed. Start on the third day after chemotherapy. 30 tablet 1  . prochlorperazine (COMPAZINE) 10 MG tablet Take 1 tablet (10 mg total) by mouth every 6 (six) hours as needed (Nausea or vomiting). 30 tablet 1   No current facility-administered medications for this visit.    PHYSICAL EXAMINATION: ECOG PERFORMANCE STATUS:  1 - Symptomatic but completely ambulatory  Filed Vitals:   01/30/15 0951  BP: 121/76  Pulse: 88  Temp: 98.9 F (37.2 C)  Resp: 18   Filed Weights   01/30/15 0951  Weight: 172 lb 3.2 oz (78.109 kg)     GENERAL:alert, no distress and comfortable SKIN: skin color, texture, turgor are normal, no rashes or significant lesions EYES: normal, Conjunctiva are pink and non-injected, sclera clear OROPHARYNX:no exudate, no erythema and lips, buccal mucosa, and tongue normal  NECK: supple, thyroid normal size, non-tender, without nodularity LYMPH:  no palpable lymphadenopathy in the cervical, axillary or inguinal LUNGS: clear to auscultation and percussion with normal breathing effort HEART: regular rate & rhythm and no murmurs and no lower extremity edema ABDOMEN:abdomen soft, non-tender and normal bowel sounds Musculoskeletal:no cyanosis of digits and no clubbing  NEURO: alert & oriented x 3 with fluent speech, neuropathy in the feet   LABORATORY DATA:  I have reviewed the data as listed   Chemistry      Component Value Date/Time   NA 141 01/23/2015 0959   K 3.5 01/23/2015 0959   CO2 24 01/23/2015 0959   BUN 8.9 01/23/2015 0959   CREATININE 0.7 01/23/2015 0959      Component Value Date/Time   CALCIUM 9.3 01/23/2015 0959   ALKPHOS 87 01/23/2015 0959   AST 15 01/23/2015 0959   ALT 13 01/23/2015 0959   BILITOT <0.30 01/23/2015 0959       Lab Results  Component Value Date   WBC 4.9 01/30/2015   HGB 8.8* 01/30/2015   HCT 25.8* 01/30/2015   MCV 100.9 01/30/2015   PLT 225 01/30/2015   NEUTROABS 3.0 01/30/2015   ASSESSMENT & PLAN:  Breast cancer of lower-inner quadrant of right female breast Right breast mass 5 x 4.8 x 4.2 cm, 6 enlarged axillary lymph nodes including nodes in level III location, T2 N3 M0 stage IIIc clinical stage Right breast biopsy 6:00: Invasive ductal carcinoma, 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 Neo-adjuvant chemotherapy with dose dense Adriamycin and Cytoxan 4 followed by weekly Abraxane and carboplatin 12 2. Followed by surgery 3. Followed by radiation  therapy  Current Treatment: completed 4 cycles of adriamycin and Cytoxan, was on Abraxane and carboplatin 8 cycles (carboplatin discontinued for thrombocytopenia from cycle 9) today is cycle 10/12 Abraxane Chemo Toxicities: 1. Constipation: Improved with Miralax 2. alopecia 3. Grade 3 neutropenia with cycle 9 of Abraxane and carboplatin. We gave Neupogen injections on 11/ 7, 11/ 8,11/11and carboplatin was DCed with cycle 9. 4. Anemia due to chemotherapy: monitoring hemoglobin asymptomatic, today's hemoglobin is 9 g 5. Skin and nail discoloration: Due to chemotherapy, especially on the hands 6. Severe fatigue: Unfortunately this is due to chemotherapy and not much can be done to help improve her fatigue. I recommended that she increase her protein intake in the diet. 7. Thrombocytopenia: Reduced the dosage of chemotherapy with cycle 8 in spite of this, her platelet counts did not improve. We discontinued carboplatin from cycle 9. 8. Anticipatory nausea: I added Emend to her regimen with cycle 8, no further episodes of nausea 9. Grade 1 neuropathy: In her toes monitoring closely  Monitoring closely for toxicities  Plan: 1. Breast MRI after cycle 12 Abraxane to be done 12/12 2. Tumor board presentation 12/14 3. Appointment with her surgeon to discuss surgical options (patient is not eligible for ALLIANCE trial because  of N3 disease), sent message to Dr.Newman.   Return to clinic in 2 weeks for cycle 12 of Abraxane alone.   Orders Placed This Encounter  Procedures  . MR Breast Bilateral W Wo Contrast    Standing Status: Future     Number of Occurrences:      Standing Expiration Date: 03/31/2016    Order Specific Question:  If indicated for the ordered procedure, I authorize the administration of contrast media per Radiology protocol    Answer:  Yes    Order Specific Question:  Reason for Exam (SYMPTOM  OR DIAGNOSIS REQUIRED)    Answer:  Breast cancer post neoadjuvant chemo    Order  Specific Question:  Preferred imaging location?    Answer:  [id number]    Order Specific Question:  Does the patient have a pacemaker or implanted devices?    Answer:  No    Order Specific Question:  What is the patient's sedation requirement?    Answer:  No Sedation   The patient has a good understanding of the overall plan. she agrees with it. she will call with any problems that may develop before the next visit here.   Rulon Eisenmenger, MD 01/30/2015

## 2015-02-06 ENCOUNTER — Ambulatory Visit: Payer: 59

## 2015-02-06 ENCOUNTER — Ambulatory Visit (HOSPITAL_BASED_OUTPATIENT_CLINIC_OR_DEPARTMENT_OTHER): Payer: 59

## 2015-02-06 ENCOUNTER — Other Ambulatory Visit (HOSPITAL_BASED_OUTPATIENT_CLINIC_OR_DEPARTMENT_OTHER): Payer: 59

## 2015-02-06 VITALS — BP 112/73 | HR 87 | Temp 98.4°F

## 2015-02-06 DIAGNOSIS — Z5111 Encounter for antineoplastic chemotherapy: Secondary | ICD-10-CM | POA: Diagnosis not present

## 2015-02-06 DIAGNOSIS — C50311 Malignant neoplasm of lower-inner quadrant of right female breast: Secondary | ICD-10-CM

## 2015-02-06 DIAGNOSIS — Z95828 Presence of other vascular implants and grafts: Secondary | ICD-10-CM

## 2015-02-06 LAB — CBC WITH DIFFERENTIAL/PLATELET
BASO%: 0.3 % (ref 0.0–2.0)
Basophils Absolute: 0 10*3/uL (ref 0.0–0.1)
EOS ABS: 0 10*3/uL (ref 0.0–0.5)
EOS%: 0.2 % (ref 0.0–7.0)
HEMATOCRIT: 26.7 % — AB (ref 34.8–46.6)
HGB: 8.7 g/dL — ABNORMAL LOW (ref 11.6–15.9)
LYMPH#: 1.5 10*3/uL (ref 0.9–3.3)
LYMPH%: 38.2 % (ref 14.0–49.7)
MCH: 33.3 pg (ref 25.1–34.0)
MCHC: 32.7 g/dL (ref 31.5–36.0)
MCV: 101.8 fL — AB (ref 79.5–101.0)
MONO#: 0.5 10*3/uL (ref 0.1–0.9)
MONO%: 12.6 % (ref 0.0–14.0)
NEUT#: 1.9 10*3/uL (ref 1.5–6.5)
NEUT%: 48.7 % (ref 38.4–76.8)
PLATELETS: 300 10*3/uL (ref 145–400)
RBC: 2.62 10*6/uL — ABNORMAL LOW (ref 3.70–5.45)
RDW: 20.3 % — ABNORMAL HIGH (ref 11.2–14.5)
WBC: 4 10*3/uL (ref 3.9–10.3)

## 2015-02-06 LAB — COMPREHENSIVE METABOLIC PANEL (CC13)
ALBUMIN: 3.6 g/dL (ref 3.5–5.0)
ALK PHOS: 73 U/L (ref 40–150)
ALT: 10 U/L (ref 0–55)
AST: 18 U/L (ref 5–34)
Anion Gap: 9 mEq/L (ref 3–11)
BUN: 10.6 mg/dL (ref 7.0–26.0)
CALCIUM: 9.5 mg/dL (ref 8.4–10.4)
CO2: 25 mEq/L (ref 22–29)
CREATININE: 0.7 mg/dL (ref 0.6–1.1)
Chloride: 106 mEq/L (ref 98–109)
EGFR: >90 mL/min/{1.73_m2} (ref >90)
GLUCOSE: 97 mg/dL (ref 70–140)
POTASSIUM: 4.1 meq/L (ref 3.5–5.1)
Sodium: 140 mEq/L (ref 136–145)
TOTAL PROTEIN: 7.8 g/dL (ref 6.4–8.3)
Total Bilirubin: <0.3 mg/dL (ref 0.20–1.20)

## 2015-02-06 MED ORDER — SODIUM CHLORIDE 0.9 % IV SOLN
Freq: Once | INTRAVENOUS | Status: AC
  Administered 2015-02-06: 15:00:00 via INTRAVENOUS
  Filled 2015-02-06: qty 5

## 2015-02-06 MED ORDER — SODIUM CHLORIDE 0.9 % IV SOLN
Freq: Once | INTRAVENOUS | Status: AC
  Administered 2015-02-06: 15:00:00 via INTRAVENOUS

## 2015-02-06 MED ORDER — SODIUM CHLORIDE 0.9 % IJ SOLN
10.0000 mL | INTRAMUSCULAR | Status: DC | PRN
  Administered 2015-02-06: 10 mL via INTRAVENOUS
  Filled 2015-02-06: qty 10

## 2015-02-06 MED ORDER — PALONOSETRON HCL INJECTION 0.25 MG/5ML
INTRAVENOUS | Status: AC
  Filled 2015-02-06: qty 5

## 2015-02-06 MED ORDER — HEPARIN SOD (PORK) LOCK FLUSH 100 UNIT/ML IV SOLN
500.0000 [IU] | Freq: Once | INTRAVENOUS | Status: AC | PRN
  Administered 2015-02-06: 500 [IU]
  Filled 2015-02-06: qty 5

## 2015-02-06 MED ORDER — PALONOSETRON HCL INJECTION 0.25 MG/5ML
0.2500 mg | Freq: Once | INTRAVENOUS | Status: AC
  Administered 2015-02-06: 0.25 mg via INTRAVENOUS

## 2015-02-06 MED ORDER — PACLITAXEL PROTEIN-BOUND CHEMO INJECTION 100 MG
55.0000 mg/m2 | Freq: Once | INTRAVENOUS | Status: AC
  Administered 2015-02-06: 100 mg via INTRAVENOUS
  Filled 2015-02-06: qty 20

## 2015-02-06 MED ORDER — SODIUM CHLORIDE 0.9 % IJ SOLN
10.0000 mL | INTRAMUSCULAR | Status: DC | PRN
  Administered 2015-02-06: 10 mL
  Filled 2015-02-06: qty 10

## 2015-02-06 NOTE — Patient Instructions (Signed)
[facility] Discharge Instructions for Patients Receiving Chemotherapy  Today you received the following chemotherapy agents: Abraxane   To help prevent nausea and vomiting after your treatment, we encourage you to take your nausea medication as directed.    If you develop nausea and vomiting that is not controlled by your nausea medication, call the clinic.   BELOW ARE SYMPTOMS THAT SHOULD BE REPORTED IMMEDIATELY:  *FEVER GREATER THAN 100.5 F  *CHILLS WITH OR WITHOUT FEVER  NAUSEA AND VOMITING THAT IS NOT CONTROLLED WITH YOUR NAUSEA MEDICATION  *UNUSUAL SHORTNESS OF BREATH  *UNUSUAL BRUISING OR BLEEDING  TENDERNESS IN MOUTH AND THROAT WITH OR WITHOUT PRESENCE OF ULCERS  *URINARY PROBLEMS  *BOWEL PROBLEMS  UNUSUAL RASH Items with * indicate a potential emergency and should be followed up as soon as possible.  Feel free to call the clinic you have any questions or concerns. The clinic phone number is [phone].  Please show the CHEMO ALERT CARD at check-in to the Emergency Department and triage nurse.   

## 2015-02-06 NOTE — Patient Instructions (Signed)

## 2015-02-13 ENCOUNTER — Other Ambulatory Visit: Payer: 59

## 2015-02-13 ENCOUNTER — Telehealth: Payer: Self-pay | Admitting: Hematology and Oncology

## 2015-02-13 ENCOUNTER — Ambulatory Visit (HOSPITAL_BASED_OUTPATIENT_CLINIC_OR_DEPARTMENT_OTHER): Payer: 59 | Admitting: Hematology and Oncology

## 2015-02-13 ENCOUNTER — Encounter: Payer: Self-pay | Admitting: Hematology and Oncology

## 2015-02-13 ENCOUNTER — Ambulatory Visit: Payer: 59

## 2015-02-13 ENCOUNTER — Other Ambulatory Visit (HOSPITAL_BASED_OUTPATIENT_CLINIC_OR_DEPARTMENT_OTHER): Payer: 59

## 2015-02-13 ENCOUNTER — Ambulatory Visit (HOSPITAL_BASED_OUTPATIENT_CLINIC_OR_DEPARTMENT_OTHER): Payer: 59

## 2015-02-13 ENCOUNTER — Encounter: Payer: Self-pay | Admitting: *Deleted

## 2015-02-13 VITALS — BP 113/81 | HR 87 | Temp 98.1°F | Resp 18 | Ht 67.0 in | Wt 172.0 lb

## 2015-02-13 DIAGNOSIS — Z5111 Encounter for antineoplastic chemotherapy: Secondary | ICD-10-CM | POA: Diagnosis not present

## 2015-02-13 DIAGNOSIS — Z171 Estrogen receptor negative status [ER-]: Secondary | ICD-10-CM | POA: Diagnosis not present

## 2015-02-13 DIAGNOSIS — C50311 Malignant neoplasm of lower-inner quadrant of right female breast: Secondary | ICD-10-CM | POA: Diagnosis not present

## 2015-02-13 DIAGNOSIS — T451X5A Adverse effect of antineoplastic and immunosuppressive drugs, initial encounter: Secondary | ICD-10-CM

## 2015-02-13 DIAGNOSIS — G620 Drug-induced polyneuropathy: Secondary | ICD-10-CM

## 2015-02-13 DIAGNOSIS — D6481 Anemia due to antineoplastic chemotherapy: Secondary | ICD-10-CM | POA: Insufficient documentation

## 2015-02-13 DIAGNOSIS — C773 Secondary and unspecified malignant neoplasm of axilla and upper limb lymph nodes: Secondary | ICD-10-CM

## 2015-02-13 DIAGNOSIS — R530 Neoplastic (malignant) related fatigue: Secondary | ICD-10-CM

## 2015-02-13 DIAGNOSIS — L608 Other nail disorders: Secondary | ICD-10-CM

## 2015-02-13 LAB — COMPREHENSIVE METABOLIC PANEL
ALK PHOS: 71 U/L (ref 40–150)
ALT: 9 U/L (ref 0–55)
AST: 16 U/L (ref 5–34)
Albumin: 3.7 g/dL (ref 3.5–5.0)
Anion Gap: 7 mEq/L (ref 3–11)
BUN: 7.8 mg/dL (ref 7.0–26.0)
CHLORIDE: 108 meq/L (ref 98–109)
CO2: 24 meq/L (ref 22–29)
Calcium: 9.4 mg/dL (ref 8.4–10.4)
Creatinine: 0.7 mg/dL (ref 0.6–1.1)
GLUCOSE: 100 mg/dL (ref 70–140)
POTASSIUM: 4.2 meq/L (ref 3.5–5.1)
SODIUM: 139 meq/L (ref 136–145)
Total Bilirubin: <0.3 mg/dL (ref 0.20–1.20)
Total Protein: 7.7 g/dL (ref 6.4–8.3)

## 2015-02-13 LAB — CBC WITH DIFFERENTIAL/PLATELET
BASO%: 1.1 % (ref 0.0–2.0)
BASOS ABS: 0.1 10*3/uL (ref 0.0–0.1)
EOS ABS: 0 10*3/uL (ref 0.0–0.5)
EOS%: 0.3 % (ref 0.0–7.0)
HCT: 26.8 % — ABNORMAL LOW (ref 34.8–46.6)
HGB: 9.3 g/dL — ABNORMAL LOW (ref 11.6–15.9)
LYMPH%: 28.7 % (ref 14.0–49.7)
MCH: 34.9 pg — AB (ref 25.1–34.0)
MCHC: 34.6 g/dL (ref 31.5–36.0)
MCV: 100.8 fL (ref 79.5–101.0)
MONO#: 0.6 10*3/uL (ref 0.1–0.9)
MONO%: 11.1 % (ref 0.0–14.0)
NEUT#: 3 10*3/uL (ref 1.5–6.5)
NEUT%: 58.8 % (ref 38.4–76.8)
Platelets: 341 10*3/uL (ref 145–400)
RBC: 2.66 10*6/uL — AB (ref 3.70–5.45)
RDW: 19.7 % — ABNORMAL HIGH (ref 11.2–14.5)
WBC: 5.1 10*3/uL (ref 3.9–10.3)
lymph#: 1.4 10*3/uL (ref 0.9–3.3)

## 2015-02-13 MED ORDER — SODIUM CHLORIDE 0.9 % IJ SOLN
10.0000 mL | INTRAMUSCULAR | Status: DC | PRN
  Administered 2015-02-13: 10 mL
  Filled 2015-02-13: qty 10

## 2015-02-13 MED ORDER — SODIUM CHLORIDE 0.9 % IJ SOLN
10.0000 mL | Freq: Once | INTRAMUSCULAR | Status: AC
  Administered 2015-02-13: 10 mL via INTRAVENOUS
  Filled 2015-02-13: qty 10

## 2015-02-13 MED ORDER — PACLITAXEL PROTEIN-BOUND CHEMO INJECTION 100 MG
55.0000 mg/m2 | Freq: Once | INTRAVENOUS | Status: AC
  Administered 2015-02-13: 100 mg via INTRAVENOUS
  Filled 2015-02-13: qty 20

## 2015-02-13 MED ORDER — HEPARIN SOD (PORK) LOCK FLUSH 100 UNIT/ML IV SOLN
500.0000 [IU] | Freq: Once | INTRAVENOUS | Status: AC | PRN
  Administered 2015-02-13: 500 [IU]
  Filled 2015-02-13: qty 5

## 2015-02-13 MED ORDER — SODIUM CHLORIDE 0.9 % IV SOLN
Freq: Once | INTRAVENOUS | Status: AC
  Administered 2015-02-13: 10:00:00 via INTRAVENOUS

## 2015-02-13 MED ORDER — PALONOSETRON HCL INJECTION 0.25 MG/5ML
INTRAVENOUS | Status: AC
  Filled 2015-02-13: qty 5

## 2015-02-13 MED ORDER — SODIUM CHLORIDE 0.9 % IV SOLN
Freq: Once | INTRAVENOUS | Status: AC
  Administered 2015-02-13: 11:00:00 via INTRAVENOUS
  Filled 2015-02-13: qty 5

## 2015-02-13 MED ORDER — PALONOSETRON HCL INJECTION 0.25 MG/5ML
0.2500 mg | Freq: Once | INTRAVENOUS | Status: AC
  Administered 2015-02-13: 0.25 mg via INTRAVENOUS

## 2015-02-13 NOTE — Assessment & Plan Note (Signed)
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 Neo-adjuvant chemotherapy with dose dense Adriamycin and Cytoxan 4 followed by weekly Abraxane and carboplatin 12 2. Followed by surgery 3. Followed by radiation therapy  Current Treatment: completed 4 cycles of adriamycin and Cytoxan, was on Abraxane and carboplatin 8 cycles (carboplatin discontinued for thrombocytopenia from cycle 9) today is cycle 12/12 Abraxane Chemo Toxicities: 1. Constipation: Improved with Miralax 2. alopecia 3. Grade 3 neutropenia with cycle 9 of Abraxane and carboplatin. We gave Neupogen injections on 11/ 7, 11/ 8,11/11and carboplatin was DCed with cycle 9. 4. Anemia due to chemotherapy: monitoring hemoglobin asymptomatic, today's hemoglobin is 9 g 5. Skin and nail discoloration: Due to chemotherapy, especially on the hands 6. Severe fatigue: Unfortunately this is due to chemotherapy and not much can be done to help improve her fatigue. I recommended that she increase her protein intake in the diet. 7. Thrombocytopenia: Reduced the dosage of chemotherapy with cycle 8 in spite of this, her platelet counts did not improve. We discontinued carboplatin from cycle 9. 8. Anticipatory nausea: I added Emend to her regimen with cycle 8, no further episodes of nausea 9. Grade 1 neuropathy: In her toes monitoring closely  Monitoring closely for toxicities  Plan: 1. Breast MRI 02/20/15 2. Tumor board presentation 02/22/15 and follow up after that with me. 3. Appointment with her surgeon to discuss surgical options (patient is not eligible for ALLIANCE trial because of N3 disease)

## 2015-02-13 NOTE — Progress Notes (Signed)
Patient Care Team: Elisabeth Cara, PA-C as PCP - General (Family Medicine) [name], MD as Consulting Physician (General Surgery) Nicholas Lose, MD as Consulting Physician (Hematology and Oncology) Eppie Gibson, MD as Attending Physician (Radiation Oncology) Rockwell Germany, RN as Registered Nurse Mauro Kaufmann, RN as Registered Nurse  DIAGNOSIS: Breast cancer of lower-inner quadrant of right female breast [facility])   Staging form: Breast, AJCC 7th Edition     Clinical stage from 09/07/2014: Stage IIIC (T2, N3, M0) - Unsigned       Staging comments: Staged at breast conference on 6.29.16    SUMMARY OF ONCOLOGIC HISTORY:   Breast cancer of lower-inner quadrant of right female breast ([place])   08/29/2014 Mammogram Right breast mass 5 x 4.8 x 4.2 cm, 6 enlarged axillary lymph nodes including nodes in level III location, T2 N3 M0 stage IIIc clinical stage   08/31/2014 Initial Diagnosis Right breast biopsy 6:00: Invasive ductal carcinoma, right axillary lymph node biopsy high-grade carcinoma ER 0% PR 0% HER-2 negative ratio 1.68, Ki-67 80%, grade 3    Procedure Genetic testing Negative   09/19/2014 -  Neo-Adjuvant Chemotherapy Adriamycin Cytoxan dose dense 4 followed by Abraxane and carboplatin weekly 12    CHIEF COMPLIANT:  Cycle 12 Abraxane  INTERVAL HISTORY: Brandy Douglas is a  44 year old with above-mentioned history right breast cancer currently neoadjuvant chemotherapy. Today his last cycle of chemotherapy with Abraxane. She has tolerated chemotherapy very well. She has grade 1 peripheral neuropathy. Some decline in energy and fatigue related to anemia and chemotherapy.  REVIEW OF SYSTEMS:   Constitutional: Denies fevers, chills or abnormal weight loss Eyes: Denies blurriness of vision Ears, nose, mouth, throat, and face: Denies mucositis or sore throat Respiratory: Denies cough, dyspnea or wheezes Cardiovascular: Denies palpitation, chest discomfort or lower extremity  swelling Gastrointestinal:  Denies nausea, heartburn or change in bowel habits Skin: Denies abnormal skin rashes Lymphatics: Denies new lymphadenopathy or easy bruising Neurological:Denies numbness, tingling or new weaknesses Behavioral/Psych: Mood is stable, no new changes  Breast:  denies any pain or lumps or nodules in either breasts All other systems were reviewed with the patient and are negative.  I have reviewed the past medical history, past surgical history, social history and family history with the patient and they are unchanged from previous note.  ALLERGIES:  has No Known Allergies.  MEDICATIONS:  Current Outpatient Prescriptions  Medication Sig Dispense Refill  . dexamethasone (DECADRON) 4 MG tablet     . HYDROcodone-acetaminophen (NORCO/VICODIN) 5-325 MG per tablet Take 1-2 tablets by mouth every 6 (six) hours as needed. 30 tablet 0  . lidocaine-prilocaine (EMLA) cream Apply to affected area once 30 g 3  . LORazepam (ATIVAN) 0.5 MG tablet Take 1 tablet (0.5 mg total) by mouth at bedtime. 30 tablet 0  . naproxen sodium (RA NAPROXEN SODIUM) 220 MG tablet Take 440 mg by mouth.    . ondansetron (ZOFRAN) 8 MG tablet Take 1 tablet (8 mg total) by mouth 2 (two) times daily as needed. Start on the third day after chemotherapy. 30 tablet 1  . prochlorperazine (COMPAZINE) 10 MG tablet Take 1 tablet (10 mg total) by mouth every 6 (six) hours as needed (Nausea or vomiting). 30 tablet 1   No current facility-administered medications for this visit.    PHYSICAL EXAMINATION: ECOG PERFORMANCE STATUS: 1 - Symptomatic but completely ambulatory  Filed Vitals:   02/13/15 0956  BP: 113/81  Pulse: 87  Temp: 98.1 F (36.7 C)  Resp: 18   Filed Weights   02/13/15 0956  Weight: 172 lb (78.019 kg)    GENERAL:alert, no distress and comfortable SKIN: skin color, texture, turgor are normal, no rashes or significant lesions EYES: normal, Conjunctiva are pink and non-injected, sclera  clear OROPHARYNX:no exudate, no erythema and lips, buccal mucosa, and tongue normal  NECK: supple, thyroid normal size, non-tender, without nodularity LYMPH:  no palpable lymphadenopathy in the cervical, axillary or inguinal LUNGS: clear to auscultation and percussion with normal breathing effort HEART: regular rate & rhythm and no murmurs and no lower extremity edema ABDOMEN:abdomen soft, non-tender and normal bowel sounds Musculoskeletal:no cyanosis of digits and no clubbing  NEURO: alert & oriented x 3 with fluent speech,  Grade 1 peripheral neuropathy   LABORATORY DATA:  I have reviewed the data as listed   Chemistry      Component Value Date/Time   NA 140 02/06/2015 1246   K 4.1 02/06/2015 1246   CO2 25 02/06/2015 1246   BUN 10.6 02/06/2015 1246   CREATININE 0.7 02/06/2015 1246      Component Value Date/Time   CALCIUM 9.5 02/06/2015 1246   ALKPHOS 73 02/06/2015 1246   AST 18 02/06/2015 1246   ALT 10 02/06/2015 1246   BILITOT <0.30 02/06/2015 1246       Lab Results  Component Value Date   WBC 5.1 02/13/2015   HGB 9.3* 02/13/2015   HCT 26.8* 02/13/2015   MCV 100.8 02/13/2015   PLT 341 02/13/2015   NEUTROABS 3.0 02/13/2015   ASSESSMENT & PLAN:  Breast cancer of lower-inner quadrant of right female breast Right breast mass 5 x 4.8 x 4.2 cm, 6 enlarged axillary lymph nodes including nodes in level III location, T2 N3 M0 stage IIIc clinical stage Right breast biopsy 6:00: Invasive ductal carcinoma, 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 Neo-adjuvant chemotherapy with dose dense Adriamycin and Cytoxan 4 followed by weekly Abraxane and carboplatin 12 2. Followed by surgery 3. Followed by radiation therapy  Current Treatment: completed 4 cycles of adriamycin and Cytoxan, was on Abraxane and carboplatin 8 cycles (carboplatin discontinued for thrombocytopenia from cycle 9)  today is cycle 12/12 Abraxane Chemo Toxicities: 1. Constipation: Improved with Miralax 2. alopecia 3. Grade 3 neutropenia with cycle 9 of Abraxane and carboplatin. We gave Neupogen injections on 11/ 7, 11/ 8,11/11and carboplatin was DCed with cycle 9. 4. Anemia due to chemotherapy: monitoring hemoglobin asymptomatic, today's hemoglobin is 9 g 5. Skin and nail discoloration: Due to chemotherapy, especially on the hands 6. Severe fatigue: Unfortunately this is due to chemotherapy and not much can be done to help improve her fatigue. I recommended that she increase her protein intake in the diet. 7. Thrombocytopenia: Reduced the dosage of chemotherapy with cycle 8 in spite of this, her platelet counts did not improve. We discontinued carboplatin from cycle 9. 8. Anticipatory nausea: I added Emend to her regimen with cycle 8, no further episodes of nausea 9. Grade 1 neuropathy: In her toes monitoring closely Monitoring closely for toxicities  Plan: 1. Breast MRI 02/20/15 2. Tumor board presentation 02/22/15 and follow up after that with me. 3. Appointment with her surgeon to discuss surgical options (patient is not eligible for ALLIANCE trial because of N3 disease)  No orders of the defined types were placed in this encounter.   The patient has a good understanding of the overall plan. she  agrees with it. she will call with any problems that may develop before the next visit here.   Rulon Eisenmenger, MD 02/13/2015

## 2015-02-13 NOTE — Telephone Encounter (Signed)
Appointments made and avs printed for pateint °

## 2015-02-13 NOTE — Patient Instructions (Signed)

## 2015-02-13 NOTE — Addendum Note (Signed)
Addended by: Prentiss Bells on: 02/13/2015 06:56 PM   Modules accepted: Medications

## 2015-02-13 NOTE — Patient Instructions (Signed)
[facility] Discharge Instructions for Patients Receiving Chemotherapy  Today you received the following chemotherapy agents: Abraxane   To help prevent nausea and vomiting after your treatment, we encourage you to take your nausea medication as directed.    If you develop nausea and vomiting that is not controlled by your nausea medication, call the clinic.   BELOW ARE SYMPTOMS THAT SHOULD BE REPORTED IMMEDIATELY:  *FEVER GREATER THAN 100.5 F  *CHILLS WITH OR WITHOUT FEVER  NAUSEA AND VOMITING THAT IS NOT CONTROLLED WITH YOUR NAUSEA MEDICATION  *UNUSUAL SHORTNESS OF BREATH  *UNUSUAL BRUISING OR BLEEDING  TENDERNESS IN MOUTH AND THROAT WITH OR WITHOUT PRESENCE OF ULCERS  *URINARY PROBLEMS  *BOWEL PROBLEMS  UNUSUAL RASH Items with * indicate a potential emergency and should be followed up as soon as possible.  Feel free to call the clinic you have any questions or concerns. The clinic phone number is [phone].  Please show the CHEMO ALERT CARD at check-in to the Emergency Department and triage nurse.   

## 2015-02-13 NOTE — Progress Notes (Signed)
Pt's CMET hemolyzed; per Dr. Lindi Adie, okay to proceed without CMET. New CMET drawn in infusion by Ulice Bold

## 2015-02-15 ENCOUNTER — Encounter: Payer: Self-pay | Admitting: *Deleted

## 2015-02-20 ENCOUNTER — Ambulatory Visit
Admission: RE | Admit: 2015-02-20 | Discharge: 2015-02-20 | Disposition: A | Discharge status: Home / Self Care | Payer: 59 | Source: Ambulatory Visit | Attending: Hematology and Oncology | Admitting: Hematology and Oncology

## 2015-02-20 DIAGNOSIS — C50311 Malignant neoplasm of lower-inner quadrant of right female breast: Secondary | ICD-10-CM

## 2015-02-20 MED ORDER — GADOBENATE DIMEGLUMINE 529 MG/ML IV SOLN
15.0000 mL | Freq: Once | INTRAVENOUS | Status: AC | PRN
  Administered 2015-02-20: 15 mL via INTRAVENOUS

## 2015-02-21 NOTE — Assessment & Plan Note (Addendum)
Right breast mass 5 x 4.8 x 4.2 cm, 6 enlarged axillary lymph nodes including nodes in level III location, T2 N3 M0 stage IIIc clinical stage Right breast biopsy 6:00: Invasive ductal carcinoma, right axillary lymph node biopsy high-grade carcinoma ER 0% PR 0% HER-2 negative ratio 1.68, Ki-67 80%, grade 3 Neo-adjuvant chemotherapy with dose dense Adriamycin and Cytoxan 4 followed by weekly Abraxane and carboplatin 12 (started 09/19/2014 and completed 02/13/2015) (carboplatin discontinued for thrombocytopenia from cycle 9) Breast MRI 02/20/2015: Complete radiologic response ---------------------------------------------------------------------------------------------------------------------------------------------------------- Treatment Plan:  1. Breast conserving surgery with axillary lymph node dissection was recommended by tumor board 2. Followed by radiation therapy  Summary of chemotherapy toxicities: Constipation, alopecia, grade 3 neutropenia, anemia, skin and nail discoloration, severe fatigue, thrombocytopenia, anticipatory nausea, grade 1 peripheral neuropathy  Return to clinic after surgery to discuss final pathology report. Return to clinic after surgery

## 2015-02-22 ENCOUNTER — Encounter: Payer: Self-pay | Admitting: Hematology and Oncology

## 2015-02-22 ENCOUNTER — Ambulatory Visit (HOSPITAL_BASED_OUTPATIENT_CLINIC_OR_DEPARTMENT_OTHER): Payer: 59 | Admitting: Hematology and Oncology

## 2015-02-22 VITALS — BP 116/76 | HR 89 | Temp 98.1°F | Resp 18 | Ht 67.0 in | Wt 172.4 lb

## 2015-02-22 DIAGNOSIS — R530 Neoplastic (malignant) related fatigue: Secondary | ICD-10-CM

## 2015-02-22 DIAGNOSIS — T451X5A Adverse effect of antineoplastic and immunosuppressive drugs, initial encounter: Secondary | ICD-10-CM

## 2015-02-22 DIAGNOSIS — C773 Secondary and unspecified malignant neoplasm of axilla and upper limb lymph nodes: Secondary | ICD-10-CM

## 2015-02-22 DIAGNOSIS — Z171 Estrogen receptor negative status [ER-]: Secondary | ICD-10-CM

## 2015-02-22 DIAGNOSIS — C50311 Malignant neoplasm of lower-inner quadrant of right female breast: Secondary | ICD-10-CM

## 2015-02-22 DIAGNOSIS — G620 Drug-induced polyneuropathy: Secondary | ICD-10-CM

## 2015-02-22 DIAGNOSIS — D6481 Anemia due to antineoplastic chemotherapy: Secondary | ICD-10-CM

## 2015-02-22 NOTE — Progress Notes (Signed)
 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)   Staging form: Breast, AJCC 7th Edition     Clinical stage from 09/07/2014: Stage IIIC (T2, N3, M0) - Unsigned       Staging comments: Staged at breast conference on 6.29.16  SUMMARY OF ONCOLOGIC HISTORY:   Breast cancer of lower-inner quadrant of right female breast (HCC)   08/29/2014 Mammogram Right breast mass 5 x 4.8 x 4.2 cm, 6 enlarged axillary lymph nodes including nodes in level III location, T2 N3 M0 stage IIIc clinical stage   08/31/2014 Initial Diagnosis Right breast biopsy 6:00: Invasive ductal carcinoma, right axillary lymph node biopsy high-grade carcinoma ER 0% PR 0% HER-2 negative ratio 1.68, Ki-67 80%, grade 3    Procedure Genetic testing Negative   09/19/2014 - 02/13/2015 Neo-Adjuvant Chemotherapy Adriamycin Cytoxan dose dense 4 followed by Abraxane and carboplatin weekly 12   02/20/2015 Breast MRI Complete radiologic response   CHIEF COMPLIANT: Follow-up after neoadjuvant chemotherapy breast MRI  INTERVAL HISTORY: Brandy Douglas is a 44-year-old with above-mentioned history of right breast cancer who received neoadjuvant chemotherapy and underwent a breast MRI. She is here today to discuss the results of the MRI. We discussed her case in the multidisciplinary tumor board. She feels mildly tired and has mild neuropathy but otherwise doing quite well.  REVIEW OF SYSTEMS:   Constitutional: Denies fevers, chills or abnormal weight loss Eyes: Denies blurriness of vision Ears, nose, mouth, throat, and face: Denies mucositis or sore throat Respiratory:  Denies cough, dyspnea or wheezes Cardiovascular: Denies palpitation, chest discomfort or lower extremity swelling Gastrointestinal:  Denies nausea, heartburn or change in bowel habits Skin: Denies abnormal skin rashes Lymphatics: Denies new lymphadenopathy or easy bruising Neurological: Tingling and numbness of the tips of the fingers and toes Behavioral/Psych: Mood is stable, no new changes  Breast:  denies any pain or lumps or nodules in either breasts All other systems were reviewed with the patient and are negative.  I have reviewed the past medical history, past surgical history, social history and family history with the patient and they are unchanged from previous note.  ALLERGIES:  has No Known Allergies.  MEDICATIONS:  Current Outpatient Prescriptions  Medication Sig Dispense Refill  . dexamethasone (DECADRON) 4 MG tablet     . HYDROcodone-acetaminophen (NORCO/VICODIN) 5-325 MG per tablet Take 1-2 tablets by mouth every 6 (six) hours as needed. 30 tablet 0  . lidocaine-prilocaine (EMLA) cream Apply to affected area once 30 g 3  . LORazepam (ATIVAN) 0.5 MG tablet Take 1 tablet (0.5 mg total) by mouth at bedtime. 30 tablet 0  . naproxen sodium (RA NAPROXEN SODIUM) 220 MG tablet Take 440 mg by mouth.    . ondansetron (ZOFRAN) 8 MG tablet Take 1 tablet (8 mg total) by mouth 2 (two) times daily as needed. Start on the third day after chemotherapy. 30 tablet 1  . prochlorperazine (COMPAZINE) 10 MG tablet Take 1 tablet (10 mg total) by mouth every 6 (six) hours as needed (Nausea or vomiting). 30 tablet 1   No current facility-administered medications for this visit.    PHYSICAL EXAMINATION: ECOG PERFORMANCE STATUS: 1 - Symptomatic but completely   ambulatory  Filed Vitals:   02/22/15 0845  BP: 116/76  Pulse: 89  Temp: 98.1 F (36.7 C)  Resp: 18   Filed Weights   02/22/15 0845  Weight: 172 lb 6.4 oz (78.2 kg)    GENERAL:alert, no distress and comfortable SKIN: skin color,  texture, turgor are normal, no rashes or significant lesions EYES: normal, Conjunctiva are pink and non-injected, sclera clear OROPHARYNX:no exudate, no erythema and lips, buccal mucosa, and tongue normal  NECK: supple, thyroid normal size, non-tender, without nodularity LYMPH:  no palpable lymphadenopathy in the cervical, axillary or inguinal LUNGS: clear to auscultation and percussion with normal breathing effort HEART: regular rate & rhythm and no murmurs and no lower extremity edema ABDOMEN:abdomen soft, non-tender and normal bowel sounds Musculoskeletal:no cyanosis of digits and no clubbing  NEURO: alert & oriented x 3 with fluent speech, grade 1 peripheral neuropathy LABORATORY DATA:  I have reviewed the data as listed   Chemistry      Component Value Date/Time   NA 139 02/13/2015 0847   K 4.2 02/13/2015 0847   CO2 24 02/13/2015 0847   BUN 7.8 02/13/2015 0847   CREATININE 0.7 02/13/2015 0847      Component Value Date/Time   CALCIUM 9.4 02/13/2015 0847   ALKPHOS 71 02/13/2015 0847   AST 16 02/13/2015 0847   ALT 9 02/13/2015 0847   BILITOT <0.30 02/13/2015 0847       Lab Results  Component Value Date   WBC 5.1 02/13/2015   HGB 9.3* 02/13/2015   HCT 26.8* 02/13/2015   MCV 100.8 02/13/2015   PLT 341 02/13/2015   NEUTROABS 3.0 02/13/2015     RADIOGRAPHIC STUDIES: I have personally reviewed the radiology reports and agreed with their findings. No results found.   ASSESSMENT & PLAN:  Breast cancer of lower-inner quadrant of right female breast Right breast mass 5 x 4.8 x 4.2 cm, 6 enlarged axillary lymph nodes including nodes in level III location, T2 N3 M0 stage IIIc clinical stage Right breast biopsy 6:00: Invasive ductal carcinoma, right axillary lymph node biopsy high-grade carcinoma ER 0% PR 0% HER-2 negative ratio 1.68, Ki-67 80%, grade 3 Neo-adjuvant chemotherapy with dose dense Adriamycin and Cytoxan 4 followed by weekly Abraxane and carboplatin 12 (started  09/19/2014 and completed 02/13/2015) (carboplatin discontinued for thrombocytopenia from cycle 9) Breast MRI 02/20/2015: Complete radiologic response ---------------------------------------------------------------------------------------------------------------------------------------------------------- Treatment Plan:  1. Breast conserving surgery with axillary lymph node dissection was recommended by tumor board 2. Followed by radiation therapy  Summary of chemotherapy toxicities: Constipation, alopecia, grade 3 neutropenia, anemia, skin and nail discoloration, severe fatigue, thrombocytopenia, anticipatory nausea, grade 1 peripheral neuropathy Chemotherapy-induced peripheral neuropathy: Will be watched and monitored  Return to clinic after surgery to discuss final pathology report. Return to clinic after surgery  No orders of the defined types were placed in this encounter.   The patient has a good understanding of the overall plan. she agrees with it. she will call with any problems that may develop before the next visit here.   Rulon Eisenmenger, MD 02/22/2015

## 2015-02-22 NOTE — Addendum Note (Signed)
Addended by: Jonelle Sports K on: 02/22/2015 11:51 AM   Modules accepted: Medications

## 2015-02-23 ENCOUNTER — Other Ambulatory Visit: Payer: Self-pay | Admitting: Surgery

## 2015-02-23 DIAGNOSIS — C50911 Malignant neoplasm of unspecified site of right female breast: Secondary | ICD-10-CM

## 2015-03-01 ENCOUNTER — Encounter: Payer: Self-pay | Admitting: *Deleted

## 2015-03-01 NOTE — Progress Notes (Signed)
[facility]  Clinical Social Work was referred by patient for assessment of psychosocial needs due to financial concerns.  Clinical Social Worker contacted patient at home to offer support and assess for needs. Pt has already started application for [employer] in [place] and plans to look into the [employer] as well. CSW discussed other resources for additional support as she gets ready for surgery. Pt agrees to reach out to CSW once applications have been received.   Clinical Social Work interventions: Resource education  Loren Racer, [place] Worker [place]  [place] Phone: [phone] Fax: [phone]

## 2015-03-10 ENCOUNTER — Encounter: Payer: Self-pay | Admitting: Hematology and Oncology

## 2015-03-10 NOTE — Progress Notes (Signed)
I placed cancer care and [employer] forms for dr. Lindi Adie and noted sharepoint.

## 2015-03-12 HISTORY — PX: BREAST LUMPECTOMY: SHX2

## 2015-03-14 ENCOUNTER — Encounter: Payer: Self-pay | Admitting: Hematology and Oncology

## 2015-03-14 NOTE — Progress Notes (Signed)
Patient called to see if applications had been sent off to [employer] in [place]. According to notes they were placed for Dr.Gudena on 12/30. Please contact pt once faxed.

## 2015-03-15 ENCOUNTER — Encounter: Payer: Self-pay | Admitting: *Deleted

## 2015-03-16 ENCOUNTER — Encounter: Payer: Self-pay | Admitting: Hematology and Oncology

## 2015-03-16 NOTE — Progress Notes (Signed)
I mailed original cancer care form/notes. Terri-the nurse faxed all forms/notes. I noted sharepoint.

## 2015-03-16 NOTE — Progress Notes (Signed)
I mailed [employer] originals to patient also. Brandy Douglas faxed those with labs to them

## 2015-03-31 ENCOUNTER — Telehealth: Payer: Self-pay | Admitting: *Deleted

## 2015-03-31 NOTE — Telephone Encounter (Signed)
Call received in [place] from pt stating " could I get a referral to another surgeon "  This RN inquired further with pt per above with pt stating she has completed neoadjuvant chemo and was scheduled for surgery by Dr Lucia Gaskins for 03/16/2015 " but then they ( [facility] ) a few days before and told me I needed to pay $1400 prior to the surgery "  Pt states she is obtaining funds from " [employer] " - " but they are asking me why is the surgeon's office requiring me to pay the money first "  Brandy Douglas does not have any concerns relating to Dr Lucia Gaskins " I just don't understand why his office is demanding this "  Per review - pt completed chemo 02/13/2015 and was seen by Dr Lindi Adie on 02/22/2015 and released for surgery.  Surgery was scheduled for 03/16/2015 which was cancelled due to pt not having funds available.  This RN validated pt's concerns and informed her above would be given to MD and Breast Cancer Nurse Navigators for appropriate follow up.  Pt understands due to time of day she may not receive a return call until Monday.  Return call number given for pt as [phone].  This message will be forwarded to MD- including Dr Lucia Gaskins and RN including navigators.

## 2015-04-21 ENCOUNTER — Other Ambulatory Visit: Payer: Self-pay | Admitting: Surgery

## 2015-04-21 ENCOUNTER — Encounter (HOSPITAL_BASED_OUTPATIENT_CLINIC_OR_DEPARTMENT_OTHER): Payer: Self-pay | Admitting: *Deleted

## 2015-04-21 DIAGNOSIS — R928 Other abnormal and inconclusive findings on diagnostic imaging of breast: Secondary | ICD-10-CM

## 2015-04-24 ENCOUNTER — Encounter: Payer: Self-pay | Admitting: *Deleted

## 2015-04-25 ENCOUNTER — Encounter (HOSPITAL_BASED_OUTPATIENT_CLINIC_OR_DEPARTMENT_OTHER)
Admission: RE | Admit: 2015-04-25 | Discharge: 2015-04-25 | Disposition: A | Discharge status: Home / Self Care | Payer: 59 | Source: Ambulatory Visit | Attending: Surgery | Admitting: Surgery

## 2015-04-25 ENCOUNTER — Telehealth: Payer: Self-pay | Admitting: Hematology and Oncology

## 2015-04-25 DIAGNOSIS — Z01812 Encounter for preprocedural laboratory examination: Secondary | ICD-10-CM | POA: Insufficient documentation

## 2015-04-25 DIAGNOSIS — C50911 Malignant neoplasm of unspecified site of right female breast: Secondary | ICD-10-CM | POA: Diagnosis present

## 2015-04-25 LAB — COMPREHENSIVE METABOLIC PANEL
ALK PHOS: 69 U/L (ref 38–126)
ALT: 16 U/L (ref 14–54)
AST: 19 U/L (ref 15–41)
Albumin: 3.3 g/dL — ABNORMAL LOW (ref 3.5–5.0)
Anion gap: 11 (ref 5–15)
BILIRUBIN TOTAL: 0.6 mg/dL (ref 0.3–1.2)
BUN: 12 mg/dL (ref 6–20)
CALCIUM: 9.8 mg/dL (ref 8.9–10.3)
CO2: 26 mmol/L (ref 22–32)
CREATININE: 0.73 mg/dL (ref 0.44–1.00)
Chloride: 105 mmol/L (ref 101–111)
Glucose, Bld: 101 mg/dL — ABNORMAL HIGH (ref 65–99)
Potassium: 4.3 mmol/L (ref 3.5–5.1)
SODIUM: 142 mmol/L (ref 135–145)
TOTAL PROTEIN: 7.9 g/dL (ref 6.5–8.1)

## 2015-04-25 LAB — CBC WITH DIFFERENTIAL/PLATELET
Basophils Absolute: 0 10*3/uL (ref 0.0–0.1)
Basophils Relative: 0 %
Eosinophils Absolute: 0.1 10*3/uL (ref 0.0–0.7)
Eosinophils Relative: 1 %
HEMATOCRIT: 33 % — AB (ref 36.0–46.0)
HEMOGLOBIN: 10.6 g/dL — AB (ref 12.0–15.0)
LYMPHS ABS: 2 10*3/uL (ref 0.7–4.0)
LYMPHS PCT: 32 %
MCH: 29.9 pg (ref 26.0–34.0)
MCHC: 32.1 g/dL (ref 30.0–36.0)
MCV: 93.2 fL (ref 78.0–100.0)
MONOS PCT: 8 %
Monocytes Absolute: 0.5 10*3/uL (ref 0.1–1.0)
NEUTROS ABS: 3.7 10*3/uL (ref 1.7–7.7)
NEUTROS PCT: 59 %
Platelets: 288 10*3/uL (ref 150–400)
RBC: 3.54 MIL/uL — AB (ref 3.87–5.11)
RDW: 14.6 % (ref 11.5–15.5)
WBC: 6.2 10*3/uL (ref 4.0–10.5)

## 2015-04-25 NOTE — Telephone Encounter (Signed)
Left message for patient for appointment date/time for 2/27 per MD 2/13 pof

## 2015-04-26 ENCOUNTER — Ambulatory Visit
Admission: RE | Admit: 2015-04-26 | Discharge: 2015-04-26 | Disposition: A | Discharge status: Home / Self Care | Payer: 59 | Source: Ambulatory Visit | Attending: Surgery | Admitting: Surgery

## 2015-04-26 DIAGNOSIS — C50911 Malignant neoplasm of unspecified site of right female breast: Secondary | ICD-10-CM

## 2015-04-30 NOTE — H&P (Signed)
Brandy Douglas. [facility]  Location: [facility] Surgery Patient #: [id number] DOB: December 30, 1970 Single / Language: Undefined / Race: Undefined Female  History of Present Illness   The patient is a 45 year old female who presents with breast cancer.   Her PCP is Brandy Douglas.  She presented t the Breast [place] - Oncology - Brandy Douglas and Isidore Moos.  She comes by herself.   She has completed chemotherapy on 02/13/2015. She has done well. Is having no trouble. She saw Dr. Lindi Douglas yesterday.  We reviewed the next step. I think that she is a candidate for lumpectomy. But I also think she is best served with axillary dissection.  We will schedule this in early 2017. Plan: 1) right breast lumpectomy (seed loc) and axillary node dissection, and remove port  History of breast cancer: Brandy Douglas was taking a shower when she felt a mass in her right breast. She had no prior history of breast biopsies, but she said that her right breast has always been "heavy". She had a mammogram at the [facility] on 08/29/2014 that showed a 5.0 cm mass at the 5 o'clock position of the right breast. She also has some abdnormal axillary lymph nodes. There is mention of level 3 lymph nodes. There is an MRI which is pending. But she is starting with N3 nodes. This would kick her out of the [facility] trial. She had a biopsy on 08/31/2014 - [id number] - that shows Grade 3, Triple negative IDC, Ki67 - 80%.  Lymph node biopsy with high grade carcinoma. She is not on hormones. Her last period was 22-Aug-2014. Her father's mother died of breast cancer - maybe in her 28's.  I discussed the options for breast cancer treatment with the patient. She is in the Breast multidisciplinary clinic. I discussed the surgical options of lumpectomy vs. mastectomy. If mastectomy, there is the possibility of reconstruction. I discussed the options of lymph node biopsy. The treatment plan depends on the  pathologic staging of the tumor and the patient's personal wishes. The risks of surgery include, but are not limited to, bleeding, infection, the need for further surgery, and nerve injury.  Past Medical History: 1. Cholecystectomy in the 1990's. 2. Power port insert - 09/16/2014 - D. [employer]  Social History: Unmarried. Lives with boyfriend, Brandy Douglas. He did not come with her today. Her mother and sister were sick today. She has 3 children: 24 son, 36 daughter, and 11 yo Corporate investment banker. But it sounds like none of them live with her. She works at Press photographer at [employer].    Other Problems Brandy Medal, MD; 02/23/2015 9:00 AM) Breast Cancer  Past Surgical History Brandy Medal, MD; 02/23/2015 9:00 AM) Breast Biopsy Right. Oral Surgery  Allergies (Brandy Eversole, LPN; 03/19/3233 5:73 AM) No Known Drug Allergies12/15/2016  Medication History (Brandy Eversole, LPN; 22/04/5425 0:62 AM) Hydrocodone-Acetaminophen (5-'325MG'$  Tablet, Oral) Active. Medications Reconciled  Vitals (Brandy Eversole LPN; 37/62/8315 1:76 AM) 02/23/2015 8:40 AM Weight: 172.8 lb Height: 67in Body Surface Area: 1.9 m Body Mass Index: 27.06 kg/m  Temp.: 98.66F(Oral)  Pulse: 82 (Regular)  BP: 112/70 (Sitting, Left Arm, Standard)   Physical Exam  General: WN AA F alert and generally healthy appearing. HEENT: Normal. Pupils equal.  Neck: Supple. No mass. No thyroid mass.  Lymph Nodes: No supraclavicular or cervical nodes. Right axillayr adenopathy better.  Lungs: Clear to auscultation and symmetric breath sounds. Heart: RRR. No murmur or rub.  Breasts: Right - no palpable mass. What masses I felt before  are gone. Left - no mass, power port in upper inner left breast  Extremities: Good strength and ROM in upper and lower extremities.  Assessment & Plan  1.  BREAST CANCER, STAGE 3, RIGHT (C50.911)  Story: Mammogram at the [facility] on 08/29/2014 that showed a 5.0 cm  mass at the 5 o'clock position of the right breast. She also has some abdnormal axillary lymph node.  She had a biopsy on 08/31/2014 - [id number] - that shows Grade 3, Triple negative IDC, Ki67 - 80%.  Lymph node biopsy with high grade carcinoma.   N2, T3 right breast cancer   MRI on 02/20/2015 shows complete resolution of tumor.   Oncology - Brandy Douglas and May.  Plan: 1) right breast lumpectomy (seed placed 04/26/2015) and right axillary node dissection and remove power port [There was some delay because of finances - I'm not sure that they are resolved, but plan to go ahead with surgery.]  2.  Cholecystectomy in the 1990's.   [name], MD, [facility] Surgery Pager: [phone] Office phone:  [phone]

## 2015-05-01 ENCOUNTER — Ambulatory Visit (HOSPITAL_BASED_OUTPATIENT_CLINIC_OR_DEPARTMENT_OTHER): Payer: 59 | Admitting: Anesthesiology

## 2015-05-01 ENCOUNTER — Ambulatory Visit (HOSPITAL_BASED_OUTPATIENT_CLINIC_OR_DEPARTMENT_OTHER)
Admission: RE | Admit: 2015-05-01 | Discharge: 2015-05-01 | Disposition: A | Discharge status: Home / Self Care | Payer: 59 | Source: Ambulatory Visit | Attending: Surgery | Admitting: Surgery

## 2015-05-01 ENCOUNTER — Ambulatory Visit
Admission: RE | Admit: 2015-05-01 | Discharge: 2015-05-01 | Disposition: A | Discharge status: Home / Self Care | Payer: 59 | Source: Ambulatory Visit | Attending: Surgery | Admitting: Surgery

## 2015-05-01 ENCOUNTER — Encounter (HOSPITAL_BASED_OUTPATIENT_CLINIC_OR_DEPARTMENT_OTHER)
Admission: RE | Disposition: A | Discharge status: Home / Self Care | Payer: Self-pay | Source: Ambulatory Visit | Attending: Surgery

## 2015-05-01 ENCOUNTER — Encounter (HOSPITAL_BASED_OUTPATIENT_CLINIC_OR_DEPARTMENT_OTHER): Payer: Self-pay | Admitting: *Deleted

## 2015-05-01 DIAGNOSIS — R928 Other abnormal and inconclusive findings on diagnostic imaging of breast: Secondary | ICD-10-CM

## 2015-05-01 DIAGNOSIS — Z171 Estrogen receptor negative status [ER-]: Secondary | ICD-10-CM | POA: Insufficient documentation

## 2015-05-01 DIAGNOSIS — C773 Secondary and unspecified malignant neoplasm of axilla and upper limb lymph nodes: Secondary | ICD-10-CM | POA: Insufficient documentation

## 2015-05-01 DIAGNOSIS — Z9049 Acquired absence of other specified parts of digestive tract: Secondary | ICD-10-CM | POA: Diagnosis not present

## 2015-05-01 DIAGNOSIS — D649 Anemia, unspecified: Secondary | ICD-10-CM | POA: Insufficient documentation

## 2015-05-01 DIAGNOSIS — C50911 Malignant neoplasm of unspecified site of right female breast: Secondary | ICD-10-CM | POA: Diagnosis not present

## 2015-05-01 HISTORY — PX: PORT-A-CATH REMOVAL: SHX5289

## 2015-05-01 HISTORY — PX: BREAST LUMPECTOMY WITH RADIOACTIVE SEED AND AXILLARY LYMPH NODE DISSECTION: SHX6656

## 2015-05-01 HISTORY — DX: Anxiety disorder, unspecified: F41.9

## 2015-05-01 SURGERY — BREAST LUMPECTOMY WITH RADIOACTIVE SEED AND AXILLARY LYMPH NODE DISSECTION
Anesthesia: General | Site: Chest | Laterality: Right

## 2015-05-01 MED ORDER — ATROPINE SULFATE 0.4 MG/ML IJ SOLN
INTRAMUSCULAR | Status: AC
  Filled 2015-05-01: qty 1

## 2015-05-01 MED ORDER — MIDAZOLAM HCL 2 MG/2ML IJ SOLN
INTRAMUSCULAR | Status: AC
  Filled 2015-05-01: qty 2

## 2015-05-01 MED ORDER — PHENYLEPHRINE HCL 10 MG/ML IJ SOLN
INTRAMUSCULAR | Status: DC | PRN
  Administered 2015-05-01: 40 ug via INTRAVENOUS

## 2015-05-01 MED ORDER — BUPIVACAINE-EPINEPHRINE (PF) 0.25% -1:200000 IJ SOLN
INTRAMUSCULAR | Status: DC | PRN
  Administered 2015-05-01: 25 mL

## 2015-05-01 MED ORDER — SODIUM BICARBONATE 4 % IV SOLN
INTRAVENOUS | Status: AC
Start: 2015-05-01 — End: 2015-05-01
  Filled 2015-05-01: qty 5

## 2015-05-01 MED ORDER — ONDANSETRON HCL 4 MG/2ML IJ SOLN
INTRAMUSCULAR | Status: AC
  Filled 2015-05-01: qty 2

## 2015-05-01 MED ORDER — OXYCODONE HCL 5 MG PO TABS
5.0000 mg | ORAL_TABLET | Freq: Once | ORAL | Status: AC | PRN
  Administered 2015-05-01: 5 mg via ORAL

## 2015-05-01 MED ORDER — SODIUM CHLORIDE 0.9 % IJ SOLN
INTRAMUSCULAR | Status: AC
  Filled 2015-05-01: qty 10

## 2015-05-01 MED ORDER — HYDROCODONE-ACETAMINOPHEN 5-325 MG PO TABS: 1.0000 | ORAL_TABLET | Freq: Four times a day (QID) | ORAL | Status: DC | PRN

## 2015-05-01 MED ORDER — FENTANYL CITRATE (PF) 100 MCG/2ML IJ SOLN
INTRAMUSCULAR | Status: AC
  Filled 2015-05-01: qty 2

## 2015-05-01 MED ORDER — DEXAMETHASONE SODIUM PHOSPHATE 4 MG/ML IJ SOLN
INTRAMUSCULAR | Status: DC | PRN
  Administered 2015-05-01: 10 mg via INTRAVENOUS

## 2015-05-01 MED ORDER — CEFAZOLIN SODIUM-DEXTROSE 2-3 GM-% IV SOLR
INTRAVENOUS | Status: AC
  Filled 2015-05-01: qty 50

## 2015-05-01 MED ORDER — OXYCODONE HCL 5 MG/5ML PO SOLN: 5.0000 mg | Freq: Once | ORAL | Status: AC | PRN

## 2015-05-01 MED ORDER — FENTANYL CITRATE (PF) 100 MCG/2ML IJ SOLN
25.0000 ug | INTRAMUSCULAR | Status: DC | PRN
  Administered 2015-05-01 (×2): 25 ug via INTRAVENOUS

## 2015-05-01 MED ORDER — OXYCODONE HCL 5 MG PO TABS
ORAL_TABLET | ORAL | Status: AC
  Filled 2015-05-01: qty 1

## 2015-05-01 MED ORDER — SUFENTANIL CITRATE 50 MCG/ML IV SOLN
INTRAVENOUS | Status: DC | PRN
  Administered 2015-05-01: 30 ug via INTRAVENOUS

## 2015-05-01 MED ORDER — LIDOCAINE HCL (CARDIAC) 20 MG/ML IV SOLN
INTRAVENOUS | Status: DC | PRN
  Administered 2015-05-01: 80 mg via INTRAVENOUS

## 2015-05-01 MED ORDER — ONDANSETRON HCL 4 MG/2ML IJ SOLN
INTRAMUSCULAR | Status: DC | PRN
  Administered 2015-05-01: 4 mg via INTRAVENOUS

## 2015-05-01 MED ORDER — 0.9 % SODIUM CHLORIDE (POUR BTL) OPTIME
TOPICAL | Status: DC | PRN
  Administered 2015-05-01: 800 mL

## 2015-05-01 MED ORDER — SUCCINYLCHOLINE CHLORIDE 20 MG/ML IJ SOLN
INTRAMUSCULAR | Status: AC
  Filled 2015-05-01: qty 1

## 2015-05-01 MED ORDER — CHLORHEXIDINE GLUCONATE 4 % EX LIQD: 1.0000 "application " | Freq: Once | CUTANEOUS | Status: DC

## 2015-05-01 MED ORDER — SUFENTANIL CITRATE 50 MCG/ML IV SOLN
INTRAVENOUS | Status: AC
  Filled 2015-05-01: qty 1

## 2015-05-01 MED ORDER — GLYCOPYRROLATE 0.2 MG/ML IJ SOLN
INTRAMUSCULAR | Status: AC
  Filled 2015-05-01: qty 1

## 2015-05-01 MED ORDER — GLYCOPYRROLATE 0.2 MG/ML IJ SOLN: 0.2000 mg | Freq: Once | INTRAMUSCULAR | Status: DC | PRN

## 2015-05-01 MED ORDER — DEXAMETHASONE SODIUM PHOSPHATE 10 MG/ML IJ SOLN
INTRAMUSCULAR | Status: AC
  Filled 2015-05-01: qty 1

## 2015-05-01 MED ORDER — LIDOCAINE HCL (CARDIAC) 20 MG/ML IV SOLN
INTRAVENOUS | Status: AC
  Filled 2015-05-01: qty 5

## 2015-05-01 MED ORDER — SCOPOLAMINE 1 MG/3DAYS TD PT72
1.0000 | MEDICATED_PATCH | Freq: Once | TRANSDERMAL | Status: DC | PRN
Start: 2015-05-01 — End: 2015-05-01

## 2015-05-01 MED ORDER — PROPOFOL 500 MG/50ML IV EMUL
INTRAVENOUS | Status: AC
  Filled 2015-05-01: qty 50

## 2015-05-01 MED ORDER — MIDAZOLAM HCL 2 MG/2ML IJ SOLN
1.0000 mg | INTRAMUSCULAR | Status: DC | PRN
  Administered 2015-05-01: 2 mg via INTRAVENOUS
  Administered 2015-05-01: 1 mg via INTRAVENOUS

## 2015-05-01 MED ORDER — EPHEDRINE SULFATE 50 MG/ML IJ SOLN
INTRAMUSCULAR | Status: AC
  Filled 2015-05-01: qty 1

## 2015-05-01 MED ORDER — METOCLOPRAMIDE HCL 5 MG/ML IJ SOLN: 10.0000 mg | Freq: Once | INTRAMUSCULAR | Status: DC | PRN

## 2015-05-01 MED ORDER — LIDOCAINE-EPINEPHRINE (PF) 1 %-1:200000 IJ SOLN
INTRAMUSCULAR | Status: AC
  Filled 2015-05-01: qty 30

## 2015-05-01 MED ORDER — FENTANYL CITRATE (PF) 100 MCG/2ML IJ SOLN
50.0000 ug | INTRAMUSCULAR | Status: DC | PRN
  Administered 2015-05-01: 100 ug via INTRAVENOUS

## 2015-05-01 MED ORDER — BUPIVACAINE-EPINEPHRINE (PF) 0.25% -1:200000 IJ SOLN
INTRAMUSCULAR | Status: AC
  Filled 2015-05-01: qty 30

## 2015-05-01 MED ORDER — MEPERIDINE HCL 25 MG/ML IJ SOLN: 6.2500 mg | INTRAMUSCULAR | Status: DC | PRN

## 2015-05-01 MED ORDER — PHENYLEPHRINE 40 MCG/ML (10ML) SYRINGE FOR IV PUSH (FOR BLOOD PRESSURE SUPPORT)
PREFILLED_SYRINGE | INTRAVENOUS | Status: AC
  Filled 2015-05-01: qty 10

## 2015-05-01 MED ORDER — LACTATED RINGERS IV SOLN
INTRAVENOUS | Status: DC
  Administered 2015-05-01: 10 mL/h via INTRAVENOUS
  Administered 2015-05-01: 09:00:00 via INTRAVENOUS

## 2015-05-01 MED ORDER — BUPIVACAINE-EPINEPHRINE (PF) 0.5% -1:200000 IJ SOLN
INTRAMUSCULAR | Status: DC | PRN
  Administered 2015-05-01: 30 mL via PERINEURAL

## 2015-05-01 MED ORDER — CEFAZOLIN SODIUM-DEXTROSE 2-3 GM-% IV SOLR
2.0000 g | INTRAVENOUS | Status: AC
  Administered 2015-05-01: 2 g via INTRAVENOUS

## 2015-05-01 SURGICAL SUPPLY — 76 items
APL SKNCLS STERI-STRIP NONHPOA (GAUZE/BANDAGES/DRESSINGS)
APPLIER CLIP 11 MED OPEN (CLIP) ×5
APR CLP MED 11 20 MLT OPN (CLIP) ×3
BENZOIN TINCTURE PRP APPL 2/3 (GAUZE/BANDAGES/DRESSINGS) IMPLANT
BINDER BREAST LRG (GAUZE/BANDAGES/DRESSINGS) IMPLANT
BINDER BREAST MEDIUM (GAUZE/BANDAGES/DRESSINGS) IMPLANT
BINDER BREAST XLRG (GAUZE/BANDAGES/DRESSINGS) ×3 IMPLANT
BINDER BREAST XXLRG (GAUZE/BANDAGES/DRESSINGS) IMPLANT
BLADE SURG 10 STRL SS (BLADE) IMPLANT
BLADE SURG 15 STRL LF DISP TIS (BLADE) ×4 IMPLANT
BLADE SURG 15 STRL SS (BLADE) ×10
CANISTER SUC SOCK COL 7IN (MISCELLANEOUS) IMPLANT
CANISTER SUCT 1200ML W/VALVE (MISCELLANEOUS) ×5 IMPLANT
CHLORAPREP W/TINT 26ML (MISCELLANEOUS) ×5 IMPLANT
CLEANER CAUTERY TIP 5X5 PAD (MISCELLANEOUS) ×2 IMPLANT
CLIP APPLIE 11 MED OPEN (CLIP) ×1 IMPLANT
CLIP TI WIDE RED SMALL 6 (CLIP) ×8 IMPLANT
CLOSURE WOUND 1/2 X4 (GAUZE/BANDAGES/DRESSINGS)
CLOSURE WOUND 1/4X4 (GAUZE/BANDAGES/DRESSINGS)
COVER BACK TABLE 60X90IN (DRAPES) ×5 IMPLANT
COVER MAYO STAND STRL (DRAPES) ×8 IMPLANT
COVER PROBE W GEL 5X96 (DRAPES) ×5 IMPLANT
DECANTER SPIKE VIAL GLASS SM (MISCELLANEOUS) IMPLANT
DEVICE DUBIN W/COMP PLATE 8390 (MISCELLANEOUS) ×5 IMPLANT
DRAIN CHANNEL 19F RND (DRAIN) ×3 IMPLANT
DRAIN HEMOVAC 1/8 X 5 (WOUND CARE) IMPLANT
DRAPE LAPAROSCOPIC ABDOMINAL (DRAPES) ×5 IMPLANT
DRAPE LAPAROTOMY 100X72 PEDS (DRAPES) ×2 IMPLANT
DRAPE UTILITY XL STRL (DRAPES) ×5 IMPLANT
DRSG PAD ABDOMINAL 8X10 ST (GAUZE/BANDAGES/DRESSINGS) ×3 IMPLANT
ELECT COATED BLADE 2.86 ST (ELECTRODE) ×5 IMPLANT
ELECT REM PT RETURN 9FT ADLT (ELECTROSURGICAL) ×5
ELECTRODE REM PT RTRN 9FT ADLT (ELECTROSURGICAL) ×3 IMPLANT
EVACUATOR SILICONE 100CC (DRAIN) ×5 IMPLANT
GAUZE SPONGE 4X4 12PLY STRL (GAUZE/BANDAGES/DRESSINGS) ×5 IMPLANT
GLOVE BIOGEL PI IND STRL 7.0 (GLOVE) ×2 IMPLANT
GLOVE BIOGEL PI INDICATOR 7.0 (GLOVE) ×4
GLOVE ECLIPSE 6.5 STRL STRAW (GLOVE) ×6 IMPLANT
GLOVE SURG SIGNA 7.5 PF LTX (GLOVE) ×13 IMPLANT
GOWN STRL REUS W/ TWL LRG LVL3 (GOWN DISPOSABLE) ×3 IMPLANT
GOWN STRL REUS W/ TWL XL LVL3 (GOWN DISPOSABLE) ×3 IMPLANT
GOWN STRL REUS W/TWL LRG LVL3 (GOWN DISPOSABLE) ×5
GOWN STRL REUS W/TWL XL LVL3 (GOWN DISPOSABLE) ×5
KIT MARKER MARGIN INK (KITS) ×5 IMPLANT
LIQUID BAND (GAUZE/BANDAGES/DRESSINGS) ×8 IMPLANT
NDL HYPO 25X1 1.5 SAFETY (NEEDLE) ×2 IMPLANT
NDL SAFETY ECLIPSE 18X1.5 (NEEDLE) IMPLANT
NEEDLE HYPO 18GX1.5 SHARP (NEEDLE)
NEEDLE HYPO 25X1 1.5 SAFETY (NEEDLE) ×5 IMPLANT
NS IRRIG 1000ML POUR BTL (IV SOLUTION) ×5 IMPLANT
PACK BASIN DAY SURGERY FS (CUSTOM PROCEDURE TRAY) ×5 IMPLANT
PAD CLEANER CAUTERY TIP 5X5 (MISCELLANEOUS)
PENCIL BUTTON HOLSTER BLD 10FT (ELECTRODE) ×5 IMPLANT
PIN SAFETY STERILE (MISCELLANEOUS) ×5 IMPLANT
SHEET MEDIUM DRAPE 40X70 STRL (DRAPES) ×5 IMPLANT
SLEEVE SCD COMPRESS KNEE MED (MISCELLANEOUS) ×5 IMPLANT
SPONGE GAUZE 4X4 12PLY STER LF (GAUZE/BANDAGES/DRESSINGS) IMPLANT
SPONGE LAP 18X18 X RAY DECT (DISPOSABLE) ×8 IMPLANT
STAPLER VISISTAT 35W (STAPLE) IMPLANT
STRIP CLOSURE SKIN 1/2X4 (GAUZE/BANDAGES/DRESSINGS) IMPLANT
STRIP CLOSURE SKIN 1/4X4 (GAUZE/BANDAGES/DRESSINGS) IMPLANT
SUT ETHILON 2 0 FS 18 (SUTURE) ×3 IMPLANT
SUT MNCRL AB 4-0 PS2 18 (SUTURE) ×8 IMPLANT
SUT SILK 3 0 TIES 17X18 (SUTURE)
SUT SILK 3-0 18XBRD TIE BLK (SUTURE) IMPLANT
SUT VIC AB 5-0 PS2 18 (SUTURE) ×5 IMPLANT
SUT VICRYL 3-0 CR8 SH (SUTURE) ×8 IMPLANT
SUT VICRYL AB 2 0 TIE (SUTURE) IMPLANT
SUT VICRYL AB 2 0 TIES (SUTURE)
SYR BULB 3OZ (MISCELLANEOUS) ×5 IMPLANT
SYR CONTROL 10ML LL (SYRINGE) ×5 IMPLANT
TOWEL OR 17X24 6PK STRL BLUE (TOWEL DISPOSABLE) ×5 IMPLANT
TOWEL OR NON WOVEN STRL DISP B (DISPOSABLE) ×5 IMPLANT
TUBE CONNECTING 20'X1/4 (TUBING) ×1
TUBE CONNECTING 20X1/4 (TUBING) ×4 IMPLANT
YANKAUER SUCT BULB TIP NO VENT (SUCTIONS) ×5 IMPLANT

## 2015-05-01 NOTE — Progress Notes (Signed)
Assisted Dr. Gifford Shave with right, ultrasound guided, pectoralis block. Side rails up, monitors on throughout procedure. See vital signs in flow sheet. Tolerated Procedure well.

## 2015-05-01 NOTE — Discharge Instructions (Signed)
[facility] - DISCHARGE INSTRUCTIONS TO PATIENT  Activity:  Driving - may drive in 2 or 3 days, if doing well and taking no pain meds   Lifting - Take it easy for 7 days, then no limit  Wound Care:   May remove bandages and shower in 2 days.      Empty the drain once or twice a day.  Record the amount of the drainage.  Bring that record with you to our office.  Diet:  As tolerated.  Follow up appointment:  Call Dr. Pollie Friar office [facility]) at [phone] for an appointment in 7-10 days.  Medications and dosages:  Resume your home medications.  You have a prescription for:  Vicodin  Call Dr. Lucia Gaskins or his office  [phone]) if you have:  Temperature greater than 100.4,  Persistent nausea and vomiting,  Severe uncontrolled pain,  Redness, tenderness, or signs of infection (pain, swelling, redness, odor or green/yellow discharge around the site),  Any other questions or concerns you may have after discharge.  In an emergency, call 911 or go to an Emergency Department at a nearby hospital.    Post Anesthesia Home Care Instructions  Activity: Get plenty of rest for the remainder of the day. A responsible adult should stay with you for 24 hours following the procedure.  For the next 24 hours, DO NOT: -Drive a car -Paediatric nurse -Drink alcoholic beverages -Take any medication unless instructed by your physician -Make any legal decisions or sign important papers.  Meals: Start with liquid foods such as gelatin or soup. Progress to regular foods as tolerated. Avoid greasy, spicy, heavy foods. If nausea and/or vomiting occur, drink only clear liquids until the nausea and/or vomiting subsides. Call your physician if vomiting continues.  Special Instructions/Symptoms: Your throat may feel dry or sore from the anesthesia or the breathing tube placed in your throat during surgery. If this causes discomfort, gargle with warm salt water. The discomfort  should disappear within 24 hours.  If you had a scopolamine patch placed behind your ear for the management of post- operative nausea and/or vomiting:  1. The medication in the patch is effective for 72 hours, after which it should be removed.  Wrap patch in a tissue and discard in the trash. Wash hands thoroughly with soap and water. 2. You may remove the patch earlier than 72 hours if you experience unpleasant side effects which may include dry mouth, dizziness or visual disturbances. 3. Avoid touching the patch. Wash your hands with soap and water after contact with the patch.

## 2015-05-01 NOTE — Interval H&P Note (Signed)
History and Physical Interval Note:  05/01/2015 7:26 AM  Brandy Douglas  has presented today for surgery, with the diagnosis of right breast cancer  The various methods of treatment have been discussed with the patient and family.  Mother and sister are here with her.  Her seed is in good location.  She is participating in the tissue study.  After consideration of risks, benefits and other options for treatment, the patient has consented to  Procedure(s): RIGHT BREAST LUMPECTOMY WITH RADIOACTIVE SEED AND RIGHT AXILLARY LYMPH NODE DISSECTION (Right) REMOVAL PORT-A-CATH (N/A) AXILLARY LYMPH NODE DISSECTION (Right) as a surgical intervention .  The patient's history has been reviewed, patient examined, no change in status, stable for surgery.  I have reviewed the patient's chart and labs.  Questions were answered to the patient's satisfaction.     Urho Rio H

## 2015-05-01 NOTE — Transfer of Care (Signed)
Immediate Anesthesia Transfer of Care Note  Patient: Brandy Douglas  Procedure(s) Performed: Procedure(s): RIGHT BREAST LUMPECTOMY WITH RADIOACTIVE SEED AND RIGHT AXILLARY LYMPH NODE DISSECTION (Right) REMOVAL PORT-A-CATH (Left)  Patient Location: PACU  Anesthesia Type:GA combined with regional for post-op pain  Level of Consciousness: awake, alert  and oriented  Airway & Oxygen Therapy: Patient Spontanous Breathing and Patient connected to face mask oxygen  Post-op Assessment: Report given to RN and Post -op Vital signs reviewed and stable  Post vital signs: Reviewed and stable  Last Vitals:  Filed Vitals:   05/01/15 0720 05/01/15 0725  BP:    Pulse: 96 93  Temp:    Resp: 16 15    Complications: No apparent anesthesia complications

## 2015-05-01 NOTE — Anesthesia Preprocedure Evaluation (Addendum)
Anesthesia Evaluation  Patient identified by MRN, date of birth, ID band Patient awake    Reviewed: Allergy & Precautions, NPO status , Patient's Chart, lab work & pertinent test results  Airway Mallampati: II  TM Distance: >3 FB Neck ROM: Full    Dental no notable dental hx. (+) Teeth Intact   Pulmonary neg pulmonary ROS,    Pulmonary exam normal breath sounds clear to auscultation       Cardiovascular negative cardio ROS Normal cardiovascular exam Rhythm:Regular Rate:Normal     Neuro/Psych Anxiety  Neuromuscular disease    GI/Hepatic negative GI ROS, Neg liver ROS,   Endo/Other  Right Breast Ca  Renal/GU negative Renal ROS  negative genitourinary   Musculoskeletal negative musculoskeletal ROS (+)   Abdominal   Peds  Hematology  (+) anemia ,   Anesthesia Other Findings   Reproductive/Obstetrics negative OB ROS                             Anesthesia Physical Anesthesia Plan  ASA: II  Anesthesia Plan: General   Post-op Pain Management:    Induction: Intravenous  Airway Management Planned: LMA  Additional Equipment:   Intra-op Plan:   Post-operative Plan: Extubation in OR  Informed Consent: I have reviewed the patients History and Physical, chart, labs and discussed the procedure including the risks, benefits and alternatives for the proposed anesthesia with the patient or authorized representative who has indicated his/her understanding and acceptance.   Dental advisory given  Plan Discussed with: CRNA, Anesthesiologist and Surgeon  Anesthesia Plan Comments:         Anesthesia Quick Evaluation

## 2015-05-01 NOTE — Anesthesia Postprocedure Evaluation (Signed)
Anesthesia Post Note  Patient: Brandy Douglas  Procedure(s) Performed: Procedure(s) (LRB): RIGHT BREAST LUMPECTOMY WITH RADIOACTIVE SEED AND RIGHT AXILLARY LYMPH NODE DISSECTION (Right) REMOVAL PORT-A-CATH (Left)  Patient location during evaluation: PACU Anesthesia Type: General Level of consciousness: awake and alert and oriented Pain management: pain level controlled Vital Signs Assessment: post-procedure vital signs reviewed and stable Respiratory status: spontaneous breathing, nonlabored ventilation and respiratory function stable Cardiovascular status: blood pressure returned to baseline and stable Postop Assessment: no signs of nausea or vomiting Anesthetic complications: no    Last Vitals:  Filed Vitals:   05/01/15 1000 05/01/15 1015  BP: 126/91 130/89  Pulse: 89 96  Temp:    Resp: 11 17    Last Pain: There were no vitals filed for this visit.               Cornell Gaber A.

## 2015-05-01 NOTE — Anesthesia Procedure Notes (Addendum)
Anesthesia Regional Block:  Pectoralis block  Pre-Anesthetic Checklist: ,, timeout performed, Correct Patient, Correct Site, Correct Laterality, Correct Procedure, Correct Position, site marked, Risks and benefits discussed,  Surgical consent,  Pre-op evaluation,  At surgeon's request and post-op pain management  Laterality: Right  Prep: chloraprep       Needles:  Injection technique: Single-shot  Needle Type: Echogenic Stimulator Needle     Needle Length: 9cm 9 cm Needle Gauge: 22 and 22 G  Needle insertion depth: 3.5 cm   Additional Needles:  Procedures: ultrasound guided (picture in chart) Pectoralis block Narrative:  Injection made incrementally with aspirations every 5 mL.  Performed by: Personally  Anesthesiologist: Josephine Igo  Additional Notes: Patient tolerated procedure well.    Procedure Name: LMA Insertion Date/Time: 05/01/2015 7:43 AM Performed by: Melynda Ripple D Pre-anesthesia Checklist: Patient identified, Emergency Drugs available, Suction available and Patient being monitored Patient Re-evaluated:Patient Re-evaluated prior to inductionOxygen Delivery Method: Circle System Utilized Preoxygenation: Pre-oxygenation with 100% oxygen Intubation Type: IV induction Ventilation: Mask ventilation without difficulty LMA: LMA inserted LMA Size: 4.0 Number of attempts: 1 Airway Equipment and Method: Bite block Placement Confirmation: positive ETCO2 Tube secured with: Tape Dental Injury: Teeth and Oropharynx as per pre-operative assessment

## 2015-05-01 NOTE — Op Note (Signed)
05/01/2015  9:33 AM  PATIENT:  Brandy Douglas DOB: Sep 24, 1970 MRN: [id number]  PREOP DIAGNOSIS:  right breast cancer, completion of neoadjuvant chemotherapy  POSTOP DIAGNOSIS:   Right breast cancer, 6 o'clock position (T2, N3), neoadjuvant chemotherapy  PROCEDURE:   Procedure(s):  RIGHT BREAST LUMPECTOMY WITH RADIOACTIVE SEED, RIGHT AXILLARY LYMPH NODE DISSECTION, and REMOVAL PORT-A-CATH  SURGEON:   [name], M.D.  ANESTHESIA:   general  Anesthesiologist: Josephine Igo, MD CRNA: Willa Frater, CRNA; Tawni Millers, CRNA  General  EBL:  100  ml  DRAINS: right axillary 19 F Blake drain  LOCAL MEDICATIONS USED:   25 cc 1/4% marcaine  SPECIMEN:   Right breast lumpectomy (suture medial), right axillary lymph node dissection  COUNTS CORRECT:  YES  INDICATIONS FOR PROCEDURE:  Brandy Douglas is a 45 y.o. (DOB: [birth year]) AA  female whose primary care physician is FULBRIGHT, VIRGINIA E, PA-C and comes for right  breast lumpectomy, right axillary lymph node dissection, and removal of the power port.   Her treating oncologist are Drs. Philis Nettle.   The options for breast cancer treatment have been discussed with the patient. She has completed adjuvant chemotherapy.  She elected to proceed with right breast lumpectomy and axillary lymph node dissection.    The indications and potential complications of surgery were explained to the patient. Potential complications include, but are not limited to, bleeding, infection, the need for further surgery, and nerve injury.     She had a I131 seed placed on 04/25/2014 in her right breast at [facility].  I confirmed the presence of the I131 seed in the pre op area using the Neoprobe.  The seed is in the 6 o'clock position of the right breast.     OPERATIVE NOTE:   The patient was taken to room # 5 at [facility] where she underwent a general anesthesia  supervised by Anesthesiologist: Josephine Igo, MD CRNA: Willa Frater,  CRNA; Tawni Millers, CRNA. Both her breast and right axilla were prepped with  ChloraPrep and sterilely draped.    A time-out and the surgical check list was reviewed.    I turned attention to the cancer which was about at the 6 o'clock position of the right breast.   I used the Neoprobe to identify the I131 seed.  I tried to excise an area around the tumor of at least 1 cm.    I excised this block of breast tissue approximately 4 cm by 4 cm  in diameter.  I essentially took the dissection down to the chest wall.  I placed a suture in the medial aspect of the specimen.   I painted the lumpectomy specimen with the 6 color paint kit and did a specimen mammogram which confirmed the mass, clip, and the seed were all in the right position in the specimen.  The specimen was sent to pathology who called back to confirm that they have the seed and the specimen.   Secondly, I made an incision over the power port in the upper inner quadrant of the left breast and removed the power port intact.  The wound was closed with 3-0 vicryl sutures and 4-0 Monocryl for the skin.   Thirdly, I started the right axillary sentinel lymph node dissection. I made an incision in the right axilla and cut down to the right axillary fat pad.   I identified the right axillary vein and swept the axillary contents down from below the vein.  I went behind the pectoralis minor muscle to remove the Level II nodes.  I identified the long Thoracic nerve of Bell and the thoracodorsal nerves and spared these nerves.  I used clips, 3-0 vicryl sutures and bovie to control the bleeding.  The axillary contents were sent to pathology.   I then irrigated the wound with saline. I infiltrated approximately 25 mL of 1/4% marcaine between the incisions.  I placed 6 clips to mark the right breast biopsy cavity, at 12, 3, 6, and 9 o'clock. Two clips were placed on the pectoralis major.  I placed a 19 F drain in the right axillar.   I then closed all the  wounds in layers using 3-0 Vicryl sutures for the deep layer. At the skin, I closed the incisions with a 4-0 Monocryl suture. The incisions were then painted with LiquiBand.  She had gauze place over the wounds and placed in a breast binder.   The patient tolerated the procedure well, was transported to the recovery room in good condition. Sponge and needle count were correct at the end of the case.   Final pathology is pending.   [name], MD, [facility] Surgery Pager: [phone] Office phone:  [phone]

## 2015-05-02 ENCOUNTER — Encounter (HOSPITAL_BASED_OUTPATIENT_CLINIC_OR_DEPARTMENT_OTHER): Payer: Self-pay | Admitting: Surgery

## 2015-05-07 NOTE — Assessment & Plan Note (Signed)
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

## 2015-05-08 ENCOUNTER — Encounter: Payer: Self-pay | Admitting: Hematology and Oncology

## 2015-05-08 ENCOUNTER — Ambulatory Visit (HOSPITAL_BASED_OUTPATIENT_CLINIC_OR_DEPARTMENT_OTHER): Payer: 59 | Admitting: Hematology and Oncology

## 2015-05-08 VITALS — BP 120/81 | HR 107 | Temp 97.8°F | Resp 18 | Ht 67.0 in | Wt 176.8 lb

## 2015-05-08 DIAGNOSIS — Z171 Estrogen receptor negative status [ER-]: Secondary | ICD-10-CM

## 2015-05-08 DIAGNOSIS — C50311 Malignant neoplasm of lower-inner quadrant of right female breast: Secondary | ICD-10-CM

## 2015-05-08 DIAGNOSIS — G893 Neoplasm related pain (acute) (chronic): Secondary | ICD-10-CM

## 2015-05-08 DIAGNOSIS — T451X5A Adverse effect of antineoplastic and immunosuppressive drugs, initial encounter: Secondary | ICD-10-CM

## 2015-05-08 DIAGNOSIS — C773 Secondary and unspecified malignant neoplasm of axilla and upper limb lymph nodes: Secondary | ICD-10-CM | POA: Diagnosis not present

## 2015-05-08 DIAGNOSIS — G620 Drug-induced polyneuropathy: Secondary | ICD-10-CM

## 2015-05-08 NOTE — Progress Notes (Signed)
Patient Care Team: Elisabeth Cara, PA-C as PCP - General (Family Medicine) [name], MD as Consulting Physician (General Surgery) Nicholas Lose, MD as Consulting Physician (Hematology and Oncology) Eppie Gibson, MD as Attending Physician (Radiation Oncology) Rockwell Germany, RN as Registered Nurse Mauro Kaufmann, RN as Registered Nurse  DIAGNOSIS: Breast cancer of lower-inner quadrant of right female breast [facility])   Staging form: Breast, AJCC 7th Edition     Clinical stage from 09/07/2014: Stage IIIC (T2, N3, M0) - Unsigned       Staging comments: Staged at breast conference on 6.29.16  SUMMARY OF ONCOLOGIC HISTORY:   Breast cancer of lower-inner quadrant of right female breast ([place])   08/29/2014 Mammogram Right breast mass 5 x 4.8 x 4.2 cm, 6 enlarged axillary lymph nodes including nodes in level III location, T2 N3 M0 stage IIIc clinical stage   08/31/2014 Initial Diagnosis Right breast biopsy 6:00: Invasive ductal carcinoma, right axillary lymph node biopsy high-grade carcinoma ER 0% PR 0% HER-2 negative ratio 1.68, Ki-67 80%, grade 3    Procedure Genetic testing Negative   09/19/2014 - 02/13/2015 Neo-Adjuvant Chemotherapy Adriamycin Cytoxan dose dense 4 followed by Abraxane and carboplatin weekly 12   02/20/2015 Breast MRI Complete radiologic response   05/01/2015 Surgery Rt Lumpectomy: No malignancy, 0/14 LN    CHIEF COMPLIANT: follow-up after recent lumpectomy to discuss pathology report  INTERVAL HISTORY: Brandy Douglas is a 67 year with above-mentioned history of right breast cancer who received neoadjuvant chemotherapy and had a complete pathologic response. Her surgery was on 05/01/2015. She is here to discuss the pathology report. She complains of pain under the breast related to the presence of the drain. It makes it very uncomfortable for her. She has an appointment this Friday to see Dr. Lucia Gaskins.  REVIEW OF SYSTEMS:   Constitutional: Denies fevers, chills or abnormal  weight loss Eyes: Denies blurriness of vision Ears, nose, mouth, throat, and face: Denies mucositis or sore throat Respiratory: Denies cough, dyspnea or wheezes Cardiovascular: Denies palpitation, chest discomfort Gastrointestinal:  Denies nausea, heartburn or change in bowel habits Skin: Denies abnormal skin rashes Lymphatics: Denies new lymphadenopathy or easy bruising Neurological:Denies numbness, tingling or new weaknesses Behavioral/Psych: Mood is stable, no new changes  Extremities: No lower extremity edema Breast: pain related to recent surgery All other systems were reviewed with the patient and are negative.  I have reviewed the past medical history, past surgical history, social history and family history with the patient and they are unchanged from previous note.  ALLERGIES:  has No Known Allergies.  MEDICATIONS:  Current Outpatient Prescriptions  Medication Sig Dispense Refill  . dexamethasone (DECADRON) 4 MG tablet     . HYDROcodone-acetaminophen (NORCO/VICODIN) 5-325 MG per tablet Take 1-2 tablets by mouth every 6 (six) hours as needed. 30 tablet 0  . HYDROcodone-acetaminophen (NORCO/VICODIN) 5-325 MG tablet Take 1-2 tablets by mouth every 6 (six) hours as needed. 40 tablet 0  . lidocaine-prilocaine (EMLA) cream Apply to affected area once 30 g 3  . LORazepam (ATIVAN) 0.5 MG tablet Take 1 tablet (0.5 mg total) by mouth at bedtime. 30 tablet 0  . naproxen sodium (RA NAPROXEN SODIUM) 220 MG tablet Take 440 mg by mouth.    . ondansetron (ZOFRAN) 8 MG tablet Take 1 tablet (8 mg total) by mouth 2 (two) times daily as needed. Start on the third day after chemotherapy. 30 tablet 1  . prochlorperazine (COMPAZINE) 10 MG tablet Take 1 tablet (10 mg total)  by mouth every 6 (six) hours as needed (Nausea or vomiting). 30 tablet 1   No current facility-administered medications for this visit.    PHYSICAL EXAMINATION: ECOG PERFORMANCE STATUS: 1 - Symptomatic but completely  ambulatory  Filed Vitals:   05/08/15 1102  BP: 120/81  Pulse: 107  Temp: 97.8 F (36.6 C)  Resp: 18   Filed Weights   05/08/15 1102  Weight: 176 lb 12.8 oz (80.196 kg)    GENERAL:alert, no distress and comfortable SKIN: skin color, texture, turgor are normal, no rashes or significant lesions EYES: normal, Conjunctiva are pink and non-injected, sclera clear OROPHARYNX:no exudate, no erythema and lips, buccal mucosa, and tongue normal  NECK: supple, thyroid normal size, non-tender, without nodularity LYMPH:  no palpable lymphadenopathy in the cervical, axillary or inguinal LUNGS: clear to auscultation and percussion with normal breathing effort HEART: regular rate & rhythm and no murmurs and no lower extremity edema ABDOMEN:abdomen soft, non-tender and normal bowel sounds MUSCULOSKELETAL:no cyanosis of digits and no clubbing  NEURO: alert & oriented x 3 with fluent speech, no focal motor/sensory deficits EXTREMITIES: No lower extremity edema BREAST:I examined underneath the breast where she had the drain. There is some oozing of fluid material from outside the drain. The there is no evidence of skin erythema around the site of the drain. (exam performed in the presence of a chaperone)  LABORATORY DATA:  I have reviewed the data as listed   Chemistry      Component Value Date/Time   NA 142 04/25/2015 1315   NA 139 02/13/2015 0847   K 4.3 04/25/2015 1315   K 4.2 02/13/2015 0847   CL 105 04/25/2015 1315   CO2 26 04/25/2015 1315   CO2 24 02/13/2015 0847   BUN 12 04/25/2015 1315   BUN 7.8 02/13/2015 0847   CREATININE 0.73 04/25/2015 1315   CREATININE 0.7 02/13/2015 0847      Component Value Date/Time   CALCIUM 9.8 04/25/2015 1315   CALCIUM 9.4 02/13/2015 0847   ALKPHOS 69 04/25/2015 1315   ALKPHOS 71 02/13/2015 0847   AST 19 04/25/2015 1315   AST 16 02/13/2015 0847   ALT 16 04/25/2015 1315   ALT 9 02/13/2015 0847   BILITOT 0.6 04/25/2015 1315   BILITOT <0.30  02/13/2015 0847       Lab Results  Component Value Date   WBC 6.2 04/25/2015   HGB 10.6* 04/25/2015   HCT 33.0* 04/25/2015   MCV 93.2 04/25/2015   PLT 288 04/25/2015   NEUTROABS 3.7 04/25/2015     ASSESSMENT & PLAN:  Breast cancer of lower-inner quadrant of right female breast Right breast mass 5 x 4.8 x 4.2 cm, 6 enlarged axillary lymph nodes including nodes in level III location, T2 N3 M0 stage IIIc clinical stage Right breast biopsy 6:00: Invasive ductal carcinoma, right axillary lymph node biopsy high-grade carcinoma ER 0% PR 0% HER-2 negative ratio 1.68, Ki-67 80%, grade 3 Neo-adjuvant chemotherapy with dose dense Adriamycin and Cytoxan 4 followed by weekly Abraxane and carboplatin 12 (started 09/19/2014 and completed 02/13/2015) (carboplatin discontinued for thrombocytopenia from cycle 9) Breast MRI 02/20/2015: Complete radiologic response Rt Lumpectomy: 05/01/15: Complete Path response 0/14 LN -------------------------------------------------------------------------------------------------------------------------- Treatment Plan: Adj XRT Pain in the right axilla: Related to surgery. There is no sign of infection. RTC in 6 months for surveillance  No orders of the defined types were placed in this encounter.   The patient has a good understanding of the overall plan. she agrees with it. she  will call with any problems that may develop before the next visit here.   Rulon Eisenmenger, MD 05/08/2015

## 2015-05-16 NOTE — Progress Notes (Signed)
Location of Breast Cancer: Right Breast  Histology per Pathology Report:  05/01/15 Diagnosis 1. Breast, lumpectomy, Right - CHANGES CONSISTENT WITH CHEMOTHERAPY TREATMENT. - THERE IS NO EVIDENCE OF MALIGNANCY. - SEE ONCOLOGY TABLE BELOW. 2. Lymph nodes, regional resection, Right axillary contents - THERE IS NO EVIDENCE OF CARCINOMA IN 14 OF 14 LYMPH NODES (0/14).  Receptor Status: ER(NEG), PR (NEG), Her2-neu (NEG)  Did patient present with symptoms or was this found on screening mammography?: She palpated the mass. She then had a mammogram and ultrasound performed which demonstrated the mass on 08/29/15.  Past/Anticipated interventions by surgeon, if any: Procedure(s): RIGHT BREAST LUMPECTOMY WITH RADIOACTIVE SEED, RIGHT AXILLARY LYMPH NODE DISSECTION, and REMOVAL PORT-A-CATH on 05/01/15 by Dr. Newman.   Past/Anticipated interventions by medical oncology, if any: Dr. Gudena's note 05/08/15 Breast cancer of lower-inner quadrant of right female breast Right breast mass 5 x 4.8 x 4.2 cm, 6 enlarged axillary lymph nodes including nodes in level III location, T2 N3 M0 stage IIIc clinical stage Right breast biopsy 6:00: Invasive ductal carcinoma, right axillary lymph node biopsy high-grade carcinoma ER 0% PR 0% HER-2 negative ratio 1.68, Ki-67 80%, grade 3 Neo-adjuvant chemotherapy with dose dense Adriamycin and Cytoxan 4 followed by weekly Abraxane and carboplatin 12 (started 09/19/2014 and completed 02/13/2015) (carboplatin discontinued for thrombocytopenia from cycle 9) Breast MRI 02/20/2015: Complete radiologic response Rt Lumpectomy: 05/01/15: Complete Path response 0/14 LN  Lymphedema issues, if any:  No  Pain issues, if any:  She reports pain a 6/10 in her upper Right arm that has been there since her surgery on 05/01/15. She is taking hydrocodone occasionally. She reports she has decreased her use of hydrocodone, and has not taking any in the past few days. She is taking Tylenol at  times also. She reports neither of these medicines are working well for her.   SAFETY ISSUES:  Prior radiation? No  Pacemaker/ICD? No  Possible current pregnancy? No  Is the patient on methotrexate? No  Current Complaints / other details:  She has questions regarding physical therapy for her arm. She had a follow up with Dr. Newman on 05/12/15 and he took a drainage tube out, and she should heal well. He was aware of her arm pain.   BP 110/76 mmHg  Pulse 91  Temp(Src) 97.8 F (36.6 C)  Ht 5' 7" (1.702 m)  Wt 183 lb 9.6 oz (83.28 kg)  BMI 28.75 kg/m2   Wt Readings from Last 3 Encounters:  05/19/15 183 lb 9.6 oz (83.28 kg)  05/08/15 176 lb 12.8 oz (80.196 kg)  05/01/15 171 lb 9.6 oz (77.837 kg)      Malmfelt, Jennifer L, RN 05/16/2015,10:22 AM   

## 2015-05-19 ENCOUNTER — Ambulatory Visit
Admission: RE | Admit: 2015-05-19 | Discharge: 2015-05-19 | Disposition: A | Discharge status: Home / Self Care | Payer: 59 | Source: Ambulatory Visit | Attending: Radiation Oncology | Admitting: Radiation Oncology

## 2015-05-19 ENCOUNTER — Telehealth: Payer: Self-pay | Admitting: *Deleted

## 2015-05-19 ENCOUNTER — Encounter: Payer: Self-pay | Admitting: Radiation Oncology

## 2015-05-19 VITALS — BP 110/76 | HR 91 | Temp 97.8°F | Ht 67.0 in | Wt 183.6 lb

## 2015-05-19 DIAGNOSIS — Z9889 Other specified postprocedural states: Secondary | ICD-10-CM | POA: Insufficient documentation

## 2015-05-19 DIAGNOSIS — M79621 Pain in right upper arm: Secondary | ICD-10-CM | POA: Diagnosis not present

## 2015-05-19 DIAGNOSIS — C50311 Malignant neoplasm of lower-inner quadrant of right female breast: Secondary | ICD-10-CM | POA: Insufficient documentation

## 2015-05-19 DIAGNOSIS — Z171 Estrogen receptor negative status [ER-]: Secondary | ICD-10-CM | POA: Insufficient documentation

## 2015-05-19 DIAGNOSIS — Z510 Encounter for antineoplastic radiation therapy: Secondary | ICD-10-CM | POA: Diagnosis present

## 2015-05-19 NOTE — Telephone Encounter (Signed)
CALLED PATIENT TO INFORM OF PT APPT. FOR 05-23-15 - ARRIVAL TIME - 12:45 PM @ [facility], SPOKE WITH PATIENT AND SHE IS AWARE OF THIS APPT.

## 2015-05-19 NOTE — Progress Notes (Signed)
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 Breast cancer of lower-inner quadrant of right female breast (HCC) 174.3 C50.311      HISTORY OF PRESENT ILLNESS::Brandy Douglas is a 45 y.o. female who presented with T2 N3 M0 Stage IIIc breast cancer of the lower-inner quadrant of the right female breast. Biopsy showed invasive ductal carcinoma with right axillary lymph node biopsy revealing high-grade carcinoma with characteristics as described above in the diagnosis. She initially palpated a mass in the lower-inner quadrant of the right female breast. She then had a mammogram and ultrasound performed on 08/29/15 which demonstrated the mass. The patient had right breast lumpectomy with radioactive seed, right axillary lymph node dissection, and removal of port-a-cath performed on 05/01/15 by Dr. Newman. 14 lymph nodes were removed and they were all negative. There was no evidence of malignancy in lumpectomy specimen. She had a complete response.   Today, she reports pain a 6/10 in her upper right arm that has been there since her surgery on 05/01/15. She is taking hydrocodone occasionally. She reports that she has decreased her use of hydrocodone and has not taken any in the past few days. She is taking Tylenol at times also. She reports neither of these medicines are working well for her. She has questions regarding physical therapy for her arm. She had a follow up with Dr. Newman on 05/12/15. He took a drainage tube out, and stated she should heal well. He was aware of her arm pain.   SAFETY ISSUES:  Pacemaker/ICD? No  Possible current pregnancy?  No  Is the patient on methotrexate? No  PREVIOUS RADIATION THERAPY: No  PAST MEDICAL HISTORY:  has a past medical history of Breast cancer (HCC) (2016) and Anxiety.    PAST SURGICAL HISTORY: Past Surgical History  Procedure Laterality Date  . Cholecystectomy    . Tubal ligation    . Portacath placement Left 09/16/2014    Procedure: INSERTION PORT-A-CATH WITH ULTRA SOUND ;  Surgeon: David Newman, MD;  Location: [facility];  Service: General;  Laterality: Left;  . Breast lumpectomy with radioactive seed and axillary lymph node dissection Right 05/01/2015    Procedure: RIGHT BREAST LUMPECTOMY WITH RADIOACTIVE SEED AND RIGHT AXILLARY LYMPH NODE DISSECTION;  Surgeon: David Newman, MD;  Location: [facility];  Service: General;  Laterality: Right;  . Port-a-cath removal Left 05/01/2015    Procedure: REMOVAL PORT-A-CATH;  Surgeon: David Newman, MD;  Location: [facility];  Service: General;  Laterality: Left;    FAMILY HISTORY: family history includes Breast cancer (age of onset: 40) in her paternal grandmother; Cancer in her paternal uncle; Diabetes in her father and paternal aunt; Heart attack in her paternal uncle; Hypertension in her father and paternal aunt; Other in her sister and sister; Stroke in her paternal aunt.  SOCIAL HISTORY:  reports that she has never smoked. She has never used smokeless tobacco. She reports that she drinks alcohol. She reports that she does not use illicit drugs.  ALLERGIES: Review of patient's allergies indicates no known allergies.  MEDICATIONS:  Current Outpatient Prescriptions    Medication Sig Dispense Refill  . HYDROcodone-acetaminophen (NORCO/VICODIN) 5-325 MG per tablet Take 1-2 tablets by mouth every 6 (six) hours as needed. 30 tablet 0  . LORazepam (ATIVAN) 0.5 MG tablet Take 1 tablet (0.5 mg total) by mouth at bedtime. (Patient not taking: Reported on 05/19/2015) 30 tablet 0  . ondansetron (ZOFRAN) 8 MG tablet  Take 1 tablet (8 mg total) by mouth 2 (two) times daily as needed. Start on the third day after chemotherapy. (Patient not taking: Reported on 05/19/2015) 30 tablet 1  . prochlorperazine (COMPAZINE) 10 MG tablet Take 1 tablet (10 mg total) by mouth every 6 (six) hours as needed (Nausea or vomiting). (Patient not taking: Reported on 05/19/2015) 30 tablet 1   No current facility-administered medications for this encounter.    REVIEW OF SYSTEMS:  Notable for that above.   PHYSICAL EXAM:  height is 5' 7" (1.702 m) and weight is 183 lb 9.6 oz (83.28 kg). Her temperature is 97.8 F (36.6 C). Her blood pressure is 110/76 and her pulse is 91.    General: Alert and oriented, in no acute distress HEENT: Head is normocephalic. Extraocular movements are intact. Oropharynx is clear. Neck: Neck is supple, no palpable cervical or supraclavicular lymphadenopathy. Heart: Regular in rate and rhythm with no murmurs, rubs, or gallops. Chest: Clear to auscultation bilaterally, with no rhonchi, wheezes, or rales. Abdomen: Soft, nontender, nondistended, with no rigidity or guarding. Extremities: No cyanosis or edema. Lymphatics: see Neck Exam Skin: No concerning lesions. Musculoskeletal: symmetric strength and muscle tone throughout. Neurologic: Cranial nerves II through XII are grossly intact. No obvious focalities. Speech is fluent. Coordination is intact. Psychiatric: Judgment and insight are intact. Affect is appropriate. Breasts: Healing port-a-cath scar in the left upper breast. No palpable masses appreciated in the left breast or left axilla. Incomplete range of motion is noted in the right shoulder. Scars are healing well at right lumpectomy and right axillary sites.     ECOG = 0  0 - Asymptomatic (Fully active, able to carry on all predisease activities without restriction)  1 - Symptomatic but completely ambulatory (Restricted in physically strenuous activity but ambulatory and able to carry out work  of a light or sedentary nature. For example, light housework, office work)  2 - Symptomatic, <50% in bed during the day (Ambulatory and capable of all self care but unable to carry out any work activities. Up and about more than 50% of waking hours)  3 - Symptomatic, >50% in bed, but not bedbound (Capable of only limited self-care, confined to bed or chair 50% or more of waking hours)  4 - Bedbound (Completely disabled. Cannot carry on any self-care. Totally confined to bed or chair)  5 - Death   Oken MM, Creech RH, Tormey DC, et al. (1982). "Toxicity and response criteria of the Eastern Cooperative Oncology Group". Am. J. Clin. Oncol. 5 (6): 649-55   LABORATORY DATA:  Lab Results  Component Value Date   WBC 6.2 04/25/2015   HGB 10.6* 04/25/2015   HCT 33.0* 04/25/2015   MCV 93.2 04/25/2015   PLT 288 04/25/2015   CMP     Component Value Date/Time   NA 142 04/25/2015 1315   NA 139 02/13/2015 0847   K 4.3 04/25/2015 1315   K 4.2 02/13/2015 0847   CL 105 04/25/2015 1315   CO2 26 04/25/2015 1315   CO2 24 02/13/2015 0847   GLUCOSE 101* 04/25/2015 1315   GLUCOSE 100 02/13/2015 0847   BUN 12   04/25/2015 1315   BUN 7.8 02/13/2015 0847   CREATININE 0.73 04/25/2015 1315   CREATININE 0.7 02/13/2015 0847   CALCIUM 9.8 04/25/2015 1315   CALCIUM 9.4 02/13/2015 0847   PROT 7.9 04/25/2015 1315   PROT 7.7 02/13/2015 0847   ALBUMIN 3.3* 04/25/2015 1315   ALBUMIN 3.7 02/13/2015 0847   AST 19 04/25/2015 1315   AST 16 02/13/2015 0847   ALT 16 04/25/2015 1315   ALT 9 02/13/2015 0847   ALKPHOS 69 04/25/2015 1315   ALKPHOS 71 02/13/2015 0847   BILITOT 0.6 04/25/2015 1315   BILITOT <0.30 02/13/2015 0847   GFRNONAA >60 04/25/2015 1315   GFRAA >60 04/25/2015 1315         RADIOGRAPHY: Mm Breast Surgical Specimen  05/01/2015  CLINICAL DATA:  Right breast cancer. Radioactive seed localization was performed 04/26/2015. EXAM: SPECIMEN RADIOGRAPH OF THE RIGHT BREAST COMPARISON:  Previous  exam(s). FINDINGS: Status post excision of the right breast. The radioactive seed and biopsy marker clip are present, completely intact, and were marked for pathology. IMPRESSION: Specimen radiograph of the right breast. Electronically Signed   By: Curlene Dolphin M.D.   On: 05/01/2015 08:35   Mm Rt Radioactive Seed Loc Mammo Guide  04/26/2015  CLINICAL DATA:  Biopsy proven grade III invasive ductal carcinoma of the right breast. EXAM: MAMMOGRAPHIC GUIDED RADIOACTIVE SEED LOCALIZATION OF THE RIGHT BREAST COMPARISON:  Previous exam(s). FINDINGS: Patient presents for radioactive seed localization prior to surgical excision. I met with the patient and we discussed the procedure of seed localization including benefits and alternatives. We discussed the high likelihood of a successful procedure. We discussed the risks of the procedure including infection, bleeding, tissue injury and further surgery. We discussed the low dose of radioactivity involved in the procedure. Informed, written consent was given. The usual time-out protocol was performed immediately prior to the procedure. Using mammographic guidance, sterile technique, 2% lidocaine and an I-125 radioactive seed, the ribbon shaped clip was localized using a medial to lateral approach. The follow-up mammogram images confirm the seed in the expected location and were marked for Dr. Lucia Gaskins. Follow-up survey of the patient confirms presence of the radioactive seed. Order number of I-125 seed:  [id number]. Total activity:  4.431 millicuries  Reference Date: 02/20/2015 The patient tolerated the procedure well and was released from the [facility]. She was given instructions regarding seed removal. IMPRESSION: Radioactive seed localization right breast. No apparent complications. Electronically Signed   By: Lillia Mountain M.D.   On: 04/26/2015 14:13      IMPRESSION/PLAN: right breast cancer  It was a pleasure meeting the patient today. We discussed the risks,  benefits, and side effects of radiotherapy. I recommend radiotherapy to the right breast and regional nodes to reduce her risk of locoregional recurrence by 2/3.  We discussed that radiation would take approximately 6 weeks to complete and that I would give the patient a few more weeks to heal following surgery before starting treatment planning.  We spoke about acute effects including skin irritation and fatigue as well as much less common late effects including internal organ injury or irritation. We spoke about the latest technology that is used to minimize the risk of late effects for patients undergoing radiotherapy to the breast or chest wall. No guarantees of treatment were given. The patient is enthusiastic about proceeding with treatment. I look forward to participating in the patient's care. The patient has signed informed consent to move forward with radiation treatment.   I will  make a referral to physical therapy due to risk of lymphedema from surgery and to discuss symptoms that she is experiencing with her arm ROM.  __________________________________________   Sarah Squire, MD   This document serves as a record of services personally performed by Sarah Squire, MD. It was created on her behalf by Jaslyn Piggott, a trained medical scribe. The creation of this record is based on the scribe's personal observations and the provider's statements to them. This document has been checked and approved by the attending provider   

## 2015-05-19 NOTE — Addendum Note (Signed)
Encounter addended by: Ernst Spell, RN on: 05/19/2015  1:09 PM<BR>     Documentation filed: Charges VN

## 2015-05-22 ENCOUNTER — Ambulatory Visit: Payer: 59 | Attending: Radiation Oncology | Admitting: Physical Therapy

## 2015-05-22 ENCOUNTER — Encounter: Payer: Self-pay | Admitting: Physical Therapy

## 2015-05-22 DIAGNOSIS — M25611 Stiffness of right shoulder, not elsewhere classified: Secondary | ICD-10-CM | POA: Diagnosis present

## 2015-05-22 DIAGNOSIS — C773 Secondary and unspecified malignant neoplasm of axilla and upper limb lymph nodes: Secondary | ICD-10-CM | POA: Diagnosis present

## 2015-05-22 DIAGNOSIS — R29898 Other symptoms and signs involving the musculoskeletal system: Secondary | ICD-10-CM | POA: Insufficient documentation

## 2015-05-22 DIAGNOSIS — M25511 Pain in right shoulder: Secondary | ICD-10-CM | POA: Insufficient documentation

## 2015-05-22 DIAGNOSIS — C50911 Malignant neoplasm of unspecified site of right female breast: Secondary | ICD-10-CM | POA: Insufficient documentation

## 2015-05-22 NOTE — Therapy (Signed)
[place], [place], [address] Phone: [phone]   Fax:  [phone]  Physical Therapy Evaluation  Patient Details  Name: Brandy Douglas MRN: [id number] Date of Birth: 1970/03/23 No Data Recorded  Encounter Date: 05/22/2015      PT End of Session - 05/22/15 1341    Visit Number 1   Number of Visits 16   Date for PT Re-Evaluation 07/17/15   PT Start Time [id number]   PT Stop Time 1338   PT Time Calculation (min) 43 min   Activity Tolerance Patient tolerated treatment well   Behavior During Therapy [facility] for tasks assessed/performed      Past Medical History  Diagnosis Date  . Breast cancer ([place]) 2016    R LIQ IDC; triple negative  . Anxiety     Past Surgical History  Procedure Laterality Date  . Cholecystectomy    . Tubal ligation    . Portacath placement Left 09/16/2014    Procedure: INSERTION PORT-A-CATH WITH ULTRA SOUND ;  Surgeon: [name], MD;  Location: [place];  Service: General;  Laterality: Left;  . Breast lumpectomy with radioactive seed and axillary lymph node dissection Right 05/01/2015    Procedure: RIGHT BREAST LUMPECTOMY WITH RADIOACTIVE SEED AND RIGHT AXILLARY LYMPH NODE DISSECTION;  Surgeon: [name], MD;  Location: [place];  Service: General;  Laterality: Right;  . Port-a-cath removal Left 05/01/2015    Procedure: REMOVAL PORT-A-CATH;  Surgeon: [name], MD;  Location: [place];  Service: General;  Laterality: Left;    There were no vitals filed for this visit.  Visit Diagnosis:  Limitation of joint motion of right shoulder - Plan: PT plan of care cert/re-cert  RUE weakness - Plan: PT plan of care cert/re-cert  Right shoulder pain - Plan: PT plan of care cert/re-cert      Subjective Assessment - 05/22/15 1257    Subjective I am here because I need to be able to move my arm so I can get radiation therapy.    Patient  Stated Goals be able to move RUE like LUE   Currently in Pain? Yes   Pain Score 3    Pain Location Arm   Pain Orientation Right   Pain Descriptors / Indicators Discomfort;Tingling;Pins and needles   Pain Type Surgical pain   Pain Onset 1 to 4 weeks ago   Pain Frequency Constant   Aggravating Factors  when she over does something, if she moves it too much   Pain Relieving Factors putting pillow under arm and resting it   Effect of Pain on Daily Activities putting on shoes, washing back, fixing hair            [facility] PT Assessment - 05/22/15 0001    Assessment   Medical Diagnosis right breast cancer   Onset Date/Surgical Date 05/01/15   Hand Dominance Right   Prior Therapy none   Precautions   Precautions Other (comment)  at risk for lymphedema   Restrictions   Weight Bearing Restrictions No   Balance Screen   Has the patient fallen in the past 6 months No   Has the patient had a decrease in activity level because of a fear of falling?  No   Is the patient reluctant to leave their home because of a fear of falling?  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, put out merchandise   Vocation Requirements lifting clothes   Leisure pt does not exercise   Cognition   Overall Cognitive Status Within Functional Limits for tasks assessed   ROM / Strength   AROM / PROM / Strength Strength;AROM   AROM   Right Shoulder Extension 45 Degrees   Right Shoulder Flexion 79 Degrees   Right Shoulder ABduction 96 Degrees   Right Shoulder Internal Rotation 26 Degrees   Right Shoulder External  Rotation 65 Degrees   Left Shoulder Extension 65 Degrees   Left Shoulder Flexion 150 Degrees   Left Shoulder ABduction 160 Degrees   Left Shoulder Internal Rotation 65 Degrees   Left Shoulder External Rotation 86 Degrees   Strength   Overall Strength Unable to assess;Due to pain  unable to assess on R, L grossly 4/5           LYMPHEDEMA/ONCOLOGY QUESTIONNAIRE - 05/22/15 1319    Right Upper Extremity Lymphedema   At Axilla  35 cm   15 cm Proximal to Olecranon Process 31.2 cm   Olecranon Process 26.5 cm   15 cm Proximal to Ulnar Styloid Process 26 cm   Just Proximal to Ulnar Styloid Process 16.5 cm   Across Hand at [employer] 21.1 cm   At [place] of 2nd Digit 6.1 cm   Left Upper Extremity Lymphedema   At Axilla  35 cm   15 cm Proximal to Olecranon Process 29.4 cm   Olecranon Process 26 cm   15 cm Proximal to Ulnar Styloid Process 23.4 cm   Just Proximal to Ulnar Styloid Process 16 cm   Across Hand at [employer] 19.5 cm   At [place] of 2nd Digit 6 cm           Quick Dash - 05/22/15 0001    Open a tight or new jar Severe difficulty   Do heavy household chores (wash walls, wash floors) Moderate difficulty   Carry a shopping bag or briefcase Moderate difficulty   Wash your back Unable   Use a knife to cut food No difficulty   Recreational activities in which you take some force or impact through your arm, shoulder, or hand (golf, hammering, tennis) Severe difficulty   During the past week, to what extent has your arm, shoulder or hand problem interfered with your normal social activities with family, friends, neighbors, or groups? Modererately   During the past week, to what extent has your arm, shoulder or hand problem limited your work or other regular daily activities Quite a bit   Arm, shoulder, or hand pain. Moderate   Tingling (pins and needles) in your arm, shoulder, or hand Moderate   Difficulty Sleeping Moderate difficulty   DASH Score 56.82 %              [facility] Adult PT Treatment/Exercise - 05/22/15 0001    Shoulder Exercises: Supine   Flexion AAROM;Other (comment);Right  x 2 reps with 15 sec holds with dowel   ABduction AAROM;Both;Other (comment)  x 2 reps with 15 sec holds using dowel  PT Education - 05/22/15 1402    Education provided Yes   Education Details supine shoulder ROM exercises with dowel   Person(s) Educated Patient   Methods Explanation;Demonstration;Handout   Comprehension Verbalized understanding;Returned demonstration           Short Term Clinic Goals - 05/22/15 1352    CC Short Term Goal  #1   Title Pt to demonstrate 100 degrees of right shoulder flexion to allow pt to fix hair   Baseline 79   Time 4   Period Weeks   Status New   CC Short Term Goal  #2   Title Pt to demonstrate 120 degrees of right shoulder abduction to allow pt to reach items out to sides   Baseline 96   Time 4   Period Weeks   Status New   CC Short Term Goal  #3   Title Pt to demonstrate 40 degrees of right shoulder IR to allow pt to fasten bra   Baseline 26   Time 4   Period Weeks   Status New   CC Short Term Goal  #4   Title Pt to be able to state lymphedema risk reduction practices to reduce risk of lymphedema   Baseline na   Time 4   Period Days   Status New             Long Term Clinic Goals - 05/22/15 1354    CC Long Term Goal  #1   Title Pt to demonstrate 160 degrees of right shoulder flexion to allow pt to attain position necessary for radiation   Baseline 79   Time 8   Period Weeks   Status New   CC Long Term Goal  #2   Title Pt to demonstrate 160 degrees of right shoulder abduction to allow pt to attain position necessary for radiation   Baseline 96   Time 8   Period Weeks   Status New   CC Long Term Goal  #3   Title Pt to demonstrate 65 degrees of right shoulder IR to allow pt to return to PLOF   Baseline 26   Time 8   Period Weeks   Status New   CC Long Term Goal  #4    Title Pt to demonstrate overall RUE strength of 5/5 to allow pt to return to prior level of activity and lift clothes at work   Baseline unable to assess secondary to pain   Time 8   Period Weeks   Status New   CC Long Term Goal  #5   Title Pt to be independent in home exercise program for continued strengthening and stretching   Baseline na   Time 8   Period Weeks   Status New            Plan - 05/22/15 1342    Clinical Impression Statement Pt presents to clinic with hx of right sided breast cancer with subsequent lumpectomy and lymph node dissection (14). She now presents with decreased right shoulder range of motion and strength. Cording can be palpated from right axila to right antecubital fossa which is limiting pts abiliy to achieve normal ROM. At this time pt does not report any edema in right UE but does have slight increase in circumferential measurement at right forearm compared to left which will be monitored while she is in therapy.    Pt will benefit from skilled therapeutic intervention in order to improve on the following deficits Increased  edema;Pain;Impaired UE functional use;Increased fascial restricitons;Decreased strength;Impaired perceived functional ability;Decreased range of motion   Rehab Potential Excellent   Clinical Impairments Affecting Rehab Potential pt will be working full time and will be beginning radiation in mid April   PT Frequency 2x / week   PT Duration 8 weeks   PT Treatment/Interventions Passive range of motion;Taping;Therapeutic exercise;Manual techniques;Manual lymph drainage   PT Next Visit Plan re assess right forearm edema, begin PROM/AAROM/AROM to right shoulder, manual techniques for cording   PT Home Exercise Plan supine dowel exercises   Consulted and Agree with Plan of Care Patient         Problem List Patient Active Problem List   Diagnosis Date Noted  . 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  Chemotherapy-induced peripheral neuropathy ([place]) 02/13/2015  . Genetic testing 09/30/2014  . Breast cancer of lower-inner quadrant of right female breast ([place]) 09/02/2014    Alexia Freestone 05/22/2015, 2:03 PM  [place], [place], [address] Phone: [phone]   Fax:  [phone]  Name: ZORAIDA BATTISTONI MRN: [id number] Date of Birth: [birth year]   Allyson Sabal, PT 05/22/2015 2:03 PM

## 2015-05-22 NOTE — Patient Instructions (Signed)
Shoulder: Abduction (Supine)    With right arm flat on floor, hold dowel in palm. Slowly move right arm up to side of head by pushing with opposite arm. Do not let elbow bend. Hold _15___ seconds. Repeat __5__ times. Do __2__ sessions per day. CAUTION: Stretch slowly and gently.  Copyright  VHI. All rights reserved.   Shoulder: Flexion (Supine)    With hands shoulder width apart, slowly lower dowel to floor behind head. Do not let elbows bend. Keep back flat. Hold __15__ seconds. Repeat _5___ times. Do __2__ sessions per day. CAUTION: Stretch slowly and gently.  Copyright  VHI. All rights reserved.

## 2015-05-23 ENCOUNTER — Ambulatory Visit: Payer: 59 | Admitting: Physical Therapy

## 2015-05-23 DIAGNOSIS — M25511 Pain in right shoulder: Secondary | ICD-10-CM

## 2015-05-23 DIAGNOSIS — M25611 Stiffness of right shoulder, not elsewhere classified: Secondary | ICD-10-CM

## 2015-05-23 DIAGNOSIS — R29898 Other symptoms and signs involving the musculoskeletal system: Secondary | ICD-10-CM

## 2015-05-23 NOTE — Therapy (Signed)
Brandy Douglas, [place], [address] Phone: [phone]   Fax:  [phone]  Physical Therapy Treatment  Patient Details  Name: Brandy Douglas MRN: [id number] Date of Birth: 08/25/1970 No Data Recorded  Encounter Date: 05/23/2015      PT End of Session - 05/23/15 1236    Visit Number 2   Number of Visits 16   Date for PT Re-Evaluation 07/17/15   PT Start Time 0936   PT Stop Time 1020   PT Time Calculation (min) 44 min   Activity Tolerance Patient tolerated treatment well   Behavior During Therapy [facility] for tasks assessed/performed      Past Medical History  Diagnosis Date  . Breast cancer ([place]) 2016    R LIQ IDC; triple negative  . Anxiety     Past Surgical History  Procedure Laterality Date  . Cholecystectomy    . Tubal ligation    . Portacath placement Left 09/16/2014    Procedure: INSERTION PORT-A-CATH WITH ULTRA SOUND ;  Surgeon: [name], MD;  Location: [place] [facility];  Service: General;  Laterality: Left;  . Breast lumpectomy with radioactive seed and axillary lymph node dissection Right 05/01/2015    Procedure: RIGHT BREAST LUMPECTOMY WITH RADIOACTIVE SEED AND RIGHT AXILLARY LYMPH NODE DISSECTION;  Surgeon: [name], MD;  Location: [facility];  Service: General;  Laterality: Right;  . Port-a-cath removal Left 05/01/2015    Procedure: REMOVAL PORT-A-CATH;  Surgeon: [name], MD;  Location: [facility];  Service: General;  Laterality: Left;    There were no vitals filed for this visit.  Visit Diagnosis:  Limitation of joint motion of right shoulder  Right shoulder pain  RUE weakness      Subjective Assessment - 05/23/15 0938    Subjective Nothing new since yesterday.  Tried the exercises just before bed last night and and the arm felt better when she got up today.   Currently in Pain? Yes   Pain Score 3    Pain Location Arm   Pain  Orientation Right;Posterior;Upper   Pain Descriptors / Indicators Aching   Aggravating Factors  overstretching   Pain Relieving Factors stretching               LYMPHEDEMA/ONCOLOGY QUESTIONNAIRE - 05/23/15 0940    Right Upper Extremity Lymphedema   15 cm Proximal to Olecranon Process 31.1 cm   Olecranon Process 26.3 cm   15 cm Proximal to Ulnar Styloid Process 26.1 cm   Just Proximal to Ulnar Styloid Process 16.7 cm   Across Hand at [employer] 21 cm   At [place] of 2nd Digit 6.3 cm                  [facility] Adult PT Treatment/Exercise - 05/23/15 0001    Self-Care   Self-Care Other Self-Care Comments   Other Self-Care Comments  Patient asked about bras and what type to wear.  Discussed sports bras as often a good option; told patient about [employer] as a resource to try bras and work with her insurance.   Shoulder Exercises: Supine   Flexion AAROM;Other (comment);Right  x 2 reps with 15 sec holds with dowel   ABduction AROM;Right  with dowel, 15 counts x 2   Shoulder Exercises: Seated   Other Seated Exercises backward shoulder rolls x 10 for relaxation at end of session   Manual Therapy   Manual Therapy --  Myofascial Release gentle right UE myofascial pulling with very little motion around 70-80 degrees abduction   Scapular Mobilization in left sidelying for right scapula in to protraction and depression   Passive ROM In supine, right shoulder into IR, ER, abduction, and flexion, gently and with stretch to tolerance with focus on relaxation and getting patient's confidence; also pushing arm into radiation position.                 PT Education - 05/22/15 1402    Education provided Yes   Education Details supine shoulder ROM exercises with dowel   Person(s) Educated Patient   Methods Explanation;Demonstration;Handout   Comprehension Verbalized understanding;Returned demonstration           Short Term Clinic Goals - 05/22/15 1352    CC  Short Term Goal  #1   Title Pt to demonstrate 100 degrees of right shoulder flexion to allow pt to fix hair   Baseline 79   Time 4   Period Weeks   Status New   CC Short Term Goal  #2   Title Pt to demonstrate 120 degrees of right shoulder abduction to allow pt to reach items out to sides   Baseline 96   Time 4   Period Weeks   Status New   CC Short Term Goal  #3   Title Pt to demonstrate 40 degrees of right shoulder IR to allow pt to fasten bra   Baseline 26   Time 4   Period Weeks   Status New   CC Short Term Goal  #4   Title Pt to be able to state lymphedema risk reduction practices to reduce risk of lymphedema   Baseline na   Time 4   Period Days   Status New             Long Term Clinic Goals - 05/22/15 1354    CC Long Term Goal  #1   Title Pt to demonstrate 160 degrees of right shoulder flexion to allow pt to attain position necessary for radiation   Baseline 79   Time 8   Period Weeks   Status New   CC Long Term Goal  #2   Title Pt to demonstrate 160 degrees of right shoulder abduction to allow pt to attain position necessary for radiation   Baseline 96   Time 8   Period Weeks   Status New   CC Long Term Goal  #3   Title Pt to demonstrate 65 degrees of right shoulder IR to allow pt to return to PLOF   Baseline 26   Time 8   Period Weeks   Status New   CC Long Term Goal  #4   Title Pt to demonstrate overall RUE strength of 5/5 to allow pt to return to prior level of activity and lift clothes at work   Baseline unable to assess secondary to pain   Time 8   Period Weeks   Status New   CC Long Term Goal  #5   Title Pt to be independent in home exercise program for continued strengthening and stretching   Baseline na   Time 8   Period Weeks   Status New            Plan - 05/23/15 1236    Clinical Impression Statement Patient appeared nervous today and had some difficulty relaxing for PROM and manual techniques, but was treated gently and at the  end of session,  reported she didn't feel any worse.  She had questions about sharp pains she gets sometimes and about which type bra to wear, which were answered today.  She should do well getting to the goal of getting into position for radiation with gentle approach.  She needed review of her HEP that was instructed last time.  Arm circumferences on right arm were about the same today as on initial evaluation .   Pt will benefit from skilled therapeutic intervention in order to improve on the following deficits Increased edema;Pain;Impaired UE functional use;Increased fascial restricitons;Decreased strength;Impaired perceived functional ability;Decreased range of motion   Rehab Potential Excellent   Clinical Impairments Affecting Rehab Potential pt will be working full time and will be beginning radiation in mid April   PT Frequency 2x / week   PT Duration 8 weeks   PT Treatment/Interventions Manual techniques;Passive range of motion;Patient/family education   PT Next Visit Plan continue P/AA/AROM to right shoulder; monitor right arm circumferences;    PT Home Exercise Plan supine dowel exercises   Consulted and Agree with Plan of Care Patient        Problem List Patient Active Problem List   Diagnosis Date Noted  . 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 Chemotherapy-induced peripheral neuropathy ([place]) 02/13/2015  . Genetic testing 09/30/2014  . Breast cancer of lower-inner quadrant of right female breast ([place]) 09/02/2014    Ligia Duguay 05/23/2015, 12:41 PM  [place], [place], [address] Phone: [phone]   Fax:  [phone]  Name: CHANDREA RAWL MRN: [id number] Date of Birth: 1970-05-09    Serafina Royals, PT 05/23/2015 12:41 PM

## 2015-05-26 ENCOUNTER — Ambulatory Visit: Payer: 59 | Admitting: Physical Therapy

## 2015-05-26 DIAGNOSIS — M25611 Stiffness of right shoulder, not elsewhere classified: Secondary | ICD-10-CM

## 2015-05-26 DIAGNOSIS — M25511 Pain in right shoulder: Secondary | ICD-10-CM

## 2015-05-26 NOTE — Therapy (Signed)
[facility] [address] [place], [place], [address] Phone: [phone]   Fax:  [phone]  Physical Therapy Treatment  Patient Details  Name: Brandy Douglas MRN: [id number] Date of Birth: [birth year] No Data Recorded  Encounter Date: 05/26/2015      PT End of Session - 05/26/15 1220    Visit Number 3   Number of Visits 16   Date for PT Re-Evaluation 07/17/15   PT Start Time 1106   PT Stop Time 1151   PT Time Calculation (min) 45 min   Activity Tolerance Patient tolerated treatment well   Behavior During Therapy [facility] for tasks assessed/performed      Past Medical History  Diagnosis Date  . Breast cancer ([facility]) 2016    R LIQ IDC; triple negative  . Anxiety     Past Surgical History  Procedure Laterality Date  . Cholecystectomy    . Tubal ligation    . Portacath placement Left 09/16/2014    Procedure: INSERTION PORT-A-CATH WITH ULTRA SOUND ;  Surgeon: Ovidio Kin, MD;  Location: [facility];  Service: General;  Laterality: Left;  . Breast lumpectomy with radioactive seed and axillary lymph node dissection Right 05/01/2015    Procedure: RIGHT BREAST LUMPECTOMY WITH RADIOACTIVE SEED AND RIGHT AXILLARY LYMPH NODE DISSECTION;  Surgeon: Ovidio Kin, MD;  Location: [facility];  Service: General;  Laterality: Right;  . Port-a-cath removal Left 05/01/2015    Procedure: REMOVAL PORT-A-CATH;  Surgeon: Ovidio Kin, MD;  Location: [facility];  Service: General;  Laterality: Left;    There were no vitals filed for this visit.  Visit Diagnosis:  Limitation of joint motion of right shoulder  Right shoulder pain      Subjective Assessment - 05/26/15 1107    Subjective Did okay after last session.  Didn't get too sore.  Sent to [employer] and got bras that feel better.             [facility] PT Assessment - 05/26/15 0001    AROM   Right Shoulder Flexion 121 Degrees   Right  Shoulder ABduction 106 Degrees                     [facility] Adult PT Treatment/Exercise - 05/26/15 0001    Self-Care   Other Self-Care Comments  Discussed lymphedema risk reduction briefly.  Discussed where to obtain a compression sleeve.  Discussed patient's return to work next week, what her job entails, and what accommodations might need to be made at first.  Checked arm measurements and was able to fit patient into a class II compression sleeve that had been donated for such a purpose.  Advised her to try wearing it over the weekend for a couple hours at a time.  She had it on here for about 30 minutes and showed no problems with it; she said it felt comfortable.   Manual Therapy   Passive ROM In supine with stretch to tolerance into flexion, abduction, and ER of right shoulder.                PT Education - 05/26/15 1219    Education provided Yes   Education Details about ABC class; about how and where to obtain a compression sleeve; began lymphedema risk reduction education; also how to don/doff compression sleeve   Person(s) Educated Patient   Methods Explanation;Handout  ABC class handouts given   Comprehension Verbalized understanding;Need further  instruction           Short Term Clinic Goals - 05/26/15 1224    CC Short Term Goal  #1   Title Pt to demonstrate 100 degrees of right shoulder flexion to allow pt to fix hair   Status Achieved   CC Short Term Goal  #2   Title Pt to demonstrate 120 degrees of right shoulder abduction to allow pt to reach items out to sides   Status Partially Met   CC Short Term Goal  #3   Title Pt to demonstrate 40 degrees of right shoulder IR to allow pt to fasten bra   Status On-going   CC Short Term Goal  #4   Title Pt to be able to state lymphedema risk reduction practices to reduce risk of lymphedema   Status On-going             [place] Term Clinic Goals - 05/22/15 1354    CC Long Term Goal  #1   Title Pt to  demonstrate 160 degrees of right shoulder flexion to allow pt to attain position necessary for radiation   Baseline 79   Time 8   Period Weeks   Status New   CC Long Term Goal  #2   Title Pt to demonstrate 160 degrees of right shoulder abduction to allow pt to attain position necessary for radiation   Baseline 96   Time 8   Period Weeks   Status New   CC Long Term Goal  #3   Title Pt to demonstrate 65 degrees of right shoulder IR to allow pt to return to PLOF   Baseline 26   Time 8   Period Weeks   Status New   CC Long Term Goal  #4   Title Pt to demonstrate overall RUE strength of 5/5 to allow pt to return to prior level of activity and lift clothes at work   Baseline unable to assess secondary to pain   Time 8   Period Weeks   Status New   CC Long Term Goal  #5   Title Pt to be independent in home exercise program for continued strengthening and stretching   Baseline na   Time 8   Period Weeks   Status New            Plan - 05/26/15 1220    Clinical Impression Statement Patient tolerated stretching better than last time and has already shown nice gains in AROM in right shoulder flexion and abduction.  She expects to go back to work next week so discussion was around her job duties and modifications/accommodations she might need.  She needed a letter sent to her employer about this, so that was done.  She was given a compression sleeve that had been donated; she was also given info about obtaining other sleeves.  She was encouraged to come to ABC class on 05/29/15.   Pt will benefit from skilled therapeutic intervention in order to improve on the following deficits Increased edema;Pain;Impaired UE functional use;Increased fascial restricitons;Decreased strength;Impaired perceived functional ability;Decreased range of motion   Rehab Potential Excellent   Clinical Impairments Affecting Rehab Potential pt will be working full time and will be beginning radiation in mid April    PT Frequency 2x / week   PT Duration 8 weeks   PT Treatment/Interventions ADLs/Self Care Home Management;Patient/family education;Orthotic Fit/Training;Passive range of motion   PT Next Visit Plan continue P/AA/AROM to right shoulder; monitor right arm  circumferences;    PT Home Exercise Plan supine dowel exercises   Recommended Other Services attend ABC class   Consulted and Agree with Plan of Care Patient        Problem List Patient Active Problem List   Diagnosis Date Noted  . 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 Chemotherapy-induced peripheral neuropathy ([place]) 02/13/2015  . Genetic testing 09/30/2014  . Breast cancer of lower-inner quadrant of right female breast ([place]) 09/02/2014    Niyla Marone 05/26/2015, 12:25 PM  [place], [place], [address] Phone: [phone]   Fax:  [phone]  Name: Brandy Douglas MRN: [id number] Date of Birth: [birth year]    Serafina Royals, PT 05/26/2015 12:25 PM

## 2015-05-30 ENCOUNTER — Ambulatory Visit: Payer: 59 | Admitting: Physical Therapy

## 2015-05-31 ENCOUNTER — Ambulatory Visit: Payer: 59

## 2015-05-31 DIAGNOSIS — M25611 Stiffness of right shoulder, not elsewhere classified: Secondary | ICD-10-CM

## 2015-05-31 DIAGNOSIS — M25511 Pain in right shoulder: Secondary | ICD-10-CM

## 2015-05-31 NOTE — Therapy (Signed)
[facility] [address] [place], [place], [address] Phone: [phone]   Fax:  [phone]  Physical Therapy Treatment  Patient Details  Name: Brandy Douglas MRN: [id number] Date of Birth: 10/19/1970 No Data Recorded  Encounter Date: 05/31/2015      PT End of Session - 05/31/15 0932    Visit Number 4   Number of Visits 16   Date for PT Re-Evaluation 07/17/15   PT Start Time 0847   PT Stop Time 0932   PT Time Calculation (min) 45 min   Activity Tolerance Patient tolerated treatment well   Behavior During Therapy [facility] for tasks assessed/performed      Past Medical History  Diagnosis Date  . Breast cancer ([facility]) 2016    R LIQ IDC; triple negative  . Anxiety     Past Surgical History  Procedure Laterality Date  . Cholecystectomy    . Tubal ligation    . Portacath placement Left 09/16/2014    Procedure: INSERTION PORT-A-CATH WITH ULTRA SOUND ;  Surgeon: Ovidio Kin, MD;  Location: [facility];  Service: General;  Laterality: Left;  . Breast lumpectomy with radioactive seed and axillary lymph node dissection Right 05/01/2015    Procedure: RIGHT BREAST LUMPECTOMY WITH RADIOACTIVE SEED AND RIGHT AXILLARY LYMPH NODE DISSECTION;  Surgeon: Ovidio Kin, MD;  Location: [facility];  Service: General;  Laterality: Right;  . Port-a-cath removal Left 05/01/2015    Procedure: REMOVAL PORT-A-CATH;  Surgeon: Ovidio Kin, MD;  Location: [facility];  Service: General;  Laterality: Left;    There were no vitals filed for this visit.  Visit Diagnosis:  Limitation of joint motion of right shoulder  Right shoulder pain      Subjective Assessment - 05/31/15 0853    Subjective I went back to work yesterday and they had me doing a different job that was less labor intensive so it went fine. They got the note from Lupita Leash and that was helpful too.   Pertinent History Right breast mass 5 x 4.8 x 4.2  cm, 6 enlarged axillary lymph nodes including nodes in level III location, T2 N3 M0 stage IIIc clinical stage. Right breast biopsy 6:00: Invasive ductal carcinoma, right axillary lymph node biopsy high-grade carcinoma ER 0% PR 0% HER-2 negative ratio 1.68, Ki-67 80%, grade 3. 05/01/15- Pt underwent right lumpectomy and lymph node dissection (14).   Patient Stated Goals be able to move RUE like LUE   Currently in Pain? No/denies                         [facility] Adult PT Treatment/Exercise - 05/31/15 0001    Shoulder Exercises: Pulleys   Flexion 2 minutes   ABduction 2 minutes   ABduction Limitations Verbal cuing and demo throughout to decrease scapular compensations.    Shoulder Exercises: Therapy Ball   Flexion 10 reps   Shoulder Exercises: Stretch   Other Shoulder Stretches Finger Ladder for Rt UE abduction with demo for correct technique 3 reps only due to ran out of time.   Manual Therapy   Passive ROM In supine with stretch to tolerance into flexion, abduction, D2, and ER of right shoulder.                PT Education - 05/31/15 0932    Education provided Yes   Education Details Cont cane exercises and add wall stretches or use of higher shelf at  work to [employer] day.   Person(s) Educated Patient   Methods Explanation;Demonstration;Tactile cues   Comprehension Verbalized understanding;Returned demonstration           Short Term Clinic Goals - 05/26/15 1224    CC Short Term Goal  #1   Title Pt to demonstrate 100 degrees of right shoulder flexion to allow pt to fix hair   Status Achieved   CC Short Term Goal  #2   Title Pt to demonstrate 120 degrees of right shoulder abduction to allow pt to reach items out to sides   Status Partially Met   CC Short Term Goal  #3   Title Pt to demonstrate 40 degrees of right shoulder IR to allow pt to fasten bra   Status On-going   CC Short Term Goal  #4   Title Pt to be able to state lymphedema  risk reduction practices to reduce risk of lymphedema   Status On-going             [place] Term Clinic Goals - 05/22/15 1354    CC Long Term Goal  #1   Title Pt to demonstrate 160 degrees of right shoulder flexion to allow pt to attain position necessary for radiation   Baseline 79   Time 8   Period Weeks   Status New   CC Long Term Goal  #2   Title Pt to demonstrate 160 degrees of right shoulder abduction to allow pt to attain position necessary for radiation   Baseline 96   Time 8   Period Weeks   Status New   CC Long Term Goal  #3   Title Pt to demonstrate 65 degrees of right shoulder IR to allow pt to return to PLOF   Baseline 26   Time 8   Period Weeks   Status New   CC Long Term Goal  #4   Title Pt to demonstrate overall RUE strength of 5/5 to allow pt to return to prior level of activity and lift clothes at work   Baseline unable to assess secondary to pain   Time 8   Period Weeks   Status New   CC Long Term Goal  #5   Title Pt to be independent in home exercise program for continued strengthening and stretching   Baseline na   Time 8   Period Weeks   Status New            Plan - 05/31/15 3086    Clinical Impression Statement Pt still with a little trouble competely relaxing for PROM but we were able to stretch to the end of her ROM today and she tolerated this well. Began some AAROM stretches today which pt also tolerated well though did need alot of verbal and tactile cuing to decrease scapular compensations with end ROM stretching. Pt able to correct and was instructed to continue working on this in front of mirror at home or work.    Pt will benefit from skilled therapeutic intervention in order to improve on the following deficits Increased edema;Pain;Impaired UE functional use;Increased fascial restricitons;Decreased strength;Impaired perceived functional ability;Decreased range of motion   Rehab Potential Excellent   Clinical Impairments Affecting Rehab  Potential started working full time 05/29/15 and will be beginning radiation in mid April   PT Frequency 2x / week   PT Duration 8 weeks   PT Treatment/Interventions ADLs/Self Care Home Management;Patient/family education;Orthotic Fit/Training;Passive range of motion   PT Next Visit Plan continue  P/AA/AROM to right shoulder; monitor right arm circumferences; add supine scauplar series with yellow theraband??   PT Home Exercise Plan see education section   Consulted and Agree with Plan of Care Patient        Problem List Patient Active Problem List   Diagnosis Date Noted  . 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 Chemotherapy-induced peripheral neuropathy ([place]) 02/13/2015  . Genetic testing 09/30/2014  . Breast cancer of lower-inner quadrant of right female breast ([place]) 09/02/2014    Otelia Limes, PTA 05/31/2015, 9:37 AM  [place], [place], [address] Phone: [phone]   Fax:  [phone]  Name: MEERAB MASELLI MRN: [id number] Date of Birth: February 01, 1971

## 2015-06-01 ENCOUNTER — Ambulatory Visit: Payer: 59

## 2015-06-01 DIAGNOSIS — C50911 Malignant neoplasm of unspecified site of right female breast: Secondary | ICD-10-CM

## 2015-06-01 DIAGNOSIS — M25611 Stiffness of right shoulder, not elsewhere classified: Secondary | ICD-10-CM | POA: Diagnosis not present

## 2015-06-01 DIAGNOSIS — C773 Secondary and unspecified malignant neoplasm of axilla and upper limb lymph nodes: Secondary | ICD-10-CM

## 2015-06-01 DIAGNOSIS — R29898 Other symptoms and signs involving the musculoskeletal system: Secondary | ICD-10-CM

## 2015-06-01 DIAGNOSIS — M25511 Pain in right shoulder: Secondary | ICD-10-CM

## 2015-06-01 NOTE — Therapy (Signed)
[place], [place], [address] Phone: [phone]   Fax:  [phone]  Physical Therapy Treatment  Patient Details  Name: Brandy Douglas MRN: [id number] Date of Birth: 19-Aug-1970 No Data Recorded  Encounter Date: 06/01/2015      PT End of Session - 06/01/15 0824    Visit Number 5   Number of Visits 16   Date for PT Re-Evaluation 07/17/15   PT Start Time 0807   PT Stop Time 0900   PT Time Calculation (min) 53 min   Activity Tolerance Patient tolerated treatment well   Behavior During Therapy [facility] for tasks assessed/performed      Past Medical History  Diagnosis Date  . Breast cancer ([place]) 2016    R LIQ IDC; triple negative  . Anxiety     Past Surgical History  Procedure Laterality Date  . Cholecystectomy    . Tubal ligation    . Portacath placement Left 09/16/2014    Procedure: INSERTION PORT-A-CATH WITH ULTRA SOUND ;  Surgeon: [name], MD;  Location: [place];  Service: General;  Laterality: Left;  . Breast lumpectomy with radioactive seed and axillary lymph node dissection Right 05/01/2015    Procedure: RIGHT BREAST LUMPECTOMY WITH RADIOACTIVE SEED AND RIGHT AXILLARY LYMPH NODE DISSECTION;  Surgeon: [name], MD;  Location: [place];  Service: General;  Laterality: Right;  . Port-a-cath removal Left 05/01/2015    Procedure: REMOVAL PORT-A-CATH;  Surgeon: [name], MD;  Location: [place];  Service: General;  Laterality: Left;    There were no vitals filed for this visit.  Visit Diagnosis:  Limitation of joint motion of right shoulder  Right shoulder pain  RUE weakness  Breast cancer metastasized to axillary lymph node, right (HCC)      Subjective Assessment - 06/01/15 0810    Subjective Nothing since I was here last except I do feel like my ROM keeps getting better.    Pertinent History Right breast mass 5 x 4.8 x 4.2 cm, 6  enlarged axillary lymph nodes including nodes in level III location, T2 N3 M0 stage IIIc clinical stage. Right breast biopsy 6:00: Invasive ductal carcinoma, right axillary lymph node biopsy high-grade carcinoma ER 0% PR 0% HER-2 negative ratio 1.68, Ki-67 80%, grade 3. 05/01/15- Pt underwent right lumpectomy and lymph node dissection (14).   Patient Stated Goals be able to move RUE like LUE   Currently in Pain? No/denies            [facility] PT Assessment - 06/01/15 0001    AROM   Right Shoulder Flexion 120 Degrees   Right Shoulder ABduction 110 Degrees   Right Shoulder Internal Rotation 55 Degrees                     [facility] Adult PT Treatment/Exercise - 06/01/15 0001    Shoulder Exercises: Supine   Horizontal ABduction Strengthening;Both;5 reps;Theraband   Theraband Level (Shoulder Horizontal ABduction) Level 1 (Yellow)   External Rotation Strengthening;Both;5 reps;Theraband   Theraband Level (Shoulder External Rotation) Level 1 (Yellow)   Flexion Strengthening;Both;5 reps;Theraband  Narrow and wide grip   Theraband Level (Shoulder Flexion) Level 1 (Yellow)   Other Supine Exercises D2 bil UE's with yellow theraband 5 reps each   Shoulder Exercises: Pulleys   Flexion 3 minutes   ABduction 2 minutes   ABduction Limitations Verbal cuing to remind pt to decrease scapular compensation.   Shoulder  Exercises: Therapy Ball   Flexion 10 reps   Shoulder Exercises: Stretch   Other Shoulder Stretches Finger Ladder for Rt UE abduction 5 reps. Pt did better with minimal shoulder hiking today.   Manual Therapy   Passive ROM In supine with stretch to tolerance into flexion, abduction, D2, and ER of right shoulder.                PT Education - 06/01/15 0817    Education provided Yes   Education Details Supine scapular series with yellow theraband   Person(s) Educated Patient   Methods Explanation;Demonstration;Handout;Verbal cues   Comprehension Verbalized  understanding;Returned demonstration           Short Term Clinic Goals - 06/01/15 0902    CC Short Term Goal  #1   Title Pt to demonstrate 100 degrees of right shoulder flexion to allow pt to fix hair   Status Achieved   CC Short Term Goal  #2   Title Pt to demonstrate 120 degrees of right shoulder abduction to allow pt to reach items out to sides   Baseline 96; 110 degrees 06/01/15   Status Partially Met   CC Short Term Goal  #3   Title Pt to demonstrate 40 degrees of right shoulder IR to allow pt to fasten bra   Baseline 26; 55 degrees 06/01/15   Status Achieved   CC Short Term Goal  #4   Title Pt to be able to state lymphedema risk reduction practices to reduce risk of lymphedema  Pt plans to attend ABC class   Status On-going             Long Term Clinic Goals - 06/01/15 0903    CC Long Term Goal  #1   Title Pt to demonstrate 160 degrees of right shoulder flexion to allow pt to attain position necessary for radiation   Baseline 79; 120 degrees 06/01/15   Status On-going   CC Long Term Goal  #2   Title Pt to demonstrate 160 degrees of right shoulder abduction to allow pt to attain position necessary for radiation   Status On-going   CC Long Term Goal  #3   Title Pt to demonstrate 65 degrees of right shoulder IR to allow pt to return to PLOF   Baseline 26; 55 degrees 06/01/15   Status On-going   CC Long Term Goal  #4   Title Pt to demonstrate overall RUE strength of 5/5 to allow pt to return to prior level of activity and lift clothes at work  Issued initial gentle HEP for strength today 06/01/15   Status On-going   CC Long Term Goal  #5   Title Pt to be independent in home exercise program for continued strengthening and stretching   Status On-going            Plan - 06/01/15 0825    Clinical Impression Statement Pt did very well with gentle progression of HEP today. No pain with any motions, just could feel some tightness at times. Pt knows not to push any  pain and in fact stop if she has any. Is starting to make good progress with therapy thus far.    Pt will benefit from skilled therapeutic intervention in order to improve on the following deficits Increased edema;Pain;Impaired UE functional use;Increased fascial restricitons;Decreased strength;Impaired perceived functional ability;Decreased range of motion   Rehab Potential Excellent   Clinical Impairments Affecting Rehab Potential started working full time 05/29/15 and will be  beginning radiation in mid April   PT Frequency 2x / week   PT Duration 8 weeks   PT Treatment/Interventions ADLs/Self Care Home Management;Patient/family education;Orthotic Fit/Training;Passive range of motion   PT Next Visit Plan continue P/AA/AROM to right shoulder; monitor right arm circumferences; review HEP issued today   PT Home Exercise Plan Supine scapular series with yellow theraband   Consulted and Agree with Plan of Care Patient        Problem List Patient Active Problem List   Diagnosis Date Noted  . 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 Chemotherapy-induced peripheral neuropathy ([place]) 02/13/2015  . Genetic testing 09/30/2014  . Breast cancer of lower-inner quadrant of right female breast ([place]) 09/02/2014    Otelia Limes, PTA 06/01/2015, 9:08 AM  [place], [place], [address] Phone: [phone]   Fax:  [phone]  Name: TRILBY WAY MRN: [id number] Date of Birth: [birth year]

## 2015-06-01 NOTE — Patient Instructions (Signed)
Over Head Pull: Narrow Grip And Wide Grip    Cancer Rehab [phone]   On back, knees bent, feet flat, band across thighs, elbows straight but relaxed. Pull hands apart (start). Keeping elbows straight, bring arms up and over head, hands toward floor. Keep pull steady on band. Hold momentarily. Return slowly, keeping pull steady, back to start. Then do same with a wide grip on band.  Repeat _5-10__ times. Band color __yellow____   Side Pull: Double Arm   On back, knees bent, feet flat. Arms perpendicular to body, shoulder level, elbows straight but relaxed. Pull arms out to sides, elbows straight. Resistance band comes across collarbones, hands toward floor. Hold momentarily. Slowly return to starting position. Repeat _5-10__ times. Band color _yellow____   Sash   On back, knees bent, feet flat, left hand on left hip, right hand above left. Pull right arm DIAGONALLY (hip to shoulder) across chest. Bring right arm along head toward floor. Hold momentarily. Slowly return to starting position. Repeat _5-10__ times. Do with left arm. Band color _yellow_____   Shoulder Rotation: Double Arm   On back, knees bent, feet flat, elbows tucked at sides, bent 90, hands palms up. Pull hands apart and down toward floor, keeping elbows near sides. Hold momentarily. Slowly return to starting position. Repeat _5-10__ times. Band color _yellow_____

## 2015-06-05 ENCOUNTER — Encounter: Payer: Self-pay | Admitting: Physical Therapy

## 2015-06-05 ENCOUNTER — Ambulatory Visit: Payer: 59 | Admitting: Physical Therapy

## 2015-06-05 DIAGNOSIS — R29898 Other symptoms and signs involving the musculoskeletal system: Secondary | ICD-10-CM

## 2015-06-05 DIAGNOSIS — M25611 Stiffness of right shoulder, not elsewhere classified: Secondary | ICD-10-CM | POA: Diagnosis not present

## 2015-06-05 DIAGNOSIS — M25511 Pain in right shoulder: Secondary | ICD-10-CM

## 2015-06-05 NOTE — Therapy (Signed)
[place], [place], [address] Phone: [phone]   Fax:  [phone]  Physical Therapy Treatment  Patient Details  Name: Brandy Douglas MRN: [id number] Date of Birth: Dec 19, 1970 No Data Recorded  Encounter Date: 06/05/2015      PT End of Session - 06/05/15 1611    Visit Number 6   Number of Visits 16   Date for PT Re-Evaluation 07/17/15   PT Start Time 4401   PT Stop Time 1558   PT Time Calculation (min) 41 min   Activity Tolerance Patient tolerated treatment well   Behavior During Therapy [facility] for tasks assessed/performed      Past Medical History  Diagnosis Date  . Breast cancer ([place]) 2016    R LIQ IDC; triple negative  . Anxiety     Past Surgical History  Procedure Laterality Date  . Cholecystectomy    . Tubal ligation    . Portacath placement Left 09/16/2014    Procedure: INSERTION PORT-A-CATH WITH ULTRA SOUND ;  Surgeon: [name], MD;  Location: [place];  Service: General;  Laterality: Left;  . Breast lumpectomy with radioactive seed and axillary lymph node dissection Right 05/01/2015    Procedure: RIGHT BREAST LUMPECTOMY WITH RADIOACTIVE SEED AND RIGHT AXILLARY LYMPH NODE DISSECTION;  Surgeon: [name], MD;  Location: [place];  Service: General;  Laterality: Right;  . Port-a-cath removal Left 05/01/2015    Procedure: REMOVAL PORT-A-CATH;  Surgeon: [name], MD;  Location: [place];  Service: General;  Laterality: Left;    There were no vitals filed for this visit.  Visit Diagnosis:  Limitation of joint motion of right shoulder  Right shoulder pain  RUE weakness      Subjective Assessment - 06/05/15 1518    Subjective I think I am doing good but I went back to work so I have not been able to exercise like I was supposed to. I have been getting a work out at work.    Pertinent History Right breast mass 5 x 4.8 x 4.2 cm,  6 enlarged axillary lymph nodes including nodes in level III location, T2 N3 M0 stage IIIc clinical stage. Right breast biopsy 6:00: Invasive ductal carcinoma, right axillary lymph node biopsy high-grade carcinoma ER 0% PR 0% HER-2 negative ratio 1.68, Ki-67 80%, grade 3. 05/01/15- Pt underwent right lumpectomy and lymph node dissection (14).   Patient Stated Goals be able to move RUE like LUE   Currently in Pain? No/denies   Pain Score 0-No pain                         [facility] Adult PT Treatment/Exercise - 06/05/15 0001    Shoulder Exercises: Supine   Horizontal ABduction Strengthening;Both;Theraband;10 reps   Theraband Level (Shoulder Horizontal ABduction) Level 1 (Yellow)   External Rotation Strengthening;Both;Theraband;10 reps   Theraband Level (Shoulder External Rotation) Level 1 (Yellow)   Flexion Strengthening;Both;Theraband;10 reps  Narrow and wide grip   Theraband Level (Shoulder Flexion) Level 1 (Yellow)   Other Supine Exercises D2 bil UE's with yellow theraband 5 reps each   Shoulder Exercises: Pulleys   Flexion 3 minutes   ABduction 2 minutes   ABduction Limitations verbal cueing to perform exercise correctly   Shoulder Exercises: Therapy Ball   Flexion 10 reps   ABduction 10 reps  on right    Shoulder Exercises: Stretch   Other Shoulder Stretches Finger  Ladder for Rt UE abduction 5 reps. Pt did better with minimal shoulder hiking today.   Manual Therapy   Passive ROM In supine with stretch to tolerance into flexion, abduction, D2, and ER of right shoulder.                   Short Term Clinic Goals - 06/01/15 0902    CC Short Term Goal  #1   Title Pt to demonstrate 100 degrees of right shoulder flexion to allow pt to fix hair   Status Achieved   CC Short Term Goal  #2   Title Pt to demonstrate 120 degrees of right shoulder abduction to allow pt to reach items out to sides   Baseline 96; 110 degrees 06/01/15   Status Partially Met   CC Short  Term Goal  #3   Title Pt to demonstrate 40 degrees of right shoulder IR to allow pt to fasten bra   Baseline 26; 55 degrees 06/01/15   Status Achieved   CC Short Term Goal  #4   Title Pt to be able to state lymphedema risk reduction practices to reduce risk of lymphedema  Pt plans to attend ABC class   Status On-going             Long Term Clinic Goals - 06/01/15 0903    CC Long Term Goal  #1   Title Pt to demonstrate 160 degrees of right shoulder flexion to allow pt to attain position necessary for radiation   Baseline 79; 120 degrees 06/01/15   Status On-going   CC Long Term Goal  #2   Title Pt to demonstrate 160 degrees of right shoulder abduction to allow pt to attain position necessary for radiation   Status On-going   CC Long Term Goal  #3   Title Pt to demonstrate 65 degrees of right shoulder IR to allow pt to return to PLOF   Baseline 26; 55 degrees 06/01/15   Status On-going   CC Long Term Goal  #4   Title Pt to demonstrate overall RUE strength of 5/5 to allow pt to return to prior level of activity and lift clothes at work  Issued initial gentle HEP for strength today 06/01/15   Status On-going   CC Long Term Goal  #5   Title Pt to be independent in home exercise program for continued strengthening and stretching   Status On-going            Plan - 06/05/15 1612    Clinical Impression Statement Pt required verbal and tactile cueing for home exercises issued last session. At the end she verbalized and was able to demonstrate correctly. She has started working again and has not been as compliant with her home exercises. Pt to begin radiation mid [place].    Pt will benefit from skilled therapeutic intervention in order to improve on the following deficits Increased edema;Pain;Impaired UE functional use;Increased fascial restricitons;Decreased strength;Impaired perceived functional ability;Decreased range of motion   Rehab Potential Excellent   Clinical Impairments  Affecting Rehab Potential started working full time 05/29/15 and will be beginning radiation in mid April   PT Frequency 2x / week   PT Duration 8 weeks   PT Treatment/Interventions ADLs/Self Care Home Management;Patient/family education;Orthotic Fit/Training;Passive range of motion   PT Next Visit Plan continue P/AA/AROM to right shoulder, gentle strengthening; monitor right arm circumferences   PT Home Exercise Plan Supine scapular series with yellow theraband   Consulted and Agree with  Plan of Care Patient        Problem List Patient Active Problem List   Diagnosis Date Noted  . 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 Chemotherapy-induced peripheral neuropathy ([place]) 02/13/2015  . Genetic testing 09/30/2014  . Breast cancer of lower-inner quadrant of right female breast ([place]) 09/02/2014    Alexia Freestone 06/05/2015, 4:15 PM  [place], [place], [address] Phone: [phone]   Fax:  [phone]  Name: Brandy Douglas MRN: [id number] Date of Birth: [birth year]    Allyson Sabal, PT 06/05/2015 4:15 PM

## 2015-06-07 ENCOUNTER — Ambulatory Visit: Payer: 59 | Admitting: Physical Therapy

## 2015-06-19 ENCOUNTER — Ambulatory Visit
Admission: RE | Admit: 2015-06-19 | Discharge: 2015-06-19 | Disposition: A | Discharge status: Home / Self Care | Payer: 59 | Source: Ambulatory Visit | Attending: Radiation Oncology | Admitting: Radiation Oncology

## 2015-06-19 DIAGNOSIS — Z510 Encounter for antineoplastic radiation therapy: Secondary | ICD-10-CM | POA: Diagnosis not present

## 2015-06-19 DIAGNOSIS — C50311 Malignant neoplasm of lower-inner quadrant of right female breast: Secondary | ICD-10-CM

## 2015-06-19 NOTE — Progress Notes (Signed)
  Radiation Oncology         [phone] ________________________________  Name: Brandy Douglas MRN: [id number]  Date: 06/19/2015  DOB: October 05, 1970  SIMULATION AND TREATMENT PLANNING NOTE    Outpatient  DIAGNOSIS:     ICD-9-CM ICD-10-CM   1. Breast cancer of lower-inner quadrant of right female breast ([place]) 174.3 C50.311     NARRATIVE:  The patient was brought to the [facility].  Identity was confirmed.  All relevant records and images related to the planned course of therapy were reviewed.  The patient freely provided informed written consent to proceed with treatment after reviewing the details related to the planned course of therapy. The consent form was witnessed and verified by the simulation staff.    Then, the patient was set-up in a stable reproducible supine position for radiation therapy with her ipsilateral arm over her head, and her upper body secured in a custom-made Vac-lok device.  CT images were obtained.  Surface markings were placed.  The CT images were loaded into the planning software.    TREATMENT PLANNING NOTE: Treatment planning then occurred.  The radiation prescription was entered and confirmed.     A total of 5 medically necessary complex treatment devices were fabricated and supervised by me: 4 fields with MLCs for custom blocks to protect heart, and lungs;  and, a Vac-lok. MORE COMPLEX DEVICES MAY BE MADE IN DOSIMETRY FOR FIELD IN FIELD BEAMS FOR DOSE HOMOGENEITY.  I have requested : 3D Simulation  I have requested a DVH of the following structures: lungs, heart, lumpectomy cavity.    The patient will receive 50 Gy in 25 fractions to the right breast and regional nodes with 4 fields.   This will  be followed by a boost.  Optical Surface Tracking Plan:  Since intensity modulated radiotherapy (IMRT) and 3D conformal radiation treatment methods are predicated on accurate and precise positioning for treatment, intrafraction motion monitoring is  medically necessary to ensure accurate and safe treatment delivery. The ability to quantify intrafraction motion without excessive ionizing radiation dose can only be performed with optical surface tracking. Accordingly, surface imaging offers the opportunity to obtain 3D measurements of patient position throughout IMRT and 3D treatments without excessive radiation exposure. I am ordering optical surface tracking for this patient's upcoming course of radiotherapy.  ________________________________   Reference:  Ursula Alert, J, et al. Surface imaging-based analysis of intrafraction motion for breast radiotherapy patients.Journal of [employer]is, n. 6, nov. 2014. ISSN [id number].  Available at: <http://www.jacmp.org/index.php/jacmp/article/view/4957>.    -----------------------------------  Eppie Gibson, MD

## 2015-06-21 ENCOUNTER — Telehealth: Payer: Self-pay | Admitting: Hematology and Oncology

## 2015-06-21 ENCOUNTER — Encounter: Payer: Self-pay | Admitting: *Deleted

## 2015-06-21 NOTE — Telephone Encounter (Signed)
lvm to inform patient of fu appt set for Aug per 4/12 pof

## 2015-06-23 DIAGNOSIS — Z510 Encounter for antineoplastic radiation therapy: Secondary | ICD-10-CM | POA: Diagnosis not present

## 2015-06-26 ENCOUNTER — Ambulatory Visit
Admission: RE | Admit: 2015-06-26 | Discharge: 2015-06-26 | Disposition: A | Discharge status: Home / Self Care | Payer: 59 | Source: Ambulatory Visit | Attending: Radiation Oncology | Admitting: Radiation Oncology

## 2015-06-26 DIAGNOSIS — Z510 Encounter for antineoplastic radiation therapy: Secondary | ICD-10-CM | POA: Diagnosis not present

## 2015-06-27 ENCOUNTER — Other Ambulatory Visit: Payer: Self-pay

## 2015-06-27 ENCOUNTER — Ambulatory Visit
Admission: RE | Admit: 2015-06-27 | Discharge: 2015-06-27 | Disposition: A | Discharge status: Home / Self Care | Payer: 59 | Source: Ambulatory Visit | Attending: Radiation Oncology | Admitting: Radiation Oncology

## 2015-06-27 ENCOUNTER — Emergency Department (HOSPITAL_BASED_OUTPATIENT_CLINIC_OR_DEPARTMENT_OTHER)
Admission: EM | Admit: 2015-06-27 | Discharge: 2015-06-28 | Disposition: A | Discharge status: Home / Self Care | Payer: 59 | Attending: Emergency Medicine | Admitting: Emergency Medicine

## 2015-06-27 ENCOUNTER — Emergency Department (HOSPITAL_BASED_OUTPATIENT_CLINIC_OR_DEPARTMENT_OTHER): Payer: 59

## 2015-06-27 ENCOUNTER — Encounter (HOSPITAL_BASED_OUTPATIENT_CLINIC_OR_DEPARTMENT_OTHER): Payer: Self-pay | Admitting: *Deleted

## 2015-06-27 DIAGNOSIS — Z853 Personal history of malignant neoplasm of breast: Secondary | ICD-10-CM | POA: Diagnosis not present

## 2015-06-27 DIAGNOSIS — R079 Chest pain, unspecified: Secondary | ICD-10-CM | POA: Insufficient documentation

## 2015-06-27 DIAGNOSIS — Z510 Encounter for antineoplastic radiation therapy: Secondary | ICD-10-CM | POA: Diagnosis not present

## 2015-06-27 DIAGNOSIS — Z7982 Long term (current) use of aspirin: Secondary | ICD-10-CM | POA: Insufficient documentation

## 2015-06-27 DIAGNOSIS — C50311 Malignant neoplasm of lower-inner quadrant of right female breast: Secondary | ICD-10-CM

## 2015-06-27 LAB — CBC WITH DIFFERENTIAL/PLATELET
Basophils Absolute: 0 10*3/uL (ref 0.0–0.1)
Basophils Relative: 0 %
EOS PCT: 1 %
Eosinophils Absolute: 0.1 10*3/uL (ref 0.0–0.7)
HEMATOCRIT: 32.1 % — AB (ref 36.0–46.0)
Hemoglobin: 10.7 g/dL — ABNORMAL LOW (ref 12.0–15.0)
LYMPHS PCT: 25 %
Lymphs Abs: 1.8 10*3/uL (ref 0.7–4.0)
MCH: 29.3 pg (ref 26.0–34.0)
MCHC: 33.3 g/dL (ref 30.0–36.0)
MCV: 87.9 fL (ref 78.0–100.0)
MONO ABS: 0.7 10*3/uL (ref 0.1–1.0)
MONOS PCT: 9 %
NEUTROS ABS: 4.7 10*3/uL (ref 1.7–7.7)
Neutrophils Relative %: 65 %
PLATELETS: 263 10*3/uL (ref 150–400)
RBC: 3.65 MIL/uL — ABNORMAL LOW (ref 3.87–5.11)
RDW: 14.5 % (ref 11.5–15.5)
WBC: 7.3 10*3/uL (ref 4.0–10.5)

## 2015-06-27 LAB — BASIC METABOLIC PANEL
ANION GAP: 7 (ref 5–15)
BUN: 7 mg/dL (ref 6–20)
CO2: 25 mmol/L (ref 22–32)
Calcium: 9 mg/dL (ref 8.9–10.3)
Chloride: 103 mmol/L (ref 101–111)
Creatinine, Ser: 0.59 mg/dL (ref 0.44–1.00)
GFR calc Af Amer: >60 mL/min (ref >60)
GLUCOSE: 110 mg/dL — AB (ref 65–99)
POTASSIUM: 3.6 mmol/L (ref 3.5–5.1)
Sodium: 135 mmol/L (ref 135–145)

## 2015-06-27 LAB — TROPONIN I: Troponin I: <0.03 ng/mL (ref <0.031)

## 2015-06-27 MED ORDER — ASPIRIN 81 MG PO CHEW
324.0000 mg | CHEWABLE_TABLET | Freq: Once | ORAL | Status: AC
  Administered 2015-06-27: 324 mg via ORAL
  Filled 2015-06-27: qty 4

## 2015-06-27 MED ORDER — ALRA NON-METALLIC DEODORANT (RAD-ONC)
1.0000 "application " | Freq: Once | TOPICAL | Status: AC
  Administered 2015-06-27: 1 via TOPICAL

## 2015-06-27 MED ORDER — RADIAPLEXRX EX GEL
Freq: Once | CUTANEOUS | Status: AC
  Administered 2015-06-27: 17:00:00 via TOPICAL

## 2015-06-27 NOTE — ED Notes (Signed)
Patient transported to X-ray 

## 2015-06-27 NOTE — ED Notes (Addendum)
Pt c/o chest t pressure x 12 hrs , denies SOB, n/v, 1st radiation tx  today for breast ca

## 2015-06-27 NOTE — Progress Notes (Signed)

## 2015-06-28 ENCOUNTER — Emergency Department (HOSPITAL_BASED_OUTPATIENT_CLINIC_OR_DEPARTMENT_OTHER): Payer: 59

## 2015-06-28 ENCOUNTER — Ambulatory Visit
Admission: RE | Admit: 2015-06-28 | Discharge: 2015-06-28 | Disposition: A | Discharge status: Home / Self Care | Payer: 59 | Source: Ambulatory Visit | Attending: Radiation Oncology | Admitting: Radiation Oncology

## 2015-06-28 DIAGNOSIS — Z510 Encounter for antineoplastic radiation therapy: Secondary | ICD-10-CM | POA: Diagnosis not present

## 2015-06-28 LAB — TROPONIN I: Troponin I: <0.03 ng/mL (ref <0.031)

## 2015-06-28 MED ORDER — IOPAMIDOL (ISOVUE-370) INJECTION 76%
80.0000 mL | Freq: Once | INTRAVENOUS | Status: AC | PRN
  Administered 2015-06-28: 80 mL via INTRAVENOUS

## 2015-06-28 NOTE — ED Notes (Signed)
Pt walked back from CT

## 2015-06-28 NOTE — ED Provider Notes (Signed)
CSN: [id number]     Arrival date & time 06/27/15  2147 History   First MD Initiated Contact with Patient 06/27/15 2258     Chief Complaint  Patient presents with  . Chest Pain     (Consider location/radiation/quality/duration/timing/severity/associated sxs/prior Treatment) HPI Comments: 45 year old female with a history of breast cancer on radiation who presents with chest pain. Patient states that she has had chest pressure that has been constant for the past 12 hours. The pain briefly improved after taking Tylenol but returned again tonight. The pain is worse with deep inspiration and worse laying flat. No associated fevers, cough/cold symptoms, vomiting, diarrhea, or recent illness. Patient had her first radiation treatment today. No recent travel, OCP use, leg swelling, or history of blood clots. No family history of heart disease.  Patient is a 45 y.o. female presenting with chest pain. The history is provided by the patient.  Chest Pain   Past Medical History  Diagnosis Date  . Breast cancer ([place]) 2016    R LIQ IDC; triple negative  . Anxiety    Past Surgical History  Procedure Laterality Date  . Cholecystectomy    . Tubal ligation    . Portacath placement Left 09/16/2014    Procedure: INSERTION PORT-A-CATH WITH ULTRA SOUND ;  Surgeon: [name], MD;  Location: [place];  Service: General;  Laterality: Left;  . Breast lumpectomy with radioactive seed and axillary lymph node dissection Right 05/01/2015    Procedure: RIGHT BREAST LUMPECTOMY WITH RADIOACTIVE SEED AND RIGHT AXILLARY LYMPH NODE DISSECTION;  Surgeon: [name], MD;  Location: [facility];  Service: General;  Laterality: Right;  . Port-a-cath removal Left 05/01/2015    Procedure: REMOVAL PORT-A-CATH;  Surgeon: [name], MD;  Location: [facility];  Service: General;  Laterality: Left;   Family History  Problem Relation Age of Onset  . Breast cancer Paternal  Grandmother 61  . Diabetes Father   . Hypertension Father   . Other Sister     has had a hysterectomy due to abnormal bleeding  . Other Sister     has had a hysterectomy due to abnormal cell findings - non-cancerous  . Diabetes Paternal Aunt   . Hypertension Paternal Aunt   . Stroke Paternal Aunt   . Heart attack Paternal Uncle   . Cancer Paternal Uncle     unknown type   Social History  Substance Use Topics  . Smoking status: Never Smoker   . Smokeless tobacco: Never Used  . Alcohol Use: Yes     Comment: occ/very rarely   OB History    No data available     Review of Systems  Cardiovascular: Positive for chest pain.   10 Systems reviewed and are negative for acute change except as noted in the HPI.    Allergies  Review of patient's allergies indicates no known allergies.  Home Medications   Prior to Admission medications   Medication Sig Start Date End Date Taking? Authorizing Provider  HYDROcodone-acetaminophen (NORCO/VICODIN) 5-325 MG per tablet Take 1-2 tablets by mouth every 6 (six) hours as needed. 10/31/14   Nicholas Lose, MD  LORazepam (ATIVAN) 0.5 MG tablet Take 1 tablet (0.5 mg total) by mouth at bedtime. Patient not taking: Reported on 05/19/2015 09/09/14   Nicholas Lose, MD  ondansetron (ZOFRAN) 8 MG tablet Take 1 tablet (8 mg total) by mouth 2 (two) times daily as needed. Start on the third day after chemotherapy. Patient not taking:  Reported on 05/19/2015 09/09/14   Nicholas Lose, MD  prochlorperazine (COMPAZINE) 10 MG tablet Take 1 tablet (10 mg total) by mouth every 6 (six) hours as needed (Nausea or vomiting). Patient not taking: Reported on 05/19/2015 09/09/14   Nicholas Lose, MD   BP 120/80 mmHg  Pulse 88  Resp 16  SpO2 97% Physical Exam  Constitutional: She is oriented to person, place, and time. She appears well-developed and well-nourished. No distress.  HENT:  Head: Normocephalic and atraumatic.  Moist mucous membranes  Eyes: Conjunctivae are normal.  Pupils are equal, round, and reactive to light.  Neck: Neck supple.  Cardiovascular: Normal rate, regular rhythm and normal heart sounds.   No murmur heard. Pulmonary/Chest: Effort normal and breath sounds normal.  Abdominal: Soft. Bowel sounds are normal. She exhibits no distension. There is no tenderness.  Musculoskeletal: She exhibits no edema.  Neurological: She is alert and oriented to person, place, and time.  Fluent speech  Skin: Skin is warm and dry.  Psychiatric: Judgment normal.  Flat affect, avoids eye contact  Nursing note and vitals reviewed.   ED Course  Procedures (including critical care time) Labs Review Labs Reviewed  BASIC METABOLIC PANEL - Abnormal; Notable for the following:    Glucose, Bld 110 (*)    All other components within normal limits  CBC WITH DIFFERENTIAL/PLATELET - Abnormal; Notable for the following:    RBC 3.65 (*)    Hemoglobin 10.7 (*)    HCT 32.1 (*)    All other components within normal limits  TROPONIN I  TROPONIN I    Imaging Review Dg Chest 2 View  06/27/2015  CLINICAL DATA:  Chest pressure and upper back pressure for 12 hours. Radiation therapy today for breast cancer. EXAM: CHEST  2 VIEW COMPARISON:  09/16/2014 FINDINGS: Right axillary dissection clips. Prior Port-A-Cath has been removed. The lungs appear clear. Cardiac and mediastinal contours normal. No pleural effusion identified. IMPRESSION: 1. No active cardiopulmonary disease is radiographically apparent. 2. New right axillary clips noted. Electronically Signed   By: Van Clines M.D.   On: 06/27/2015 23:29   Ct Angio Chest Pe W/cm &/or Wo Cm  06/28/2015  CLINICAL DATA:  Chest pressure earlier today. First radiation treatment for breast cancer today. Nonsmoker. EXAM: CT ANGIOGRAPHY CHEST WITH CONTRAST TECHNIQUE: Multidetector CT imaging of the chest was performed using the standard protocol during bolus administration of intravenous contrast. Multiplanar CT image  reconstructions and MIPs were obtained to evaluate the vascular anatomy. CONTRAST:  80 mL Omnipaque 350 COMPARISON:  09/15/2014 FINDINGS: Technically adequate study with moderately good opacification of the central and segmental pulmonary arteries. No focal filling defects are demonstrated. No evidence of significant pulmonary embolus. Normal heart size. Normal caliber thoracic aorta. No aortic dissection. Great vessel origins are patent. No significant lymphadenopathy in the mediastinum. Esophagus is decompressed. Evaluation of lungs is somewhat limited due to respiratory motion artifact. No focal airspace disease or consolidation. Airways appear patent. No pleural effusions. No pneumothorax. Interval resection of previously demonstrated right breast mass with surgical clips in the right breast. Interval resection of right axillary lymph nodes with surgical clips and stranding present, likely postoperative. Diffuse scattered lymph nodes remain in the right axilla without pathologic enlargement. Included portions of the upper abdominal organs are grossly unremarkable. No destructive bone lesions. Review of the MIP images confirms the above findings. IMPRESSION: No evidence of significant pulmonary embolus. No evidence of active pulmonary disease. Electronically Signed   By: Oren Beckmann.D.  On: 06/28/2015 00:57   I have personally reviewed and evaluated these  lab results as part of my medical decision-making. EKG: Sinus rhythm, rate 88, normal axis, no ST segment or T-wave changes to suggest acute ischemia; EKG within normal limits.  EKG Interpretation None     Medications  aspirin chewable tablet 324 mg (324 mg Oral Given 06/27/15 2310)  iopamidol (ISOVUE-370) 76 % injection 80 mL (80 mLs Intravenous Contrast Given 06/28/15 0047)    MDM   Final diagnoses:  Chest pain, unspecified chest pain type   Pt w/ breast cancer on radiation p/w 12 hours of constant chest pain worse with inspiration and  laying flat. On exam she was awake and alert, in no acute distress. Vital signs unremarkable. Normal work of breathing and no abnormal lung sounds. EKG shows sinus rhythm, normal EKG with no ischemia. Gave 325 mg aspirin and obtained above lab work including serial troponins. Labs show mild anemia but otherwise unremarkable. Chest x-ray negative acute. Because of the patient's history of cancer, she is at risk for blood clots. Obtained CTA of the chest to rule out PE. CTA was unremarkable. On reexamination, the patient was comfortable and well-appearing. She did note that her pain seems to improve after belching and her significant other wonders whether she has reflux. I discussed supportive care for reflux symptoms and emphasized importance of follow-up with PCP regarding chest pain. HEART score is </= 3 therefore I feel she is appropriate for discharge. I reviewed return precautions including any worsening symptoms or development of new symptoms. Patient voiced understanding and was discharged in satisfactory condition.   Sharlett Iles, MD 06/28/15 0330

## 2015-06-28 NOTE — ED Notes (Signed)
Patient transported to CT 

## 2015-06-29 ENCOUNTER — Ambulatory Visit
Admission: RE | Admit: 2015-06-29 | Discharge: 2015-06-29 | Disposition: A | Discharge status: Home / Self Care | Payer: 59 | Source: Ambulatory Visit | Attending: Radiation Oncology | Admitting: Radiation Oncology

## 2015-06-29 DIAGNOSIS — Z510 Encounter for antineoplastic radiation therapy: Secondary | ICD-10-CM | POA: Diagnosis not present

## 2015-06-30 ENCOUNTER — Ambulatory Visit
Admission: RE | Admit: 2015-06-30 | Discharge: 2015-06-30 | Disposition: A | Discharge status: Home / Self Care | Payer: 59 | Source: Ambulatory Visit | Attending: Radiation Oncology | Admitting: Radiation Oncology

## 2015-06-30 DIAGNOSIS — Z510 Encounter for antineoplastic radiation therapy: Secondary | ICD-10-CM | POA: Diagnosis not present

## 2015-07-03 ENCOUNTER — Ambulatory Visit
Admission: RE | Admit: 2015-07-03 | Discharge: 2015-07-03 | Disposition: A | Discharge status: Home / Self Care | Payer: 59 | Source: Ambulatory Visit | Attending: Radiation Oncology | Admitting: Radiation Oncology

## 2015-07-03 ENCOUNTER — Encounter: Payer: Self-pay | Admitting: Radiation Oncology

## 2015-07-03 VITALS — BP 108/82 | HR 87 | Temp 97.9°F | Ht 67.0 in | Wt 187.3 lb

## 2015-07-03 DIAGNOSIS — C50311 Malignant neoplasm of lower-inner quadrant of right female breast: Secondary | ICD-10-CM

## 2015-07-03 DIAGNOSIS — Z510 Encounter for antineoplastic radiation therapy: Secondary | ICD-10-CM | POA: Diagnosis not present

## 2015-07-03 NOTE — Progress Notes (Signed)
   Weekly Management Note:  outpatient    ICD-9-CM ICD-10-CM   1. Breast cancer of lower-inner quadrant of right female breast (HCC) 174.3 C50.311     Current Dose:  10 Gy  Projected Dose: 60 Gy   Narrative:  The patient presents for routine under treatment assessment.  CBCT/MVCT images/Port film x-rays were reviewed.  The chart was checked. Doing well. No new complaints  Physical Findings:  height is 5\' 7"  (1.702 m) and weight is 187 lb 4.8 oz (84.959 kg). Her temperature is 97.9 F (36.6 C). Her blood pressure is 108/82 and her pulse is 87.   Wt Readings from Last 3 Encounters:  07/03/15 187 lb 4.8 oz (84.959 kg)  05/19/15 183 lb 9.6 oz (83.28 kg)  05/08/15 176 lb 12.8 oz (80.196 kg)   NAD, no skin changes over right breast  Impression:  The patient is tolerating radiotherapy.  Plan:  Continue radiotherapy as planned.    ________________________________   Eppie Gibson, M.D.

## 2015-07-03 NOTE — Progress Notes (Signed)
Ms. Rolling is here for her 5th fraction of radiation to her Right Breast. She denies fatigue or pain. She has had no changes to her Right Breast related to radiation per her report. She is using the radiaplex as directed.   BP 108/82 mmHg  Pulse 87  Temp(Src) 97.9 F (36.6 C)  Ht 5\' 7"  (1.702 m)  Wt 187 lb 4.8 oz (84.959 kg)  BMI 29.33 kg/m2

## 2015-07-04 ENCOUNTER — Ambulatory Visit
Admission: RE | Admit: 2015-07-04 | Discharge: 2015-07-04 | Disposition: A | Discharge status: Home / Self Care | Payer: 59 | Source: Ambulatory Visit | Attending: Radiation Oncology | Admitting: Radiation Oncology

## 2015-07-04 DIAGNOSIS — Z510 Encounter for antineoplastic radiation therapy: Secondary | ICD-10-CM | POA: Diagnosis not present

## 2015-07-05 ENCOUNTER — Ambulatory Visit
Admission: RE | Admit: 2015-07-05 | Discharge: 2015-07-05 | Disposition: A | Discharge status: Home / Self Care | Payer: 59 | Source: Ambulatory Visit | Attending: Radiation Oncology | Admitting: Radiation Oncology

## 2015-07-05 DIAGNOSIS — Z510 Encounter for antineoplastic radiation therapy: Secondary | ICD-10-CM | POA: Diagnosis not present

## 2015-07-06 ENCOUNTER — Ambulatory Visit
Admission: RE | Admit: 2015-07-06 | Discharge: 2015-07-06 | Disposition: A | Discharge status: Home / Self Care | Payer: 59 | Source: Ambulatory Visit | Attending: Radiation Oncology | Admitting: Radiation Oncology

## 2015-07-06 DIAGNOSIS — Z510 Encounter for antineoplastic radiation therapy: Secondary | ICD-10-CM | POA: Diagnosis not present

## 2015-07-07 ENCOUNTER — Ambulatory Visit
Admission: RE | Admit: 2015-07-07 | Discharge: 2015-07-07 | Disposition: A | Discharge status: Home / Self Care | Payer: 59 | Source: Ambulatory Visit | Attending: Radiation Oncology | Admitting: Radiation Oncology

## 2015-07-07 DIAGNOSIS — Z510 Encounter for antineoplastic radiation therapy: Secondary | ICD-10-CM | POA: Diagnosis not present

## 2015-07-10 ENCOUNTER — Ambulatory Visit
Admission: RE | Admit: 2015-07-10 | Discharge: 2015-07-10 | Disposition: A | Discharge status: Home / Self Care | Payer: 59 | Source: Ambulatory Visit | Attending: Radiation Oncology | Admitting: Radiation Oncology

## 2015-07-10 ENCOUNTER — Encounter: Payer: Self-pay | Admitting: *Deleted

## 2015-07-10 ENCOUNTER — Encounter: Payer: Self-pay | Admitting: Radiation Oncology

## 2015-07-10 VITALS — BP 115/78 | HR 99 | Temp 98.0°F | Ht 67.0 in | Wt 187.7 lb

## 2015-07-10 DIAGNOSIS — C50311 Malignant neoplasm of lower-inner quadrant of right female breast: Secondary | ICD-10-CM

## 2015-07-10 DIAGNOSIS — Z510 Encounter for antineoplastic radiation therapy: Secondary | ICD-10-CM | POA: Diagnosis not present

## 2015-07-10 NOTE — Progress Notes (Addendum)
Ms. Imdieke presents for her 10th fraction of radiation to her Right Breast. She denies fatigue or pain at this time. She has some slight hyperpigmentation to her Right axilla and her supraclavicular area. She is using the radiaplex cream as directed.   BP 115/78 mmHg  Pulse 99  Temp(Src) 98 F (36.7 C)  Ht 5\' 7"  (1.702 m)  Wt 187 lb 11.2 oz (85.14 kg)  BMI 29.39 kg/m2

## 2015-07-10 NOTE — Progress Notes (Signed)
   Weekly Management Note:  outpatient    ICD-9-CM ICD-10-CM   1. Breast cancer of lower-inner quadrant of right female breast (HCC) 174.3 C50.311     Current Dose:  20 Gy  Projected Dose: 60 Gy   Narrative:  The patient presents for routine under treatment assessment.  CBCT/MVCT images/Port film x-rays were reviewed.  The chart was checked. Doing well. No new complaints  Physical Findings:  height is 5\' 7"  (1.702 m) and weight is 187 lb 11.2 oz (85.14 kg). Her temperature is 98 F (36.7 C). Her blood pressure is 115/78 and her pulse is 99.   Wt Readings from Last 3 Encounters:  07/10/15 187 lb 11.2 oz (85.14 kg)  07/03/15 187 lb 4.8 oz (84.959 kg)  05/19/15 183 lb 9.6 oz (83.28 kg)   Mild hyperpigmentation over right breast/axilla  Impression:  The patient is tolerating radiotherapy.  Plan:  Continue radiotherapy as planned.    ________________________________   Eppie Gibson, M.D.

## 2015-07-11 ENCOUNTER — Ambulatory Visit
Admission: RE | Admit: 2015-07-11 | Discharge: 2015-07-11 | Disposition: A | Discharge status: Home / Self Care | Payer: 59 | Source: Ambulatory Visit | Attending: Radiation Oncology | Admitting: Radiation Oncology

## 2015-07-11 DIAGNOSIS — Z510 Encounter for antineoplastic radiation therapy: Secondary | ICD-10-CM | POA: Diagnosis not present

## 2015-07-12 ENCOUNTER — Ambulatory Visit
Admission: RE | Admit: 2015-07-12 | Discharge: 2015-07-12 | Disposition: A | Discharge status: Home / Self Care | Payer: 59 | Source: Ambulatory Visit | Attending: Radiation Oncology | Admitting: Radiation Oncology

## 2015-07-12 DIAGNOSIS — Z510 Encounter for antineoplastic radiation therapy: Secondary | ICD-10-CM | POA: Diagnosis not present

## 2015-07-13 ENCOUNTER — Ambulatory Visit
Admission: RE | Admit: 2015-07-13 | Discharge: 2015-07-13 | Disposition: A | Discharge status: Home / Self Care | Payer: 59 | Source: Ambulatory Visit | Attending: Radiation Oncology | Admitting: Radiation Oncology

## 2015-07-13 DIAGNOSIS — Z510 Encounter for antineoplastic radiation therapy: Secondary | ICD-10-CM | POA: Diagnosis not present

## 2015-07-14 ENCOUNTER — Ambulatory Visit
Admission: RE | Admit: 2015-07-14 | Discharge: 2015-07-14 | Disposition: A | Discharge status: Home / Self Care | Payer: 59 | Source: Ambulatory Visit | Attending: Radiation Oncology | Admitting: Radiation Oncology

## 2015-07-14 DIAGNOSIS — Z510 Encounter for antineoplastic radiation therapy: Secondary | ICD-10-CM | POA: Diagnosis not present

## 2015-07-17 ENCOUNTER — Encounter: Payer: Self-pay | Admitting: Radiation Oncology

## 2015-07-17 ENCOUNTER — Ambulatory Visit
Admission: RE | Admit: 2015-07-17 | Discharge: 2015-07-17 | Disposition: A | Discharge status: Home / Self Care | Payer: 59 | Source: Ambulatory Visit | Attending: Radiation Oncology | Admitting: Radiation Oncology

## 2015-07-17 VITALS — BP 116/79 | HR 85 | Temp 98.0°F | Resp 20 | Wt 183.6 lb

## 2015-07-17 DIAGNOSIS — Z510 Encounter for antineoplastic radiation therapy: Secondary | ICD-10-CM | POA: Diagnosis not present

## 2015-07-17 DIAGNOSIS — C50311 Malignant neoplasm of lower-inner quadrant of right female breast: Secondary | ICD-10-CM

## 2015-07-17 NOTE — Progress Notes (Signed)
   Weekly Management Note:  outpatient    ICD-9-CM ICD-10-CM   1. Breast cancer of lower-inner quadrant of right female breast (HCC) 174.3 C50.311     Current Dose: 30 Gy  Projected Dose: 60 Gy   Narrative:  The patient presents for routine under treatment assessment.  CBCT/MVCT images/Port film x-rays were reviewed.  The chart was checked. Doing well. No new complaints  Physical Findings:  weight is 183 lb 9.6 oz (83.28 kg). Her oral temperature is 98 F (36.7 C). Her blood pressure is 116/79 and her pulse is 85. Her respiration is 20.   Wt Readings from Last 3 Encounters:  07/17/15 183 lb 9.6 oz (83.28 kg)  07/10/15 187 lb 11.2 oz (85.14 kg)  07/03/15 187 lb 4.8 oz (84.959 kg)   modest hyperpigmentation over right breast/axilla - skin is intact  Impression:  The patient is tolerating radiotherapy.  Plan:  Continue radiotherapy as planned.    ________________________________   Eppie Gibson, M.D.

## 2015-07-17 NOTE — Progress Notes (Signed)
Weekly rad txs right breast 15/30 compleetd, no skin changes, skin is intact, uses radiaplex bid, no pain, appetite good, energy level good 8:23 AM BP 116/79 mmHg  Pulse 85  Temp(Src) 98 F (36.7 C) (Oral)  Resp 20  Wt 183 lb 9.6 oz (83.28 kg)  Wt Readings from Last 3 Encounters:  07/17/15 183 lb 9.6 oz (83.28 kg)  07/10/15 187 lb 11.2 oz (85.14 kg)  07/03/15 187 lb 4.8 oz (84.959 kg)

## 2015-07-18 ENCOUNTER — Ambulatory Visit
Admission: RE | Admit: 2015-07-18 | Discharge: 2015-07-18 | Disposition: A | Discharge status: Home / Self Care | Payer: 59 | Source: Ambulatory Visit | Attending: Radiation Oncology | Admitting: Radiation Oncology

## 2015-07-18 DIAGNOSIS — Z510 Encounter for antineoplastic radiation therapy: Secondary | ICD-10-CM | POA: Diagnosis not present

## 2015-07-19 ENCOUNTER — Ambulatory Visit
Admission: RE | Admit: 2015-07-19 | Discharge: 2015-07-19 | Disposition: A | Discharge status: Home / Self Care | Payer: 59 | Source: Ambulatory Visit | Attending: Radiation Oncology | Admitting: Radiation Oncology

## 2015-07-19 DIAGNOSIS — Z510 Encounter for antineoplastic radiation therapy: Secondary | ICD-10-CM | POA: Diagnosis not present

## 2015-07-20 ENCOUNTER — Ambulatory Visit
Admission: RE | Admit: 2015-07-20 | Discharge: 2015-07-20 | Disposition: A | Discharge status: Home / Self Care | Payer: 59 | Source: Ambulatory Visit | Attending: Radiation Oncology | Admitting: Radiation Oncology

## 2015-07-20 DIAGNOSIS — Z510 Encounter for antineoplastic radiation therapy: Secondary | ICD-10-CM | POA: Diagnosis not present

## 2015-07-21 ENCOUNTER — Ambulatory Visit
Admission: RE | Admit: 2015-07-21 | Discharge: 2015-07-21 | Disposition: A | Discharge status: Home / Self Care | Payer: 59 | Source: Ambulatory Visit | Attending: Radiation Oncology | Admitting: Radiation Oncology

## 2015-07-21 DIAGNOSIS — Z510 Encounter for antineoplastic radiation therapy: Secondary | ICD-10-CM | POA: Diagnosis not present

## 2015-07-24 ENCOUNTER — Ambulatory Visit
Admission: RE | Admit: 2015-07-24 | Discharge: 2015-07-24 | Disposition: A | Discharge status: Home / Self Care | Payer: 59 | Source: Ambulatory Visit | Attending: Radiation Oncology | Admitting: Radiation Oncology

## 2015-07-24 ENCOUNTER — Encounter: Payer: Self-pay | Admitting: Radiation Oncology

## 2015-07-24 VITALS — BP 112/78 | HR 94 | Temp 98.0°F | Ht 67.0 in | Wt 183.9 lb

## 2015-07-24 DIAGNOSIS — Z510 Encounter for antineoplastic radiation therapy: Secondary | ICD-10-CM | POA: Diagnosis not present

## 2015-07-24 DIAGNOSIS — C50311 Malignant neoplasm of lower-inner quadrant of right female breast: Secondary | ICD-10-CM

## 2015-07-24 NOTE — Progress Notes (Signed)
Brandy Douglas is here for her 20th fraction of radiation to her Right Breast. She denies fatigue. She does have some tenderness/soreness to her Right Breast, and some swelling. She has hyperpigmentation to her Right Breast, underneath her Right breast, and over her clavicle area. She does report some itching at time. I have suggested Hydrocortisone cream. She is using the Radiaplex as directed twice daily.  BP 112/78 mmHg  Pulse 94  Temp(Src) 98 F (36.7 C)  Ht 5\' 7"  (1.702 m)  Wt 183 lb 14.4 oz (83.416 kg)  BMI 28.80 kg/m2

## 2015-07-24 NOTE — Progress Notes (Signed)
   Weekly Management Note:  outpatient    ICD-9-CM ICD-10-CM   1. Breast cancer of lower-inner quadrant of right female breast (HCC) 174.3 C50.311     Current Dose: 40 Gy  Projected Dose: 60 Gy   Narrative:  The patient presents for routine under treatment assessment.  CBCT/MVCT images/Port film x-rays were reviewed.  The chart was checked. Doing well. Some itching and skin changes  Physical Findings:  height is 5\' 7"  (1.702 m) and weight is 183 lb 14.4 oz (83.416 kg). Her temperature is 98 F (36.7 C). Her blood pressure is 112/78 and her pulse is 94.   Wt Readings from Last 3 Encounters:  07/24/15 183 lb 14.4 oz (83.416 kg)  07/17/15 183 lb 9.6 oz (83.28 kg)  07/10/15 187 lb 11.2 oz ([id number] kg)   follicular hyperpigmentation over right breast/axilla - skin is intact  Impression:  The patient is tolerating radiotherapy.  Plan:  Continue radiotherapy as planned.   hydrocortisone 1% cream PRN itching  ________________________________   Eppie Gibson, M.D.

## 2015-07-25 ENCOUNTER — Ambulatory Visit
Admission: RE | Admit: 2015-07-25 | Discharge: 2015-07-25 | Disposition: A | Discharge status: Home / Self Care | Payer: 59 | Source: Ambulatory Visit | Attending: Radiation Oncology | Admitting: Radiation Oncology

## 2015-07-25 DIAGNOSIS — Z510 Encounter for antineoplastic radiation therapy: Secondary | ICD-10-CM | POA: Diagnosis not present

## 2015-07-26 ENCOUNTER — Encounter: Payer: Self-pay | Admitting: Radiation Oncology

## 2015-07-26 ENCOUNTER — Ambulatory Visit
Admission: RE | Admit: 2015-07-26 | Discharge: 2015-07-26 | Disposition: A | Discharge status: Home / Self Care | Payer: 59 | Source: Ambulatory Visit | Attending: Radiation Oncology | Admitting: Radiation Oncology

## 2015-07-26 DIAGNOSIS — Z510 Encounter for antineoplastic radiation therapy: Secondary | ICD-10-CM | POA: Diagnosis not present

## 2015-07-27 ENCOUNTER — Ambulatory Visit
Admission: RE | Admit: 2015-07-27 | Discharge: 2015-07-27 | Disposition: A | Discharge status: Home / Self Care | Payer: 59 | Source: Ambulatory Visit | Attending: Radiation Oncology | Admitting: Radiation Oncology

## 2015-07-27 DIAGNOSIS — Z510 Encounter for antineoplastic radiation therapy: Secondary | ICD-10-CM | POA: Diagnosis not present

## 2015-07-28 ENCOUNTER — Ambulatory Visit
Admission: RE | Admit: 2015-07-28 | Discharge: 2015-07-28 | Disposition: A | Discharge status: Home / Self Care | Payer: 59 | Source: Ambulatory Visit | Attending: Radiation Oncology | Admitting: Radiation Oncology

## 2015-07-28 DIAGNOSIS — Z510 Encounter for antineoplastic radiation therapy: Secondary | ICD-10-CM | POA: Diagnosis not present

## 2015-07-31 ENCOUNTER — Ambulatory Visit
Admission: RE | Admit: 2015-07-31 | Discharge: 2015-07-31 | Disposition: A | Discharge status: Home / Self Care | Payer: 59 | Source: Ambulatory Visit | Attending: Radiation Oncology | Admitting: Radiation Oncology

## 2015-07-31 ENCOUNTER — Encounter: Payer: Self-pay | Admitting: Radiation Oncology

## 2015-07-31 VITALS — BP 115/81 | HR 96 | Temp 97.6°F | Ht 67.0 in | Wt 185.9 lb

## 2015-07-31 DIAGNOSIS — Z923 Personal history of irradiation: Secondary | ICD-10-CM | POA: Diagnosis not present

## 2015-07-31 DIAGNOSIS — C50311 Malignant neoplasm of lower-inner quadrant of right female breast: Secondary | ICD-10-CM

## 2015-07-31 DIAGNOSIS — L819 Disorder of pigmentation, unspecified: Secondary | ICD-10-CM | POA: Diagnosis not present

## 2015-07-31 DIAGNOSIS — Z510 Encounter for antineoplastic radiation therapy: Secondary | ICD-10-CM | POA: Diagnosis not present

## 2015-07-31 MED ORDER — RADIAPLEXRX EX GEL
Freq: Once | CUTANEOUS | Status: AC
  Administered 2015-07-31: 17:00:00 via TOPICAL

## 2015-07-31 NOTE — Progress Notes (Signed)
Ms. Demchak is here for her 25th fraction of radiation to her Right Breast. Her Right breast is hyperpigmented. She also has hyperpigmentation visible at her Axilla, Clavicle area, and the Center of her chest between her Breasts. She is using the radiaplex cream as directed, and was provided with a second tube today. She denies fatigue, but is taking a nap in the afternoon.  BP 115/81 mmHg  Pulse 96  Temp(Src) 97.6 F (36.4 C)  Ht 5\' 7"  (1.702 m)  Wt 185 lb 14.4 oz (84.324 kg)  BMI 29.11 kg/m2   Wt Readings from Last 3 Encounters:  07/31/15 185 lb 14.4 oz (84.324 kg)  07/24/15 183 lb 14.4 oz (83.416 kg)  07/17/15 183 lb 9.6 oz (83.28 kg)

## 2015-07-31 NOTE — Progress Notes (Signed)
   Weekly Management Note:  outpatient    ICD-9-CM ICD-10-CM   1. Breast cancer of lower-inner quadrant of right female breast (HCC) 174.3 C50.311 hyaluronate sodium (RADIAPLEXRX) gel    Current Dose: 50 Gy  Projected Dose: 60 Gy   Narrative:  The patient presents for routine under treatment assessment.  CBCT/MVCT images/Port film x-rays were reviewed.  The chart was checked. Doing well. Skin more hyperpigmented.   Physical Findings:  height is 5\' 7"  (1.702 m) and weight is 185 lb 14.4 oz (84.324 kg). Her temperature is 97.6 F (36.4 C). Her blood pressure is 115/81 and her pulse is 96.   Wt Readings from Last 3 Encounters:  07/31/15 185 lb 14.4 oz (84.324 kg)  07/24/15 183 lb 14.4 oz (83.416 kg)  07/17/15 183 lb 9.6 oz (83.28 kg)   Significant follicular hyperpigmentation over right breast/axilla - skin is intact  Impression:  The patient is tolerating radiotherapy.  Plan:  Continue radiotherapy as planned.   hydrocortisone 1% cream PRN itching.  Radiaplex refilled.  ________________________________   Eppie Gibson, M.D.

## 2015-08-01 ENCOUNTER — Ambulatory Visit
Admission: RE | Admit: 2015-08-01 | Discharge: 2015-08-01 | Disposition: A | Discharge status: Home / Self Care | Payer: 59 | Source: Ambulatory Visit | Attending: Radiation Oncology | Admitting: Radiation Oncology

## 2015-08-01 DIAGNOSIS — Z510 Encounter for antineoplastic radiation therapy: Secondary | ICD-10-CM | POA: Diagnosis not present

## 2015-08-02 ENCOUNTER — Ambulatory Visit
Admission: RE | Admit: 2015-08-02 | Discharge: 2015-08-02 | Disposition: A | Discharge status: Home / Self Care | Payer: 59 | Source: Ambulatory Visit | Attending: Radiation Oncology | Admitting: Radiation Oncology

## 2015-08-02 ENCOUNTER — Encounter: Payer: Self-pay | Admitting: Radiation Oncology

## 2015-08-02 VITALS — BP 118/75 | HR 89 | Temp 98.0°F | Ht 67.0 in | Wt 187.1 lb

## 2015-08-02 DIAGNOSIS — C50311 Malignant neoplasm of lower-inner quadrant of right female breast: Secondary | ICD-10-CM

## 2015-08-02 DIAGNOSIS — Z510 Encounter for antineoplastic radiation therapy: Secondary | ICD-10-CM | POA: Diagnosis not present

## 2015-08-02 NOTE — Progress Notes (Signed)
   Weekly Management Note:  outpatient    ICD-9-CM ICD-10-CM   1. Breast cancer of lower-inner quadrant of right female breast ([place]) 174.3 C50.311     Current Dose: 54Gy  Projected Dose: 60 Gy   Narrative:  The patient presents for routine under treatment assessment.  CBCT/MVCT images/Port film x-rays were reviewed.  The chart was checked. Doing well. No new complaints except some itching at nipple  Physical Findings:  height is 5\' 7"  (1.702 m) and weight is 187 lb 1.6 oz (84.868 kg). Her temperature is 98 F (36.7 C). Her blood pressure is 118/75 and her pulse is 89.   Wt Readings from Last 3 Encounters:  08/02/15 187 lb 1.6 oz (84.868 kg)  07/31/15 185 lb 14.4 oz (84.324 kg)  07/24/15 183 lb 14.4 oz (83.416 kg)   Significant follicular hyperpigmentation over right breast/axilla - skin is intact but thinning at IM fold  Impression:  The patient is tolerating radiotherapy.  Plan:  Continue radiotherapy as planned.   hydrocortisone 1% cream PRN itching. Apply neosporin to IM fold.  ________________________________   Eppie Gibson, M.D.

## 2015-08-02 NOTE — Progress Notes (Signed)
Ms. Brandy Douglas is here for her 27th fraction of radiation to her Right Breast. She denies pain or fatigue. Her skin is hyperpigmented and slightly tender. She does complain of itching above her nipple which started this week. She plans to try hydrocortizone cream at home. She is still using the radiaplex cream and was given a tube this past Monday.   BP 118/75 mmHg  Pulse 89  Temp(Src) 98 F (36.7 C)  Ht 5\' 7"  (1.702 m)  Wt 187 lb 1.6 oz (84.868 kg)  BMI 29.30 kg/m2

## 2015-08-03 ENCOUNTER — Ambulatory Visit
Admission: RE | Admit: 2015-08-03 | Discharge: 2015-08-03 | Disposition: A | Discharge status: Home / Self Care | Payer: 59 | Source: Ambulatory Visit | Attending: Radiation Oncology | Admitting: Radiation Oncology

## 2015-08-03 DIAGNOSIS — Z510 Encounter for antineoplastic radiation therapy: Secondary | ICD-10-CM | POA: Diagnosis not present

## 2015-08-04 ENCOUNTER — Ambulatory Visit
Admission: RE | Admit: 2015-08-04 | Discharge: 2015-08-04 | Disposition: A | Discharge status: Home / Self Care | Payer: 59 | Source: Ambulatory Visit | Attending: Radiation Oncology | Admitting: Radiation Oncology

## 2015-08-04 DIAGNOSIS — Z510 Encounter for antineoplastic radiation therapy: Secondary | ICD-10-CM | POA: Diagnosis not present

## 2015-08-08 ENCOUNTER — Ambulatory Visit
Admission: RE | Admit: 2015-08-08 | Discharge: 2015-08-08 | Disposition: A | Discharge status: Home / Self Care | Payer: 59 | Source: Ambulatory Visit | Attending: Radiation Oncology | Admitting: Radiation Oncology

## 2015-08-08 ENCOUNTER — Encounter: Payer: Self-pay | Admitting: Radiation Oncology

## 2015-08-08 DIAGNOSIS — Z510 Encounter for antineoplastic radiation therapy: Secondary | ICD-10-CM | POA: Diagnosis not present

## 2015-08-10 ENCOUNTER — Telehealth: Payer: Self-pay | Admitting: *Deleted

## 2015-08-10 NOTE — Telephone Encounter (Signed)
  Oncology Nurse Navigator Documentation    Navigator Encounter Type: Telephone (08/10/15 1500) Telephone: Outgoing Call (08/10/15 1500)         Patient Visit Type: RadOnc (08/10/15 1500) Treatment Phase: Final Radiation Tx (08/10/15 1500)  Called pt to congratulate on completion of xrt and to discuss survivorship program.  Unable to leave msg. Will call again.                          Time Spent with Patient: 15 (08/10/15 1500)

## 2015-08-11 ENCOUNTER — Other Ambulatory Visit: Payer: Self-pay | Admitting: Adult Health

## 2015-08-11 DIAGNOSIS — C50311 Malignant neoplasm of lower-inner quadrant of right female breast: Secondary | ICD-10-CM

## 2015-08-11 NOTE — Progress Notes (Signed)
  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     Plan: The patient has completed radiation treatment. The patient will return to radiation oncology clinic for routine followup in one month. I advised them to call or return sooner if they have any questions or concerns related to their recovery or treatment.  -----------------------------------  Eppie Gibson, MD  This document serves as a record of services personally performed by Eppie Gibson, MD. It was created on her behalf by Derek Mound, a trained medical scribe. The creation of this record is based on the scribe's personal observations and the provider's statements to them. This document has been checked and approved by the attending provider.

## 2015-08-17 ENCOUNTER — Telehealth: Payer: Self-pay | Admitting: *Deleted

## 2015-08-17 NOTE — Telephone Encounter (Signed)
  Oncology Nurse Navigator Documentation    Navigator Encounter Type: Telephone (08/17/15 1100) Telephone: Lahoma Crocker Call;Appt Confirmation/Clarification (08/17/15 1100)                              Acuity: Level 1 (08/17/15 1100)         Time Spent with Patient: 15 (08/17/15 1100)

## 2015-09-11 ENCOUNTER — Encounter: Payer: Self-pay | Admitting: Radiation Oncology

## 2015-09-15 ENCOUNTER — Encounter: Payer: Self-pay | Admitting: Radiation Oncology

## 2015-09-15 ENCOUNTER — Ambulatory Visit
Admission: RE | Admit: 2015-09-15 | Discharge: 2015-09-15 | Disposition: A | Discharge status: Home / Self Care | Payer: 59 | Source: Ambulatory Visit | Attending: Radiation Oncology | Admitting: Radiation Oncology

## 2015-09-15 VITALS — BP 118/75 | HR 102 | Temp 98.7°F | Ht 67.0 in | Wt 188.7 lb

## 2015-09-15 DIAGNOSIS — C50311 Malignant neoplasm of lower-inner quadrant of right female breast: Secondary | ICD-10-CM | POA: Diagnosis not present

## 2015-09-15 HISTORY — DX: Personal history of irradiation: Z92.3

## 2015-09-15 NOTE — Progress Notes (Signed)
Radiation Oncology         [phone] ________________________________  Name: Brandy Douglas MRN: [id number]  Date: 09/15/2015  DOB: 01-26-1971  Follow-Up Visit Note  Outpatient  CC: Brandy Cara, PA-C  Brandy Lose, MD  Diagnosis and Prior Radiotherapy:    ICD-9-CM ICD-10-CM   1. 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  Brandy Douglas presents for follow up of radiation completed 08/08/15 to her Right Breast. She denies pain. She does report "numbness" to the posterior area of her upper Right Arm since surgery.   The skin over her Right Breast is slightly hyperpigmented. She has finished using the Radiaplex cream and is now using Vitamin E cream. She has questions about getting a pedicure after completing chemotherapy.                              ALLERGIES:  has No Known Allergies.  Meds: Current Outpatient Prescriptions  Medication Sig Dispense Refill  . HYDROcodone-acetaminophen (NORCO/VICODIN) 5-325 MG per tablet Take 1-2 tablets by mouth every 6 (six) hours as needed. (Patient not taking: Reported on 07/17/2015) 30 tablet 0  . LORazepam (ATIVAN) 0.5 MG tablet Take 1 tablet (0.5 mg total) by mouth at bedtime. (Patient not taking: Reported on 05/19/2015) 30 tablet 0  . ondansetron (ZOFRAN) 8 MG tablet Take 1 tablet (8 mg total) by mouth 2 (two) times daily as needed. Start on the third day after chemotherapy. (Patient not taking: Reported on 05/19/2015) 30 tablet 1  . prochlorperazine (COMPAZINE) 10 MG tablet Take 1 tablet (10 mg  total) by mouth every 6 (six) hours as needed (Nausea or vomiting). (Patient not taking: Reported on 05/19/2015) 30 tablet 1   No current facility-administered medications for this encounter.    Physical Findings: The patient is in no acute distress. Patient is alert and oriented.  height is 5\' 7"  (1.702 m) and weight is 188 lb 11.2 oz (85.594 kg). Her temperature is 98.7 F (37.1 C). Her blood pressure is 118/75 and her pulse is 102. .     The skin over her right breast and low neck have healed very well with faint residual hyperpigmentation. She has very minimal lymphedema in her right upper extremity.   Lab Findings: Lab Results  Component Value Date   WBC 7.3 06/27/2015   HGB 10.7* 06/27/2015   HCT 32.1* 06/27/2015   MCV 87.9 06/27/2015   PLT 263 06/27/2015    Radiographic Findings: No results found.  Impression/Plan:  She has healed well from radiotherapy. Continue Vit E topicals on breast.  I encouraged her to continue with yearly mammography and followup with medical oncology. I will see her back on an as-needed basis. I have encouraged her to call if she has any issues or concerns in the future. I wished her the very best.  I advised her to let us know if she develops worsening lymphedema to warrant re-referral to physical therapy. I recommended that she proceed with caution when  considering pedicures as many salons have questionable hygiene. Probably a good idea to check with her medical oncologist before she considers pedicures. For now, she plans to just do her own. Her post op numbness in her right upper arm may last for months or more - advised to inquire re: this with her surgeon.  ________________________________   Brandy Gibson, MD   This document serves as a record of services personally performed by Brandy Douglas , MD. It was created on his behalf by Brandy Douglas, a trained medical scribe. The creation of this record is based on the scribe's personal observations  and the provider's statements to them. This document has been checked and approved by the attending provider.

## 2015-09-15 NOTE — Progress Notes (Signed)
Brandy Douglas presents for follow up of radiation completed 08/08/15 to her Right Breast. She denies pain. She does report "numbness" to the posterior area of her upper Right Arm. She has not been wearing her compression sleeve, and wondered if that might be an explanation. The skin over her Right Breast is slightly hyperpigmented. She has finished using the Radiaplex cream and is now using Vitamin E cream. She has questions about getting a pedicure after completing chemotherapy.  BP 118/75 mmHg  Pulse 102  Temp(Src) 98.7 F (37.1 C)  Ht 5\' 7"  (1.702 m)  Wt 188 lb 11.2 oz (85.594 kg)  BMI 29.55 kg/m2   Wt Readings from Last 3 Encounters:  09/15/15 188 lb 11.2 oz (85.594 kg)  08/02/15 187 lb 1.6 oz (84.868 kg)  07/31/15 185 lb 14.4 oz (84.324 kg)

## 2015-09-29 ENCOUNTER — Encounter: Payer: 59 | Admitting: Nurse Practitioner

## 2015-10-06 ENCOUNTER — Telehealth: Payer: Self-pay | Admitting: *Deleted

## 2015-10-06 ENCOUNTER — Encounter: Payer: 59 | Admitting: Adult Health

## 2015-10-06 NOTE — Telephone Encounter (Signed)
Called pt to inquire about appt for today. No answer but left a detailed message for pt to call back to reschedule appt. Message to be fwd to [employer].

## 2015-10-06 NOTE — Telephone Encounter (Signed)
Error

## 2015-10-06 NOTE — Progress Notes (Deleted)
CLINIC:  Survivorship   REASON FOR VISIT:  Routine follow-up post-treatment for a recent history of breast cancer.  BRIEF ONCOLOGIC HISTORY:    Breast cancer of lower-inner quadrant of right female breast (HCC)   08/29/2014 Mammogram    Right breast mass 5 x 4.8 x 4.2 cm, 6 enlarged axillary lymph nodes including nodes in level III location, T2 N3 M0 stage IIIc clinical stage     08/31/2014 Initial Diagnosis    Right breast biopsy 6:00: Invasive ductal carcinoma, right axillary lymph node biopsy high-grade carcinoma ER 0% PR 0% HER-2 negative ratio 1.68, Ki-67 80%, grade 3     09/13/2014 Procedure    Genetic testing: Negative. Genes analyzed: ATM, BRCA1, BRCA2, CDH1, CHEK2, PALB2, PTEN, and TP53     09/13/2014 Echocardiogram    Pre-treatment EF: 45-50% (pt received dexrazoxane with doxorubicin)     09/19/2014 - 02/13/2015 Neo-Adjuvant Chemotherapy    Adriamycin Cytoxan dose dense 4 followed by Abraxane and carboplatin weekly 12     11/09/2014 Echocardiogram    Post-treatment EF: 45-50%     02/20/2015 Breast MRI    Complete radiologic response     05/01/2015 Surgery    Rt Lumpectomy Ezzard Standing): No malignancy, 0/14 LN     06/27/2015 - 08/08/2015 Radiation Therapy    Adjuvant breast radiation Basilio Cairo). Right Breast treated to 50 Gy in 25 fractions;  IM nodes, SCLV and PAB to 45Gy in 25 fractions.  Right Breast boost /10 Gy in 5 fractions       INTERVAL HISTORY:  Brandy Douglas presents to the Survivorship Clinic today for our initial meeting to review her survivorship care plan detailing her treatment course for breast cancer, as well as monitoring long-term side effects of that treatment, education regarding health maintenance, screening, and overall wellness and health promotion.     Overall, Brandy Douglas reports feeling quite well since completing her radiation therapy approximately 2 months ago.  She ***  REVIEW OF SYSTEMS:   GU: Denies vaginal bleeding, discharge, or dryness.    Breast: Denies any new nodularity, masses, tenderness, nipple changes, or nipple discharge.    A 14-point review of systems was completed and was negative, except as noted above.   ONCOLOGY TREATMENT TEAM:  1. Surgeon:  Dr. Ezzard Standing at [facility] 2. Medical Oncologist: Dr. Pamelia Hoit 3. Radiation Oncologist: Dr. Basilio Cairo    PAST MEDICAL/SURGICAL HISTORY:  Past Medical History:  Diagnosis Date  . Anxiety   . Breast cancer (HCC) 2016   R LIQ IDC; triple negative  . Hx of radiation therapy 06/27/15- 08/08/15   Right Breast   Past Surgical History:  Procedure Laterality Date  . BREAST LUMPECTOMY WITH RADIOACTIVE SEED AND AXILLARY LYMPH NODE DISSECTION Right 05/01/2015   Procedure: RIGHT BREAST LUMPECTOMY WITH RADIOACTIVE SEED AND RIGHT AXILLARY LYMPH NODE DISSECTION;  Surgeon: Ovidio Kin, MD;  Location: [facility];  Service: General;  Laterality: Right;  . CHOLECYSTECTOMY    . PORT-A-CATH REMOVAL Left 05/01/2015   Procedure: REMOVAL PORT-A-CATH;  Surgeon: Ovidio Kin, MD;  Location: [facility];  Service: General;  Laterality: Left;  . PORTACATH PLACEMENT Left 09/16/2014   Procedure: INSERTION PORT-A-CATH WITH ULTRA SOUND ;  Surgeon: Ovidio Kin, MD;  Location: [facility];  Service: General;  Laterality: Left;  . TUBAL LIGATION       ALLERGIES:  No Known Allergies   CURRENT MEDICATIONS:  Outpatient Encounter Prescriptions as of 10/06/2015  Medication Sig Note  . HYDROcodone-acetaminophen (  NORCO/VICODIN) 5-325 MG per tablet Take 1-2 tablets by mouth every 6 (six) hours as needed. (Patient not taking: Reported on 07/17/2015)   . LORazepam (ATIVAN) 0.5 MG tablet Take 1 tablet (0.5 mg total) by mouth at bedtime. (Patient not taking: Reported on 05/19/2015) 09/16/2014: Meds to start w/chemo  . ondansetron (ZOFRAN) 8 MG tablet Take 1 tablet (8 mg total) by mouth 2 (two) times daily as needed. Start on the third day after chemotherapy.  (Patient not taking: Reported on 05/19/2015)   . prochlorperazine (COMPAZINE) 10 MG tablet Take 1 tablet (10 mg total) by mouth every 6 (six) hours as needed (Nausea or vomiting). (Patient not taking: Reported on 05/19/2015) 09/16/2014: Meds to start w/ chemo   No facility-administered encounter medications on file as of 10/06/2015.      ONCOLOGIC FAMILY HISTORY:  Family History  Problem Relation Age of Onset  . Breast cancer Paternal Grandmother 42  . Diabetes Father   . Hypertension Father   . Other Sister     has had a hysterectomy due to abnormal bleeding  . Other Sister     has had a hysterectomy due to abnormal cell findings - non-cancerous  . Diabetes Paternal Aunt   . Hypertension Paternal Aunt   . Stroke Paternal Aunt   . Heart attack Paternal Uncle   . Cancer Paternal Uncle     unknown type     GENETIC COUNSELING/TESTING: 09/13/14-Genetic testing: Negative. Genes analyzed: ATM, BRCA1, BRCA2, CDH1, CHEK2, PALB2, PTEN, and TP53   SOCIAL HISTORY:  Brandy Douglas is single and lives in [place], [place].  She has (#) children and they live in (city).  Brandy Douglas is currently retired/disabled/working part-time/full-time as ***.  She denies any current or history of tobacco or illicit drug use.  She drinks alcohol occasionally.    PHYSICAL EXAMINATION:  Vital Signs:  There were no vitals filed for this visit. There were no vitals filed for this visit. General: Well-nourished, well-appearing female in no acute distress.  She is unaccompanied/accompanied in clinic by her ***** today.   HEENT: Head is normocephalic.  Pupils equal and reactive to light and accomodation. Conjunctivae clear without exudate.  Sclerae anicteric. Oral mucosa is pink, moist.  Oropharynx is pink without lesions or erythema.  Lymph: No cervical, supraclavicular, or infraclavicular lymphadenopathy noted on palpation.  Cardiovascular: Regular rate and rhythm.Marland Kitchen Respiratory: Clear to auscultation bilaterally.  Chest expansion symmetric; breathing non-labored.  GI: Abdomen soft and round; non-tender, non-distended. Bowel sounds normoactive. No hepatosplenomegaly.   GU: Deferred.  Neuro: No focal deficits. Steady gait.  Psych: Mood and affect normal and appropriate for situation.  Extremities: No edema. Skin: Warm and dry.  LABORATORY DATA:  None for this visit.  DIAGNOSTIC IMAGING:  None for this visit.      ASSESSMENT AND PLAN:  Brandy Douglas is a pleasant 45 y.o. female with Stage IIIC right breast invasive ductal carcinoma, ER-/PR-/HER2-, diagnosed in 08/2014, treated with neoadjuvant chemo with Adriamycin/Cytoxan x 4 cycles, followed by Carbo/Abraxane x 12 cycles, completing chemo on 02/13/15. She went on to have lumpectomy and adjuvant radiation therapy; completing treatment in 08/08/15.  She presents to the Survivorship Clinic for our initial meeting and routine follow-up post-completion of treatment for breast cancer.    1. Stage IIIC right breast cancer:  Brandy Douglas is continuing to recover from definitive treatment for breast cancer. She will follow-up with her medical oncologist, Dr. Lindi Adie in 10/2015 with history and physical exam per surveillance protocol.  Today, a comprehensive survivorship care plan and treatment summary was reviewed with the patient today detailing her breast cancer diagnosis, treatment course, potential late/long-term effects of treatment, appropriate follow-up care with recommendations for the future, and patient education resources.  A copy of this summary, along with a letter will be sent to the patient's primary care provider via mail/fax/In Basket message after today's visit.    #. Problem(s) at Visit______________  #. Cancer screening:  Due to Brandy Douglas history and her age, she should receive screening for skin cancers, colon cancer, and gynecologic cancers.  The information and recommendations are listed on the patient's comprehensive care plan/treatment summary  and were reviewed in detail with the patient.    #. Health maintenance and wellness promotion: Brandy Douglas was encouraged to consume 5-7 servings of fruits and vegetables per day. We reviewed the "Nutrition Rainbow" handout, as well as the handout about "Nutrition for Breast Cancer Survivors."  She was also encouraged to engage in moderate to vigorous exercise for 30 minutes per day most days of the week. We discussed the [employer] fitness program, which is designed for cancer survivors to help them become more physically fit after cancer treatments.  She was instructed to limit her alcohol consumption and continue to abstain from tobacco use.   #. Support services/counseling: It is not uncommon for this period of the patient's cancer care trajectory to be one of many emotions and stressors.  We discussed an opportunity for her to participate in the next session of [facility] ("Finding Your New Normal") support group series designed for patients after they have completed treatment.   Ms. Douglas was encouraged to take advantage of our many other support services programs, support groups, and/or counseling in coping with her new life as a cancer survivor after completing anti-cancer treatment.  She was offered support today through active listening and expressive supportive counseling.  She was given information regarding our available services and encouraged to contact me with any questions or for help enrolling in any of our support group/programs.    Dispo:   -Return to cancer center to see Dr. Lindi Adie in 10/2015 -She is welcome to return back to the Survivorship Clinic at any time; no additional follow-up needed at this time.  -Consider referral back to survivorship as a long-term survivor for continued surveillance  A total of (#) minutes of face-to-face time was spent with this patient with greater than 50% of that time in counseling and care-coordination.   Mike Craze, NP Survivorship  Program [facility] [phone]   Note: PRIMARY CARE PROVIDER Elisabeth Cara, [place] [phone]

## 2015-10-10 ENCOUNTER — Encounter: Payer: Self-pay | Admitting: Hematology and Oncology

## 2015-10-10 ENCOUNTER — Ambulatory Visit (HOSPITAL_BASED_OUTPATIENT_CLINIC_OR_DEPARTMENT_OTHER): Payer: 59 | Admitting: Hematology and Oncology

## 2015-10-10 ENCOUNTER — Telehealth: Payer: Self-pay | Admitting: Hematology and Oncology

## 2015-10-10 DIAGNOSIS — Z171 Estrogen receptor negative status [ER-]: Secondary | ICD-10-CM | POA: Diagnosis not present

## 2015-10-10 DIAGNOSIS — C50311 Malignant neoplasm of lower-inner quadrant of right female breast: Secondary | ICD-10-CM

## 2015-10-10 DIAGNOSIS — C773 Secondary and unspecified malignant neoplasm of axilla and upper limb lymph nodes: Secondary | ICD-10-CM

## 2015-10-10 NOTE — Assessment & Plan Note (Signed)
Right breast mass 5 x 4.8 x 4.2 cm, 6 enlarged axillary lymph nodes including nodes in level III location, T2 N3 M0 stage IIIc clinical stage Right breast biopsy 6:00: Invasive ductal carcinoma, right axillary lymph node biopsy high-grade carcinoma ER 0% PR 0% HER-2 negative ratio 1.68, Ki-67 80%, grade 3 Neo-adjuvant chemotherapy with dose dense Adriamycin and Cytoxan 4 followed by weekly Abraxane and carboplatin 12 (started 09/19/2014 and completed 02/13/2015) (carboplatin discontinued for thrombocytopenia from cycle 9) Breast MRI 02/20/2015: Complete radiologic response Rt Lumpectomy: 05/01/15: Complete Path response 0/14 LN Adjuvant radiation: 06/27/2015 to 08/08/2015  -------------------------------------------------------------------------------------------------------------------------- Breast Cancer Surveillance: 1. Breast exam 10/10/2015: No evidence of recurrence 2. Mammogram: Will need to be scheduled.  RTC in 6 months for surveillance

## 2015-10-10 NOTE — Progress Notes (Signed)
Patient Care Team: Elisabeth Cara, PA-C as PCP - General (Family Medicine) [name], MD as Consulting Physician (General Surgery) Nicholas Lose, MD as Consulting Physician (Hematology and Oncology) Eppie Gibson, MD as Attending Physician (Radiation Oncology) Rockwell Germany, RN as Registered Nurse Mauro Kaufmann, RN as Registered Nurse  DIAGNOSIS: Breast cancer of lower-inner quadrant of right female breast [facility])   Staging form: Breast, AJCC 7th Edition   - Clinical stage from 09/07/2014: Stage IIIC (T2, N3, M0) - Unsigned         Staging comments: Staged at breast conference on 6.29.16   - Pathologic stage from 05/01/2015: yT0, N0, cM0 - Signed by Holley Bouche, NP on 10/02/2015  SUMMARY OF ONCOLOGIC HISTORY:   Breast cancer of lower-inner quadrant of right female breast ([place])   08/29/2014 Mammogram    Right breast mass 5 x 4.8 x 4.2 cm, 6 enlarged axillary lymph nodes including nodes in level III location, T2 N3 M0 stage IIIc clinical stage     08/31/2014 Initial Diagnosis    Right breast biopsy 6:00: Invasive ductal carcinoma, right axillary lymph node biopsy high-grade carcinoma ER 0% PR 0% HER-2 negative ratio 1.68, Ki-67 80%, grade 3     09/13/2014 Procedure    Genetic testing: Negative. Genes analyzed: ATM, BRCA1, BRCA2, CDH1, CHEK2, PALB2, PTEN, and TP53     09/13/2014 Echocardiogram    Pre-treatment EF: 45-50% (pt received dexrazoxane with doxorubicin)     09/19/2014 - 02/13/2015 Neo-Adjuvant Chemotherapy    Adriamycin Cytoxan dose dense 4 followed by Abraxane and carboplatin weekly 12     11/09/2014 Echocardiogram    Post-treatment EF: 45-50%     02/20/2015 Breast MRI    Complete radiologic response     05/01/2015 Surgery    Rt Lumpectomy Lucia Gaskins): No malignancy, 0/14 LN     06/27/2015 - 08/08/2015 Radiation Therapy    Adjuvant breast radiation Isidore Moos). Right Breast treated to 50 Gy in 25 fractions;  IM nodes, SCLV and PAB to 45Gy in 25 fractions.  Right  Breast boost /10 Gy in 5 fractions       CHIEF COMPLIANT: Follow-up of right breast cancer  INTERVAL HISTORY: Brandy Douglas is a 45 year old with above-mentioned history right breast cancer treated with neoadjuvant chemotherapy followed by lumpectomy and radiation. She had a complete pathologic response to neoadjuvant treatment. She reports overall feeling well aerated she is concerned that the hair is not coming as quickly. She is also concerned with occasional sharp jabs of pain in the breasts or axilla. Denies any lumps or nodules.  REVIEW OF SYSTEMS:   Constitutional: Denies fevers, chills or abnormal weight loss Eyes: Denies blurriness of vision Ears, nose, mouth, throat, and face: Denies mucositis or sore throat Respiratory: Denies cough, dyspnea or wheezes Cardiovascular: Denies palpitation, chest discomfort Gastrointestinal:  Denies nausea, heartburn or change in bowel habits Skin: Denies abnormal skin rashes Lymphatics: Denies new lymphadenopathy or easy bruising Neurological:Denies numbness, tingling or new weaknesses Behavioral/Psych: Mood is stable, no new changes  Extremities: No lower extremity edema Breast:  denies any pain or lumps or nodules in either breasts All other systems were reviewed with the patient and are negative.  I have reviewed the past medical history, past surgical history, social history and family history with the patient and they are unchanged from previous note.  ALLERGIES:  has No Known Allergies.  MEDICATIONS:  Current Outpatient Prescriptions  Medication Sig Dispense Refill  . HYDROcodone-acetaminophen (NORCO/VICODIN) 5-325 MG per tablet  Take 1-2 tablets by mouth every 6 (six) hours as needed. (Patient not taking: Reported on 07/17/2015) 30 tablet 0  . LORazepam (ATIVAN) 0.5 MG tablet Take 1 tablet (0.5 mg total) by mouth at bedtime. (Patient not taking: Reported on 05/19/2015) 30 tablet 0  . ondansetron (ZOFRAN) 8 MG tablet Take 1 tablet (8 mg  total) by mouth 2 (two) times daily as needed. Start on the third day after chemotherapy. (Patient not taking: Reported on 05/19/2015) 30 tablet 1  . prochlorperazine (COMPAZINE) 10 MG tablet Take 1 tablet (10 mg total) by mouth every 6 (six) hours as needed (Nausea or vomiting). (Patient not taking: Reported on 05/19/2015) 30 tablet 1   No current facility-administered medications for this visit.     PHYSICAL EXAMINATION: ECOG PERFORMANCE STATUS: 1 - Symptomatic but completely ambulatory  Vitals:   10/10/15 1403  BP: 112/82  Pulse: 98  Resp: 18  Temp: 98 F (36.7 C)   Filed Weights   10/10/15 1403  Weight: 187 lb 4.8 oz (85 kg)    GENERAL:alert, no distress and comfortable SKIN: skin color, texture, turgor are normal, no rashes or significant lesions EYES: normal, Conjunctiva are pink and non-injected, sclera clear OROPHARYNX:no exudate, no erythema and lips, buccal mucosa, and tongue normal  NECK: supple, thyroid normal size, non-tender, without nodularity LYMPH:  no palpable lymphadenopathy in the cervical, axillary or inguinal LUNGS: clear to auscultation and percussion with normal breathing effort HEART: regular rate & rhythm and no murmurs and no lower extremity edema ABDOMEN:abdomen soft, non-tender and normal bowel sounds MUSCULOSKELETAL:no cyanosis of digits and no clubbing  NEURO: alert & oriented x 3 with fluent speech, no focal motor/sensory deficits EXTREMITIES: No lower extremity edema BREAST: No palpable masses or nodules in either right or left breasts. Scars from prior surgery are noted. No palpable axillary supraclavicular or infraclavicular adenopathy no breast tenderness or nipple discharge. (exam performed in the presence of a chaperone)  LABORATORY DATA:  I have reviewed the data as listed   Chemistry      Component Value Date/Time   NA 135 06/27/2015 2308   NA 139 02/13/2015 0847   K 3.6 06/27/2015 2308   K 4.2 02/13/2015 0847   CL 103 06/27/2015 2308    CO2 25 06/27/2015 2308   CO2 24 02/13/2015 0847   BUN 7 06/27/2015 2308   BUN 7.8 02/13/2015 0847   CREATININE 0.59 06/27/2015 2308   CREATININE 0.7 02/13/2015 0847      Component Value Date/Time   CALCIUM 9.0 06/27/2015 2308   CALCIUM 9.4 02/13/2015 0847   ALKPHOS 69 04/25/2015 1315   ALKPHOS 71 02/13/2015 0847   AST 19 04/25/2015 1315   AST 16 02/13/2015 0847   ALT 16 04/25/2015 1315   ALT 9 02/13/2015 0847   BILITOT 0.6 04/25/2015 1315   BILITOT <0.30 02/13/2015 0847      Lab Results  Component Value Date   WBC 7.3 06/27/2015   HGB 10.7 (L) 06/27/2015   HCT 32.1 (L) 06/27/2015   MCV 87.9 06/27/2015   PLT 263 06/27/2015   NEUTROABS 4.7 06/27/2015   ASSESSMENT & PLAN:  Breast cancer of lower-inner quadrant of right female breast Right breast mass 5 x 4.8 x 4.2 cm, 6 enlarged axillary lymph nodes including nodes in level III location, T2 N3 M0 stage IIIc clinical stage Right breast biopsy 6:00: Invasive ductal carcinoma, right axillary lymph node biopsy high-grade carcinoma ER 0% PR 0% HER-2 negative ratio 1.68, Ki-67 80%, grade  3 Neo-adjuvant chemotherapy with dose dense Adriamycin and Cytoxan 4 followed by weekly Abraxane and carboplatin 12 (started 09/19/2014 and completed 02/13/2015) (carboplatin discontinued for thrombocytopenia from cycle 9) Breast MRI 02/20/2015: Complete radiologic response Rt Lumpectomy: 05/01/15: Complete Path response 0/14 LN Adjuvant radiation: 06/27/2015 to 08/08/2015  -------------------------------------------------------------------------------------------------------------------------- Breast Cancer Surveillance: 1. Breast exam 10/10/2015: No evidence of recurrence 2. Mammogram: Will need to be scheduled. I sent an order to the breast center  I encouraged the patient to stay active and exercises daily. I gave her information regarding the [employer] live [employer] program.  RTC in 6 months for surveillance  No orders of the defined types  were placed in this encounter.  The patient has a good understanding of the overall plan. she agrees with it. she will call with any problems that may develop before the next visit here.   Rulon Eisenmenger, MD 10/10/15

## 2015-10-10 NOTE — Telephone Encounter (Signed)
per pof to sch pt appt-gave pt copy of avs °

## 2015-10-16 ENCOUNTER — Ambulatory Visit
Admission: RE | Admit: 2015-10-16 | Discharge: 2015-10-16 | Disposition: A | Discharge status: Home / Self Care | Payer: 59 | Source: Ambulatory Visit | Attending: Hematology and Oncology | Admitting: Hematology and Oncology

## 2015-10-16 DIAGNOSIS — C50311 Malignant neoplasm of lower-inner quadrant of right female breast: Secondary | ICD-10-CM

## 2015-11-24 NOTE — Progress Notes (Signed)
[employer] representative Note  Diagnosis: Breast Cancer Breast cancer of lower-inner quadrant of right female breast 174.3 C50.311    The patient's CT images from her initial simulation were reviewed to plan her boost treatment to her right breast  lumpectomy cavity.  Measurements were made regarding the size and depth of the surgical bed. The boost to the lumpectomy cavity will be delivered with 18 MeV electrons; 10 Gy in 5 fractions has been prescribed to the 100% isodose line.   A special port plan was reviewed and approved.  A custom electron cut-out will be used for her boost field.    -----------------------------------  Eppie Gibson, MD

## 2016-03-29 ENCOUNTER — Other Ambulatory Visit: Payer: Self-pay | Admitting: Nurse Practitioner

## 2016-04-07 ENCOUNTER — Telehealth: Payer: Self-pay

## 2016-04-07 NOTE — Telephone Encounter (Signed)
Called patient on her number and it is a non working number,i called and left a message on her sisters number with a new appt due to call day,she is also active on [employer]

## 2016-04-11 ENCOUNTER — Ambulatory Visit: Payer: 59 | Admitting: Hematology and Oncology

## 2016-04-13 IMAGING — MR MR BREAST BILATERAL W WO CONTRAST
6 of 13 series · 23 of 48 positions shown · IV contrast (15cc multihance)
Comparison: Previous exam(s).

CLINICAL DATA: Right breast cancer with right axillary nodal
metastasis diagnosed in August 2014, following ultrasound-guided
biopsy of a highly suspicious mass 5 to 6 o'clock position right
breast and biopsy of one of multiple abnormal right axillary lymph
nodes. The patient had suspicious right subpectoral lymph nodes as
well. The patient has completed neoadjuvant chemotherapy. Evaluate
for response. No new areas of concern.

LABS:  None performed
EXAM:
BILATERAL BREAST MRI WITH AND WITHOUT CONTRAST
TECHNIQUE: Multiplanar, multisequence MR images of both breasts were obtained
prior to and following the intravenous administration of 15 ml of
breast MRI 09/09/2014. Bilateral mammogram and right breast
ultrasound 08/29/2014. CT chest abdomen and pelvis 09/15/2014

[Series 2: T2 · axial · 3.0mm · 0.47mm/px · z∈[-82,+80]mm · 3 of 55 slices shown]
[im 1/55]
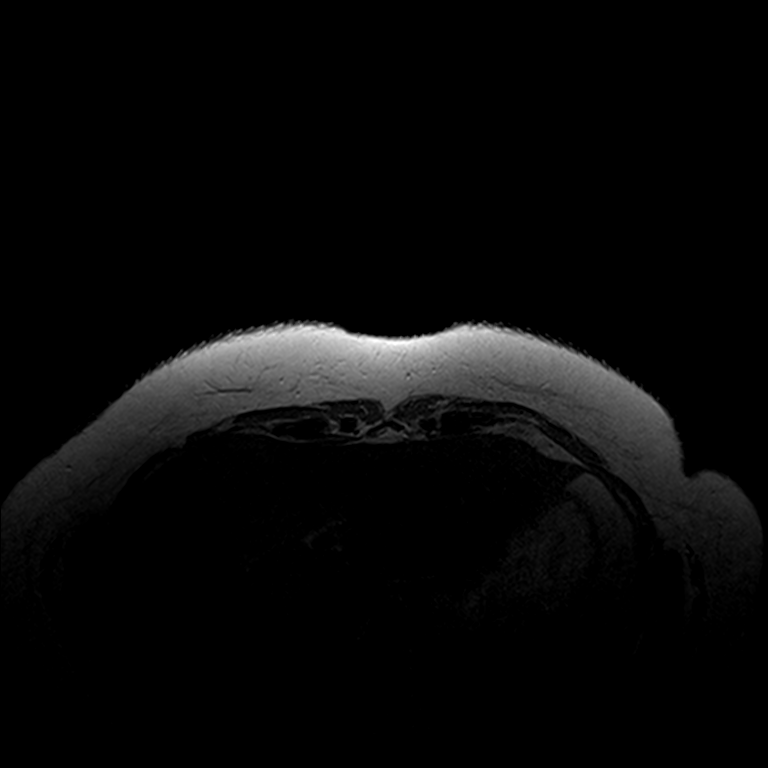
[im 28/55]
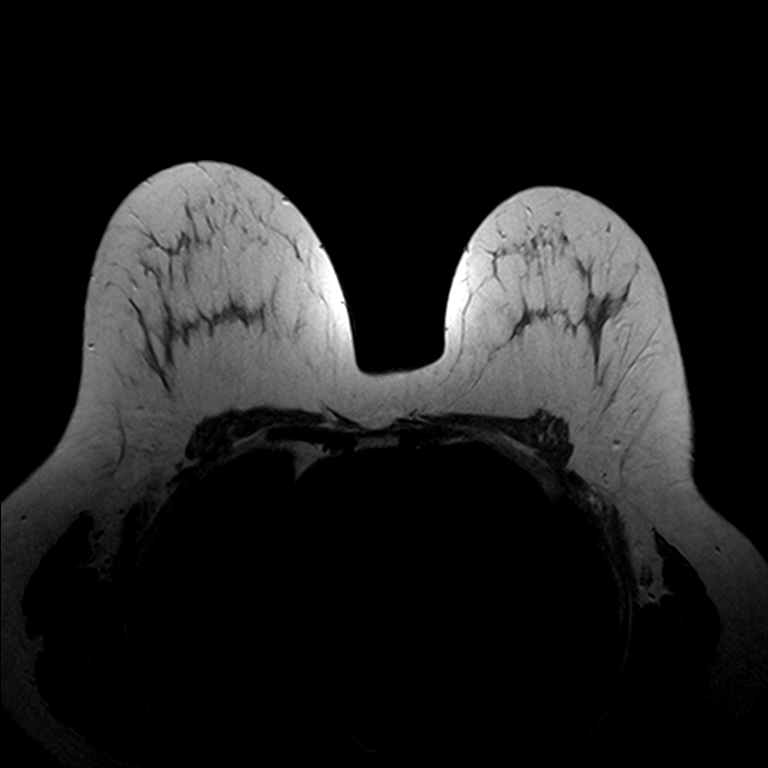
[im 55/55]
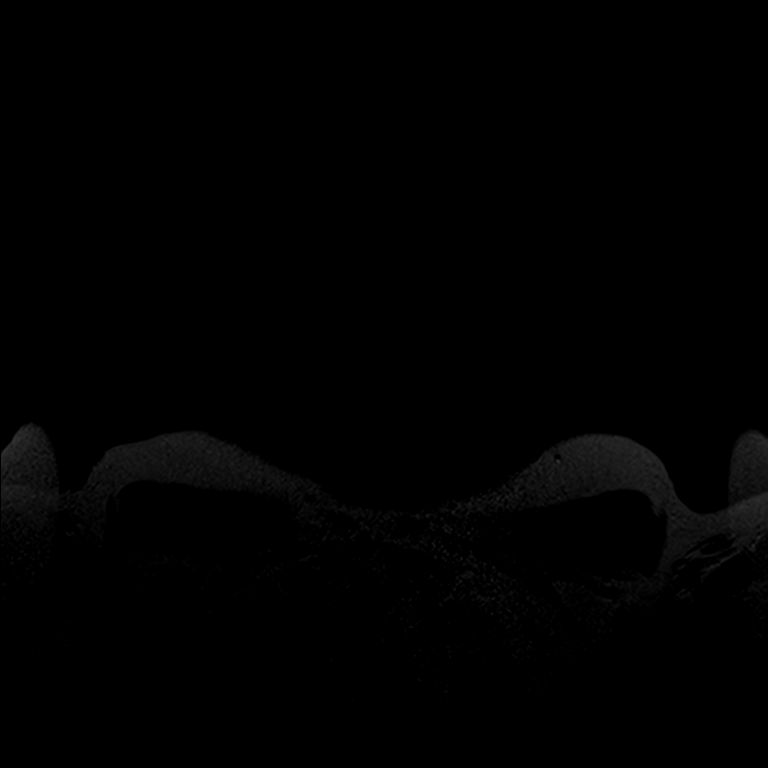

[Series 3: t2_tirm_tra ipat (a-p) · axial · 3.0mm · 0.70mm/px · z∈[-82,+80]mm · 2 of 55 slices shown]
[im 1/55]
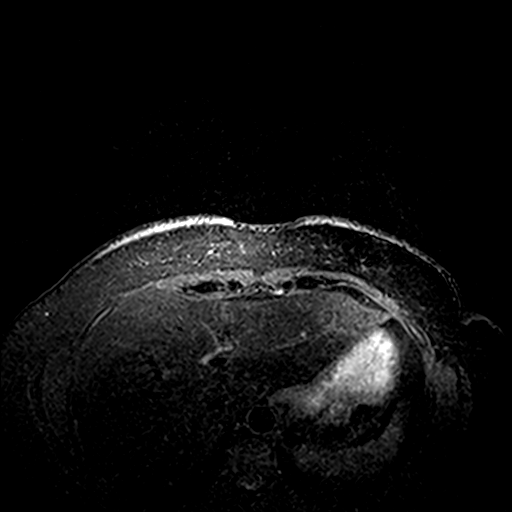
[im 55/55]
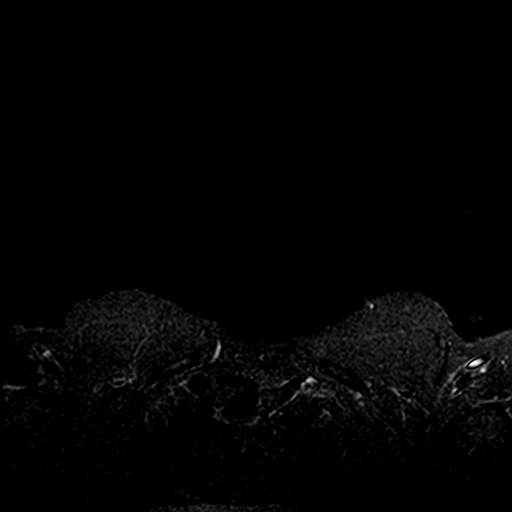

[Series 4: fl3d pre-cm no · axial · non-contrast · 1.2mm · 0.94mm/px · z∈[-87,+85]mm · 5 of 144 slices shown]
[im 1/144]
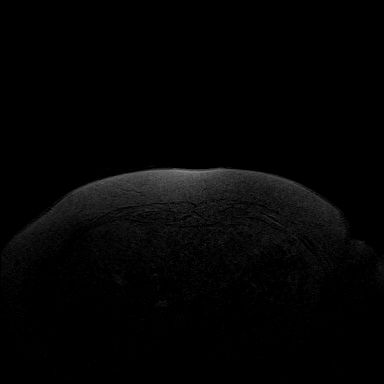
[im 36/144]
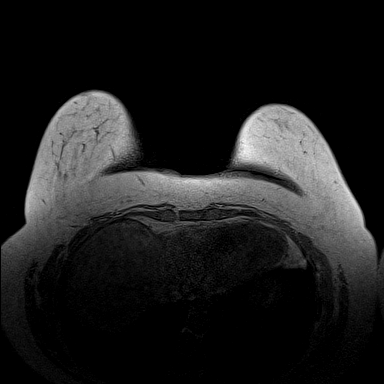
[im 72/144]
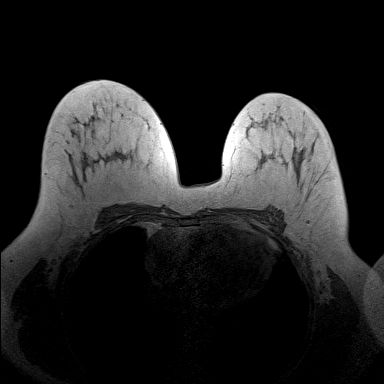
[im 108/144]
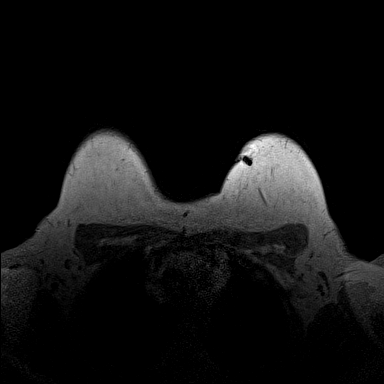
[im 144/144]
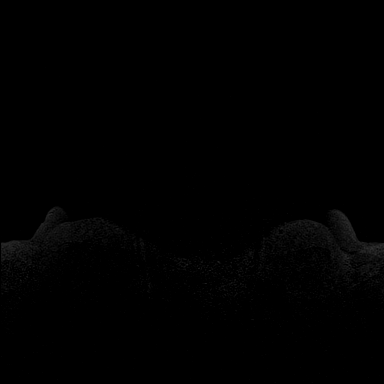

[Series 5: fl3d pre-cm · axial · non-contrast · 1.2mm · 0.94mm/px · z∈[-87,+85]mm · 5 of 144 slices shown]
[im 1/144]
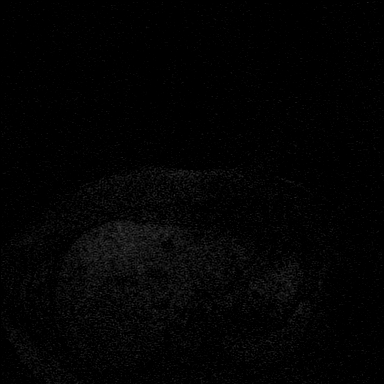
[im 36/144]
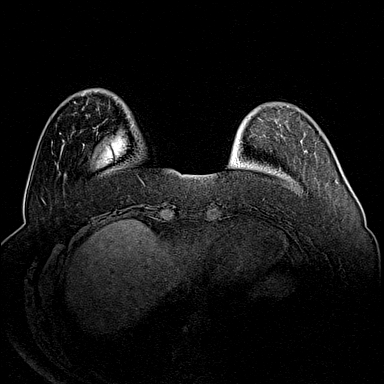
[im 72/144]
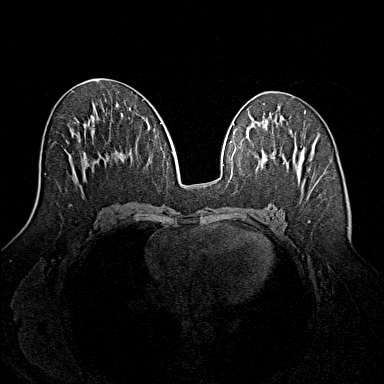
[im 108/144]
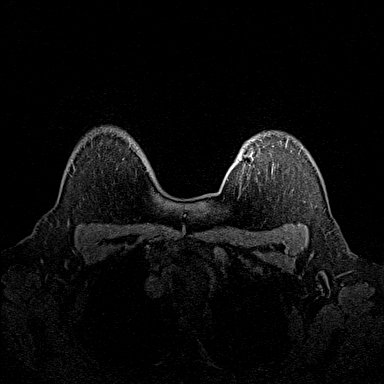
[im 144/144]
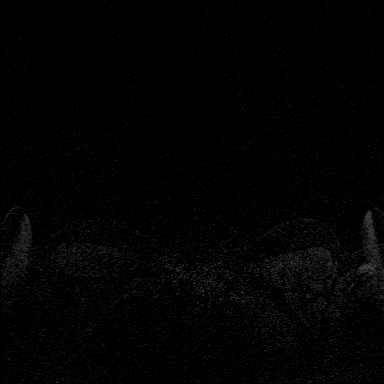

[Series 6: fl3d post immediate · axial · 1.2mm · 0.94mm/px · z∈[-87,+85]mm · 5 of 144 slices shown (1 of 2)]
[im 1/144]
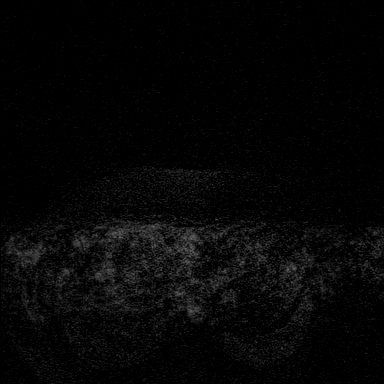
[im 36/144]
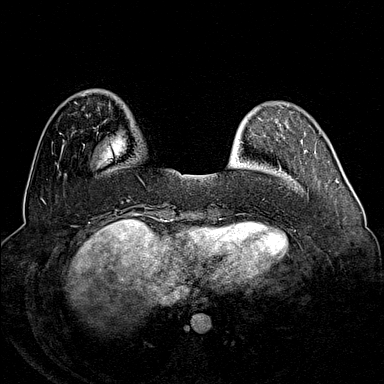
[im 72/144]
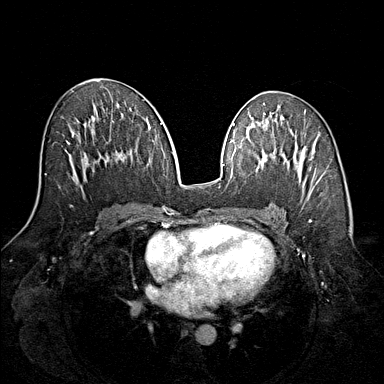
[im 108/144]
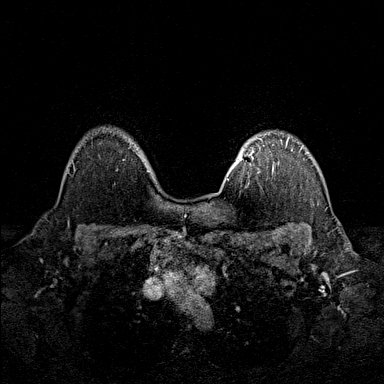
[im 144/144]
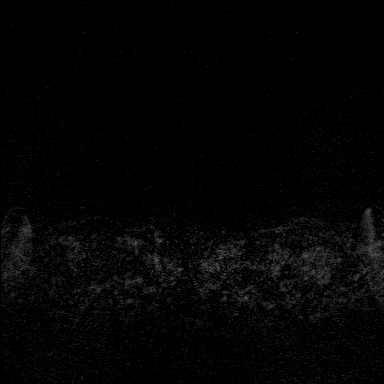

[Series 7: fl3d post immediate · axial · 1.2mm · 0.94mm/px · z∈[-87,-2]mm · 3 of 144 slices shown (2 of 2)]
[im 1/144]
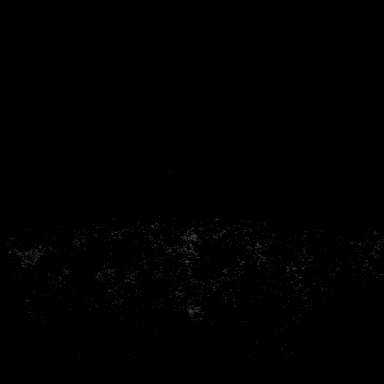
[im 36/144]
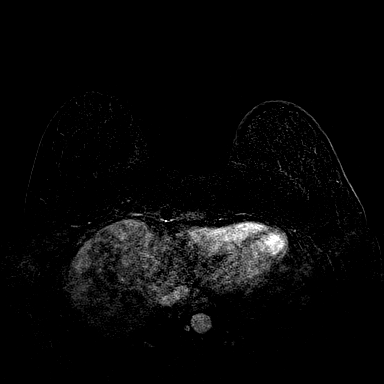
[im 72/144]
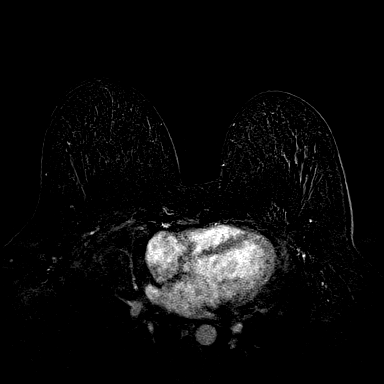

[23 of 48 positions shown; findings below may reference images not displayed]

THREE-DIMENSIONAL MR IMAGE RENDERING ON INDEPENDENT WORKSTATION:

Three-dimensional MR images were rendered by post-processing of the
original MR data on an independent workstation. The
three-dimensional MR images were interpreted, and findings are
reported in the following complete MRI report for this study. Three
dimensional images were evaluated at the independent [employer]
workstation
FINDINGS: Breast composition: b. Scattered fibroglandular tissue.

Background parenchymal enhancement: Mild

Right breast: No mass or abnormal enhancement. Biopsy clip artifact
is seen in the anterior half of the lower inner quadrant of the
right breast at the site of the previously biopsied mass. There is
no residual mass or abnormal enhancement in this region of the right
breast.

Left breast: No mass or abnormal enhancement. Port-A-Cath noted in
the upper inner quadrant of the left breast.

Lymph nodes: No abnormal appearing lymph nodes. Specifically, the
previously enlarged right axillary lymph nodes are now within normal
limits for size and symmetric compared to the left axilla. The
previously enlarged right subpectoral lymph nodes are no longer
visible. Negative for internal mammary chain lymphadenopathy.

Ancillary findings: None. Visualized portion the liver is
unremarkable.
IMPRESSION: Complete imaging response to neoadjuvant chemotherapy in this
patient with biopsy-proven invasive ductal carcinoma of the lower
inner quadrant of the right breast and biopsy proven right axillary
metastasis. No new areas of concern.

RECOMMENDATION:
Continue treatment planning.

BI-RADS CATEGORY  6: Known biopsy-proven malignancy.

## 2016-04-15 ENCOUNTER — Ambulatory Visit (HOSPITAL_BASED_OUTPATIENT_CLINIC_OR_DEPARTMENT_OTHER): Payer: 59 | Admitting: Hematology and Oncology

## 2016-04-15 ENCOUNTER — Encounter: Payer: Self-pay | Admitting: Hematology and Oncology

## 2016-04-15 DIAGNOSIS — Z923 Personal history of irradiation: Secondary | ICD-10-CM

## 2016-04-15 DIAGNOSIS — Z853 Personal history of malignant neoplasm of breast: Secondary | ICD-10-CM | POA: Diagnosis not present

## 2016-04-15 DIAGNOSIS — C50311 Malignant neoplasm of lower-inner quadrant of right female breast: Secondary | ICD-10-CM

## 2016-04-15 DIAGNOSIS — Z9221 Personal history of antineoplastic chemotherapy: Secondary | ICD-10-CM | POA: Diagnosis not present

## 2016-04-15 DIAGNOSIS — Z171 Estrogen receptor negative status [ER-]: Secondary | ICD-10-CM

## 2016-04-15 NOTE — Progress Notes (Signed)
Patient Care Team: Elisabeth Cara, PA-C as PCP - General (Family Medicine) [name], MD as Consulting Physician (General Surgery) Nicholas Lose, MD as Consulting Physician (Hematology and Oncology) Eppie Gibson, MD as Attending Physician (Radiation Oncology) Rockwell Germany, RN as Registered Nurse Mauro Kaufmann, RN as Registered Nurse  DIAGNOSIS:  Encounter Diagnosis  Name Primary?  . Malignant neoplasm of lower-inner quadrant of right breast of female, estrogen receptor negative ([place])     SUMMARY OF ONCOLOGIC HISTORY:   Breast cancer of lower-inner quadrant of right female breast ([place])   08/29/2014 Mammogram    Right breast mass 5 x 4.8 x 4.2 cm, 6 enlarged axillary lymph nodes including nodes in level III location, T2 N3 M0 stage IIIc clinical stage      08/31/2014 Initial Diagnosis    Right breast biopsy 6:00: Invasive ductal carcinoma, right axillary lymph node biopsy high-grade carcinoma ER 0% PR 0% HER-2 negative ratio 1.68, Ki-67 80%, grade 3      09/13/2014 Procedure    Genetic testing: Negative. Genes analyzed: ATM, BRCA1, BRCA2, CDH1, CHEK2, PALB2, PTEN, and TP53      09/13/2014 Echocardiogram    Pre-treatment EF: 45-50% (pt received dexrazoxane with doxorubicin)      09/19/2014 - 02/13/2015 Neo-Adjuvant Chemotherapy    Adriamycin Cytoxan dose dense 4 followed by Abraxane and carboplatin weekly 12      11/09/2014 Echocardiogram    Post-treatment EF: 45-50%      02/20/2015 Breast MRI    Complete radiologic response      05/01/2015 Surgery    Rt Lumpectomy Lucia Gaskins): No malignancy, 0/14 LN      06/27/2015 - 08/08/2015 Radiation Therapy    Adjuvant breast radiation Isidore Moos). Right Breast treated to 50 Gy in 25 fractions;  IM nodes, SCLV and PAB to 45Gy in 25 fractions.  Right Breast boost /10 Gy in 5 fractions        CHIEF COMPLIANT: Surveillance of breast cancer  INTERVAL HISTORY: Brandy Douglas is a 46 year old with above-mentioned history of right  breast cancer who is here for surveillance exam. She reports no new complaints concerns. 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 She is doing much better. She is getting physical therapy. Denies any lumps or nodules in the breasts.  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  I have reviewed the past medical history, past surgical history, social history and family history with the patient and they are unchanged from previous note.  ALLERGIES:  has No Known Allergies.  MEDICATIONS:  Current Outpatient Prescriptions  Medication Sig Dispense Refill  . HYDROcodone-acetaminophen (NORCO/VICODIN) 5-325 MG per tablet Take 1-2 tablets by mouth every 6 (six) hours as needed. (Patient not taking: Reported on 07/17/2015) 30 tablet 0  . LORazepam (ATIVAN) 0.5 MG tablet Take 1 tablet (0.5 mg total) by mouth at bedtime. (Patient not taking: Reported on 05/19/2015) 30 tablet 0  .  ondansetron (ZOFRAN) 8 MG tablet Take 1 tablet (8 mg total) by mouth 2 (two) times daily as needed. Start on the third day after chemotherapy. (Patient not taking: Reported on 05/19/2015) 30 tablet 1  . prochlorperazine (COMPAZINE) 10 MG tablet Take 1 tablet (10 mg total) by mouth every 6 (six) hours as needed (Nausea or vomiting). (Patient not  taking: Reported on 05/19/2015) 30 tablet 1   No current facility-administered medications for this visit.     PHYSICAL EXAMINATION: ECOG PERFORMANCE STATUS: 1 - Symptomatic but completely ambulatory  Vitals:   04/15/16 1529  BP: 121/84  Pulse: 96  Resp: 18  Temp: 98.2 F (36.8 C)   Filed Weights   04/15/16 1529  Weight: 194 lb 9.6 oz (88.3 kg)    GENERAL:alert, no distress and comfortable SKIN: skin color, texture, turgor are normal, no rashes or significant lesions EYES: normal, Conjunctiva are pink and non-injected, sclera clear OROPHARYNX:no exudate, no erythema and lips, buccal mucosa, and tongue normal  NECK: supple, thyroid normal size, non-tender, without nodularity LYMPH:  no palpable lymphadenopathy in the cervical, axillary or inguinal LUNGS: clear to auscultation and percussion with normal breathing effort HEART: regular rate & rhythm and no murmurs and no lower extremity edema ABDOMEN:abdomen soft, non-tender and normal bowel sounds MUSCULOSKELETAL:no cyanosis of digits and no clubbing  NEURO: alert & oriented x 3 with fluent speech, no focal motor/sensory deficits EXTREMITIES: No lower extremity edema BREAST: No palpable masses or nodules in either right or left breasts. No palpable axillary supraclavicular or infraclavicular adenopathy no breast tenderness or nipple discharge. (exam performed in the presence of a chaperone)  LABORATORY DATA:  I have reviewed the data as listed   Chemistry      Component Value Date/Time   NA 135 06/27/2015 2308   NA 139 02/13/2015 0847   K 3.6 06/27/2015 2308   K 4.2 02/13/2015 0847   CL 103 06/27/2015 2308   CO2 25 06/27/2015 2308   CO2 24 02/13/2015 0847   BUN 7 06/27/2015 2308   BUN 7.8 02/13/2015 0847   CREATININE 0.59 06/27/2015 2308   CREATININE 0.7 02/13/2015 0847      Component Value Date/Time   CALCIUM 9.0 06/27/2015 2308   CALCIUM 9.4 02/13/2015 0847   ALKPHOS 69 04/25/2015 1315   ALKPHOS 71 02/13/2015  0847   AST 19 04/25/2015 1315   AST 16 02/13/2015 0847   ALT 16 04/25/2015 1315   ALT 9 02/13/2015 0847   BILITOT 0.6 04/25/2015 1315   BILITOT <0.30 02/13/2015 0847       Lab Results  Component Value Date   WBC 7.3 06/27/2015   HGB 10.7 (L) 06/27/2015   HCT 32.1 (L) 06/27/2015   MCV 87.9 06/27/2015   PLT 263 06/27/2015   NEUTROABS 4.7 06/27/2015    ASSESSMENT & PLAN:  Breast cancer of lower-inner quadrant of right female breast Right breast mass 5 x 4.8 x 4.2 cm, 6 enlarged axillary lymph nodes including nodes in level III location, T2 N3 M0 stage IIIc clinical stage Right breast biopsy 6:00: Invasive ductal carcinoma, right axillary lymph node biopsy high-grade carcinoma ER 0% PR 0% HER-2 negative ratio 1.68, Ki-67 80%, grade 3 Neo-adjuvant chemotherapy with dose dense Adriamycin and Cytoxan 4 followed by weekly Abraxane and carboplatin 12 (started 09/19/2014 and completed 02/13/2015) (carboplatin discontinued for thrombocytopenia from cycle 9) Breast MRI 02/20/2015: Complete radiologic response Rt Lumpectomy: 05/01/15: Complete Path response 0/14 LN Adjuvant radiation: 06/27/2015 to 08/08/2015  -------------------------------------------------------------------------------------------------------------------------- Breast Cancer  Surveillance: 1. Breast exam 04/15/2016: No evidence of recurrence 2. Mammogram: 10/16/2015: No evidence of malignancy in either breast.  I encouraged the patient to stay active and exercises daily.   RTC in one year for surveillance   I spent 25 minutes talking to the patient of which more than half was spent in counseling and coordination of care.  No orders of the defined types were placed in this encounter.  The patient has a good understanding of the overall plan. she agrees with it. she will call with any problems that may develop before the next visit here.   Rulon Eisenmenger, MD 04/15/16

## 2016-04-15 NOTE — Assessment & Plan Note (Signed)
Right breast mass 5 x 4.8 x 4.2 cm, 6 enlarged axillary lymph nodes including nodes in level III location, T2 N3 M0 stage IIIc clinical stage Right breast biopsy 6:00: Invasive ductal carcinoma, right axillary lymph node biopsy high-grade carcinoma ER 0% PR 0% HER-2 negative ratio 1.68, Ki-67 80%, grade 3 Neo-adjuvant chemotherapy with dose dense Adriamycin and Cytoxan 4 followed by weekly Abraxane and carboplatin 12 (started 09/19/2014 and completed 02/13/2015) (carboplatin discontinued for thrombocytopenia from cycle 9) Breast MRI 02/20/2015: Complete radiologic response Rt Lumpectomy: 05/01/15: Complete Path response 0/14 LN Adjuvant radiation: 06/27/2015 to 08/08/2015  -------------------------------------------------------------------------------------------------------------------------- Breast Cancer Surveillance: 1. Breast exam 04/15/2016: No evidence of recurrence 2. Mammogram: 10/16/2015: No evidence of malignancy in either breast.  I encouraged the patient to stay active and exercises daily.   RTC in one year for surveillance

## 2016-09-13 ENCOUNTER — Other Ambulatory Visit: Payer: Self-pay | Admitting: Hematology and Oncology

## 2016-09-13 DIAGNOSIS — Z9889 Other specified postprocedural states: Secondary | ICD-10-CM

## 2016-10-16 ENCOUNTER — Ambulatory Visit
Admission: RE | Admit: 2016-10-16 | Discharge: 2016-10-16 | Disposition: A | Discharge status: Home / Self Care | Payer: 59 | Source: Ambulatory Visit | Attending: Hematology and Oncology | Admitting: Hematology and Oncology

## 2016-10-16 DIAGNOSIS — Z9889 Other specified postprocedural states: Secondary | ICD-10-CM

## 2017-03-10 ENCOUNTER — Emergency Department (HOSPITAL_BASED_OUTPATIENT_CLINIC_OR_DEPARTMENT_OTHER): Payer: 59

## 2017-03-10 ENCOUNTER — Emergency Department (HOSPITAL_BASED_OUTPATIENT_CLINIC_OR_DEPARTMENT_OTHER)
Admission: EM | Admit: 2017-03-10 | Discharge: 2017-03-10 | Disposition: A | Discharge status: Home / Self Care | Payer: 59 | Attending: Physician Assistant | Admitting: Physician Assistant

## 2017-03-10 ENCOUNTER — Encounter (HOSPITAL_BASED_OUTPATIENT_CLINIC_OR_DEPARTMENT_OTHER): Payer: Self-pay | Admitting: *Deleted

## 2017-03-10 ENCOUNTER — Other Ambulatory Visit: Payer: Self-pay

## 2017-03-10 DIAGNOSIS — R05 Cough: Secondary | ICD-10-CM | POA: Diagnosis present

## 2017-03-10 DIAGNOSIS — B9789 Other viral agents as the cause of diseases classified elsewhere: Secondary | ICD-10-CM | POA: Diagnosis not present

## 2017-03-10 DIAGNOSIS — J069 Acute upper respiratory infection, unspecified: Secondary | ICD-10-CM | POA: Insufficient documentation

## 2017-03-10 DIAGNOSIS — Z853 Personal history of malignant neoplasm of breast: Secondary | ICD-10-CM | POA: Diagnosis not present

## 2017-03-10 NOTE — ED Provider Notes (Signed)
[facility] EMERGENCY DEPARTMENT Provider Note   CSN: [id number] Arrival date & time: 03/10/17  1743     History   Chief Complaint Chief Complaint  Patient presents with  . Cough    HPI LIBRA GATZ is a 46 y.o. female.  HPI   Patient is a 46 year old female presenting with viral-like symptoms.  Patient had flulike symptoms for the last 4 days.  Patient feels a little bit better but not well enough to go to work.  Patient is eating and drinking normally.  Just feels fatigued, body aches.  Past Medical History:  Diagnosis Date  . Anxiety   . Breast cancer ([place]) 2016   R LIQ IDC; triple negative  . Hx of radiation therapy 06/27/15- 08/08/15   Right Breast    Patient Active Problem List   Diagnosis Date Noted  . 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 Chemotherapy-induced peripheral neuropathy ([place]) 02/13/2015  . Genetic testing 09/30/2014  . Breast cancer of lower-inner quadrant of right female breast ([place]) 09/02/2014    Past Surgical History:  Procedure Laterality Date  . BREAST LUMPECTOMY WITH RADIOACTIVE SEED AND AXILLARY LYMPH NODE DISSECTION Right 05/01/2015   Procedure: RIGHT BREAST LUMPECTOMY WITH RADIOACTIVE SEED AND RIGHT AXILLARY LYMPH NODE DISSECTION;  Surgeon: [name], MD;  Location: [place];  Service: General;  Laterality: Right;  . CHOLECYSTECTOMY    . PORT-A-CATH REMOVAL Left 05/01/2015   Procedure: REMOVAL PORT-A-CATH;  Surgeon: [name], MD;  Location: [place];  Service: General;  Laterality: Left;  . PORTACATH PLACEMENT Left 09/16/2014   Procedure: INSERTION PORT-A-CATH WITH ULTRA SOUND ;  Surgeon: [name], MD;  Location: [place];  Service: General;  Laterality: Left;  . TUBAL LIGATION      OB History    No data available       Home Medications    Prior to Admission medications   Medication Sig Start Date End Date Taking? Authorizing Provider    HYDROcodone-acetaminophen (NORCO/VICODIN) 5-325 MG per tablet Take 1-2 tablets by mouth every 6 (six) hours as needed. Patient not taking: Reported on 07/17/2015 10/31/14   Nicholas Lose, MD  LORazepam (ATIVAN) 0.5 MG tablet Take 1 tablet (0.5 mg total) by mouth at bedtime. Patient not taking: Reported on 05/19/2015 09/09/14   Nicholas Lose, MD  ondansetron (ZOFRAN) 8 MG tablet Take 1 tablet (8 mg total) by mouth 2 (two) times daily as needed. Start on the third day after chemotherapy. Patient not taking: Reported on 05/19/2015 09/09/14   Nicholas Lose, MD  prochlorperazine (COMPAZINE) 10 MG tablet Take 1 tablet (10 mg total) by mouth every 6 (six) hours as needed (Nausea or vomiting). Patient not taking: Reported on 05/19/2015 09/09/14   Nicholas Lose, MD    Family History Family History  Problem Relation Age of Onset  . Breast cancer Paternal Grandmother 1  . Diabetes Father   . Hypertension Father   . Other Sister        has had a hysterectomy due to abnormal bleeding  . Other Sister        has had a hysterectomy due to abnormal cell findings - non-cancerous  . Diabetes Paternal Aunt   . Hypertension Paternal Aunt   . Stroke Paternal Aunt   . Heart attack Paternal Uncle   . Cancer Paternal Uncle        unknown type    Social History Social History   Tobacco Use  .  Smoking status: Never Smoker  . Smokeless tobacco: Never Used  Substance Use Topics  . Alcohol use: Yes    Comment: occ/very rarely  . Drug use: No     Allergies   Patient has no known allergies.   Review of Systems Review of Systems  Constitutional: Positive for fatigue and fever. Negative for activity change.  HENT: Positive for congestion.   Respiratory: Positive for cough. Negative for shortness of breath.   Cardiovascular: Negative for chest pain.  Gastrointestinal: Negative for abdominal pain.  All other systems reviewed and are negative.    Physical Exam Updated Vital Signs BP 112/82 (BP Location:  Left Arm)   Pulse (!) 106   Temp 99 F (37.2 C) (Oral)   Resp 18   Ht 5\' 7"  (1.702 m)   Wt 83.9 kg (185 lb)   SpO2 97%   BMI 28.98 kg/m   Physical Exam  Constitutional: She is oriented to person, place, and time. She appears well-developed and well-nourished.  HENT:  Head: Normocephalic and atraumatic.  Mild erythema posterior pharynx  Eyes: Right eye exhibits no discharge. Left eye exhibits no discharge.  Cardiovascular: Normal rate, regular rhythm and normal heart sounds.  No murmur heard. Pulmonary/Chest: Effort normal and breath sounds normal. She has no wheezes. She has no rales.  Abdominal: There is no tenderness.  Neurological: She is oriented to person, place, and time.  Skin: Skin is warm and dry. She is not diaphoretic.  Psychiatric: She has a normal mood and affect.  Nursing note and vitals reviewed.    ED Treatments / Results  Labs (all labs ordered are listed, but only abnormal results are displayed) Labs Reviewed - No data to display  EKG  EKG Interpretation None       Radiology Dg Chest 2 View  Result Date: 03/10/2017 CLINICAL DATA:  Cough for 4 days. Chest pain. History of breast cancer with radiation therapy and chemotherapy. EXAM: CHEST  2 VIEW COMPARISON:  06/27/2015 FINDINGS: Heart size and pulmonary vascularity are normal. No airspace disease or consolidation in the lungs. No blunting of costophrenic angles. No pneumothorax. Surgical clips in the right axilla and in the right upper quadrant. Mediastinal contours appear intact. IMPRESSION: No active cardiopulmonary disease. Electronically Signed   By: Lucienne Capers M.D.   On: 03/10/2017 19:40    Procedures Procedures (including critical care time)  Medications Ordered in ED Medications - No data to display   Initial Impression / Assessment and Plan / ED Course  I have reviewed the triage vital signs and the nursing notes.  Pertinent labs & imaging results that were available during my  care of the patient were reviewed by me and considered in my medical decision making (see chart for details).     Patient is a 46 year old female presenting with viral-like symptoms.  Patient had flulike symptoms for the last 4 days.  Patient feels a little bit better but not well enough to go to work.  Patient is eating and drinking normally.  Just feels fatigued, body aches.  8:46 PM Think is likely represents flulike illness.  We will have her use ibuprofen and Tylenol.  No need for Tamiflu given duration of symptoms.  Will have her rest and drink plenty fluids return precautions expressed.  Final Clinical Impressions(s) / ED Diagnoses   Final diagnoses:  Viral URI with cough    ED Discharge Orders    None       Oluwatosin Higginson, Fredia Sorrow, MD 03/10/17  2046  

## 2017-03-10 NOTE — ED Triage Notes (Signed)
Non productive cough with chest soreness.

## 2017-03-10 NOTE — Discharge Instructions (Signed)
Please use ibuprofen and Tylenol to help with your symptoms.  We think you likely have a flu.  Please be sure to stay hydrated.  Wash your hands and rest.

## 2017-04-15 ENCOUNTER — Ambulatory Visit: Payer: 59 | Admitting: Hematology and Oncology

## 2017-04-15 NOTE — Assessment & Plan Note (Deleted)
Right breast mass 5 x 4.8 x 4.2 cm, 6 enlarged axillary lymph nodes including nodes in level III location, T2 N3 M0 stage IIIc clinical stage Right breast biopsy 6:00: Invasive ductal carcinoma, right axillary lymph node biopsy high-grade carcinoma ER 0% PR 0% HER-2 negative ratio 1.68, Ki-67 80%, grade 3 Neo-adjuvant chemotherapy with dose dense Adriamycin and Cytoxan 4 followed by weekly Abraxane and carboplatin 12 (started 09/19/2014 and completed 02/13/2015) (carboplatin discontinued for thrombocytopenia from cycle 9) Breast MRI 02/20/2015: Complete radiologic response Rt Lumpectomy: 05/01/15: Complete Path response 0/14 LN Adjuvant radiation: 06/27/2015 to05/30/2017  -------------------------------------------------------------------------------------------------------------------------- Breast Cancer Surveillance: 1. Breast exam 04/15/2017: No evidence of recurrence 2. Mammogram:  10/17/2016: No evidence of malignancy in either breast.  RTC in one year for surveillance and follow-up with long-term survivorship clinic with Mendel Ryder

## 2017-10-06 ENCOUNTER — Other Ambulatory Visit: Payer: Self-pay | Admitting: Hematology and Oncology

## 2017-10-06 DIAGNOSIS — N644 Mastodynia: Secondary | ICD-10-CM

## 2017-10-06 DIAGNOSIS — Z9889 Other specified postprocedural states: Secondary | ICD-10-CM

## 2017-10-20 ENCOUNTER — Ambulatory Visit
Admission: RE | Admit: 2017-10-20 | Discharge: 2017-10-20 | Disposition: A | Discharge status: Home / Self Care | Payer: BLUE CROSS/BLUE SHIELD | Source: Ambulatory Visit | Attending: Hematology and Oncology | Admitting: Hematology and Oncology

## 2017-10-20 ENCOUNTER — Other Ambulatory Visit: Payer: Self-pay | Admitting: Hematology and Oncology

## 2017-10-20 DIAGNOSIS — Z9889 Other specified postprocedural states: Secondary | ICD-10-CM

## 2017-10-20 DIAGNOSIS — N644 Mastodynia: Secondary | ICD-10-CM

## 2017-10-20 DIAGNOSIS — R599 Enlarged lymph nodes, unspecified: Secondary | ICD-10-CM

## 2017-10-21 ENCOUNTER — Other Ambulatory Visit: Payer: Self-pay | Admitting: Hematology and Oncology

## 2017-10-21 ENCOUNTER — Ambulatory Visit
Admission: RE | Admit: 2017-10-21 | Discharge: 2017-10-21 | Disposition: A | Discharge status: Home / Self Care | Payer: BLUE CROSS/BLUE SHIELD | Source: Ambulatory Visit | Attending: Hematology and Oncology | Admitting: Hematology and Oncology

## 2017-10-21 DIAGNOSIS — R599 Enlarged lymph nodes, unspecified: Secondary | ICD-10-CM

## 2017-10-21 DIAGNOSIS — Z9889 Other specified postprocedural states: Secondary | ICD-10-CM

## 2018-09-24 ENCOUNTER — Other Ambulatory Visit: Payer: Self-pay | Admitting: Hematology and Oncology

## 2018-09-24 DIAGNOSIS — IMO0002 Reserved for concepts with insufficient information to code with codable children: Secondary | ICD-10-CM

## 2018-10-21 ENCOUNTER — Other Ambulatory Visit: Payer: Self-pay | Admitting: Hematology and Oncology

## 2018-10-21 DIAGNOSIS — Z853 Personal history of malignant neoplasm of breast: Secondary | ICD-10-CM

## 2018-10-23 ENCOUNTER — Other Ambulatory Visit: Payer: Self-pay

## 2018-10-23 ENCOUNTER — Ambulatory Visit
Admission: RE | Admit: 2018-10-23 | Discharge: 2018-10-23 | Disposition: A | Discharge status: Home / Self Care | Payer: BLUE CROSS/BLUE SHIELD | Source: Ambulatory Visit | Attending: Hematology and Oncology | Admitting: Hematology and Oncology

## 2018-10-23 DIAGNOSIS — Z853 Personal history of malignant neoplasm of breast: Secondary | ICD-10-CM

## 2019-09-24 ENCOUNTER — Other Ambulatory Visit: Payer: Self-pay | Admitting: Hematology and Oncology

## 2019-09-24 DIAGNOSIS — Z9889 Other specified postprocedural states: Secondary | ICD-10-CM

## 2019-10-25 ENCOUNTER — Other Ambulatory Visit: Payer: Self-pay

## 2019-10-25 ENCOUNTER — Ambulatory Visit
Admission: RE | Admit: 2019-10-25 | Discharge: 2019-10-25 | Disposition: A | Discharge status: Home / Self Care | Payer: BC Managed Care – PPO | Source: Ambulatory Visit | Attending: Hematology and Oncology | Admitting: Hematology and Oncology

## 2019-10-25 DIAGNOSIS — Z9889 Other specified postprocedural states: Secondary | ICD-10-CM

## 2020-03-06 ENCOUNTER — Emergency Department (HOSPITAL_BASED_OUTPATIENT_CLINIC_OR_DEPARTMENT_OTHER)
Admission: EM | Admit: 2020-03-06 | Discharge: 2020-03-06 | Disposition: A | Discharge status: Home / Self Care | Payer: BC Managed Care – PPO | Attending: Emergency Medicine | Admitting: Emergency Medicine

## 2020-03-06 ENCOUNTER — Other Ambulatory Visit: Payer: Self-pay

## 2020-03-06 ENCOUNTER — Encounter (HOSPITAL_BASED_OUTPATIENT_CLINIC_OR_DEPARTMENT_OTHER): Payer: Self-pay

## 2020-03-06 DIAGNOSIS — Z853 Personal history of malignant neoplasm of breast: Secondary | ICD-10-CM | POA: Diagnosis not present

## 2020-03-06 DIAGNOSIS — R000 Tachycardia, unspecified: Secondary | ICD-10-CM | POA: Diagnosis not present

## 2020-03-06 DIAGNOSIS — U071 COVID-19: Secondary | ICD-10-CM | POA: Diagnosis not present

## 2020-03-06 DIAGNOSIS — J069 Acute upper respiratory infection, unspecified: Secondary | ICD-10-CM

## 2020-03-06 DIAGNOSIS — R059 Cough, unspecified: Secondary | ICD-10-CM | POA: Diagnosis present

## 2020-03-06 LAB — RESP PANEL BY RT-PCR (FLU A&B, COVID) ARPGX2
Influenza A by PCR: NEGATIVE
Influenza B by PCR: NEGATIVE
SARS Coronavirus 2 by RT PCR: POSITIVE — AB

## 2020-03-06 MED ORDER — ACETAMINOPHEN 325 MG PO TABS
650.0000 mg | ORAL_TABLET | Freq: Once | ORAL | Status: AC
  Administered 2020-03-06: 650 mg via ORAL
  Filled 2020-03-06: qty 2

## 2020-03-06 MED ORDER — BENZONATATE 100 MG PO CAPS: 100.0000 mg | ORAL_CAPSULE | Freq: Three times a day (TID) | ORAL | 0 refills | Status: DC

## 2020-03-06 NOTE — ED Provider Notes (Signed)
[facility] EMERGENCY DEPARTMENT Provider Note   CSN: [id number] Arrival date & time: 03/06/20  1040     History No chief complaint on file.   Brandy Douglas is a 49 y.o. female history of breast cancer currently in remission, anxiety.  Patient arrives today with 2-day history of subjective fever, chills, facial congestion, sore throat and nonproductive cough.  She reports that she has not measured a temperature at home but feels warm.  She reports feeling stuffy nose and facial congestion which are mild sensations.  She reports sore throat is a mild bilateral scratchy sensation worsened with swallowing improved with rest.  She has a nonproductive cough without associated chest pain or shortness of breath.  She has been treating her symptoms intermittently with NyQuil with some relief.  She reports she is vaccinated against Covid x2 with her second dose in June 2021.  Denies headache, vision changes, neck stiffness,, difficulty swallowing, voice change, drooling, chest pain, shortness of breath, hemoptysis, abdominal pain, vomiting, diarrhea, to be swelling/color change or any additional concerns.  HPI     Past Medical History:  Diagnosis Date  . Anxiety   . Breast cancer (HCC) 2016   R LIQ IDC; triple negative  . Hx of radiation therapy 06/27/15- 08/08/15   Right Breast    Patient Active Problem List   Diagnosis Date Noted  . 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 Chemotherapy-induced peripheral neuropathy (HCC) 02/13/2015  . Genetic testing 09/30/2014  . Breast cancer of lower-inner quadrant of right female breast (HCC) 09/02/2014    Past Surgical History:  Procedure Laterality Date  . BREAST LUMPECTOMY Right 2017  . BREAST LUMPECTOMY WITH RADIOACTIVE SEED AND AXILLARY LYMPH NODE DISSECTION Right 05/01/2015   Procedure: RIGHT BREAST LUMPECTOMY WITH RADIOACTIVE SEED AND RIGHT AXILLARY LYMPH NODE DISSECTION;  Surgeon: Ovidio Kin, MD;   Location: [facility];  Service: General;  Laterality: Right;  . CHOLECYSTECTOMY    . PORT-A-CATH REMOVAL Left 05/01/2015   Procedure: REMOVAL PORT-A-CATH;  Surgeon: Ovidio Kin, MD;  Location: [facility];  Service: General;  Laterality: Left;  . PORTACATH PLACEMENT Left 09/16/2014   Procedure: INSERTION PORT-A-CATH WITH ULTRA SOUND ;  Surgeon: Ovidio Kin, MD;  Location: [facility];  Service: General;  Laterality: Left;  . TUBAL LIGATION       OB History   No obstetric history on file.     Family History  Problem Relation Age of Onset  . Breast cancer Paternal Grandmother 34  . Diabetes Father   . Hypertension Father   . Other Sister        has had a hysterectomy due to abnormal bleeding  . Other Sister        has had a hysterectomy due to abnormal cell findings - non-cancerous  . Diabetes Paternal Aunt   . Hypertension Paternal Aunt   . Stroke Paternal Aunt   . Heart attack Paternal Uncle   . Cancer Paternal Uncle        unknown type    Social History   Tobacco Use  . Smoking status: Never Smoker  . Smokeless tobacco: Never Used  Substance Use Topics  . Alcohol use: Yes    Comment: occ/very rarely  . Drug use: No    Home Medications Prior to Admission medications   Medication Sig Start Date End Date Taking? Authorizing Provider  benzonatate (TESSALON) 100 MG capsule Take 1 capsule (100 mg total) by mouth every 8 (  eight) hours. 03/06/20  Yes Nuala Alpha A, PA-C  prochlorperazine (COMPAZINE) 10 MG tablet Take 1 tablet (10 mg total) by mouth every 6 (six) hours as needed (Nausea or vomiting). Patient not taking: Reported on 05/19/2015 09/09/14 03/06/20  Nicholas Lose, MD    Allergies    Patient has no known allergies.  Review of Systems   Review of Systems  Constitutional: Positive for chills and fever.  HENT: Positive for rhinorrhea and sore throat. Negative for drooling, facial swelling, trouble swallowing and voice  change.   Eyes: Negative.  Negative for visual disturbance.  Respiratory: Positive for cough. Negative for shortness of breath.   Cardiovascular: Negative.  Negative for chest pain and leg swelling.  Gastrointestinal: Negative for nausea and vomiting.  Musculoskeletal: Positive for myalgias. Negative for arthralgias.  Neurological: Negative.  Negative for weakness and numbness.     Physical Exam Updated Vital Signs BP 120/81 (BP Location: Left Arm)   Pulse (!) 110   Temp 99.1 F (37.3 C) (Oral)   Resp 18   Ht 5\' 7"  (1.702 m)   Wt 81.6 kg   SpO2 96%   BMI 28.19 kg/m   Physical Exam Constitutional:      General: She is not in acute distress.    Appearance: Normal appearance. She is well-developed. She is not ill-appearing or diaphoretic.  HENT:     Head: Normocephalic and atraumatic.     Jaw: There is normal jaw occlusion.     Right Ear: Tympanic membrane and external ear normal.     Left Ear: Tympanic membrane and external ear normal.     Nose: Rhinorrhea present. Rhinorrhea is clear.     Right Sinus: No maxillary sinus tenderness or frontal sinus tenderness.     Left Sinus: No maxillary sinus tenderness or frontal sinus tenderness.     Mouth/Throat:     Mouth: Mucous membranes are moist.     Pharynx: Oropharynx is clear.     Comments: Mild postnasal drip.   The patient has normal phonation and is in control of secretions. No stridor.  Midline uvula without edema. Soft palate rises symmetrically. No tonsillar erythema, swelling or exudates. Tongue protrusion is normal, floor of mouth is soft. No trismus. No creptius on neck palpation. No gingival erythema or fluctuance noted. Mucus membranes moist. No pallor noted.  Eyes:     General: Vision grossly intact. Gaze aligned appropriately.     Extraocular Movements: Extraocular movements intact.     Conjunctiva/sclera: Conjunctivae normal.     Pupils: Pupils are equal, round, and reactive to light.  Neck:     Trachea: Trachea  and phonation normal. No tracheal tenderness or tracheal deviation.     Meningeal: Brudzinski's sign absent.  Cardiovascular:     Rate and Rhythm: Regular rhythm. Tachycardia present.  Pulmonary:     Effort: Pulmonary effort is normal. No accessory muscle usage or respiratory distress.     Breath sounds: Normal breath sounds and air entry.  Abdominal:     General: There is no distension.     Palpations: Abdomen is soft.     Tenderness: There is no abdominal tenderness. There is no guarding or rebound.  Musculoskeletal:        General: Normal range of motion.     Cervical back: Normal range of motion and neck supple. No edema, rigidity or crepitus.     Right lower leg: No edema.     Left lower leg: No edema.  Skin:  General: Skin is warm and dry.  Neurological:     Mental Status: She is alert.     GCS: GCS eye subscore is 4. GCS verbal subscore is 5. GCS motor subscore is 6.     Comments: Speech is clear and goal oriented, follows commands Major Cranial nerves without deficit, no facial droop Moves extremities without ataxia, coordination intact  Psychiatric:        Behavior: Behavior normal.     ED Results / Procedures / Treatments   Labs (all labs ordered are listed, but only abnormal results are displayed) Labs Reviewed  RESP PANEL BY RT-PCR (FLU A&B, COVID) ARPGX2 - Abnormal; Notable for the following components:      Result Value   SARS Coronavirus 2 by RT PCR POSITIVE (*)    All other components within normal limits    EKG None  Radiology No results found.  Procedures Procedures (including critical care time)  Medications Ordered in ED Medications  acetaminophen (TYLENOL) tablet 650 mg (650 mg Oral Given 03/06/20 1720)    ED Course  I have reviewed the triage vital signs and the nursing notes.  Pertinent labs & imaging results that were available during my care of the patient were reviewed by me and considered in my medical decision making (see chart for  details).    MDM Rules/Calculators/A&P                         Additional history obtained from: 1. Nursing notes from this visit.  Alliana Zariel Capano was evaluated in Emergency Department on 03/06/2020 for the symptoms described in the history of present illness. She was evaluated in the context of the global COVID-19 pandemic, which necessitated consideration that the patient might be at risk for infection with the SARS-CoV-2 virus that causes COVID-19. Institutional protocols and algorithms that pertain to the evaluation of patients at risk for COVID-19 are in a state of rapid change based on information released by regulatory bodies including the CDC and federal and state organizations. These policies and algorithms were followed during the patient's care in the ED.  49 year old female with history as above presented with 2-day history of viral URI symptoms.  On exam she is well-appearing no acute distress.  Cranial nerves intact, no meningeal signs.  Airway clear without evidence of PTA, RPA, Ludewig's, dental abscess, sinusitis or other deep space infections of the head or neck.  Cardiopulmonary examination reveals mild tachycardia otherwise unremarkable.  Abdomen soft nontender.  Neurovascular intact all 4 extremities without evidence of DVT.  Covid test in triage was positive.  Covid viral infection accounts for patient's symptoms today, she is not experiencing any chest pain or shortness of breath.  Suspect her mild tachycardia in triage secondary to viral illness, she is not been taking any antipyretics or other medications today will give Tylenol in the ER.  Low suspicion for pulmonary embolism, bacterial pneumonia or other emergent pathologies at this time.  There is no indication for blood work or imaging, will discharge patient with PCP follow-up.  Tessalon prescribed for cough.  I reached out to monoclonal antibody infusion center and gave them patient information.  Patient aware to answer  her phone if they call to schedule an appointment.  Spoke with Basten NP at infusion center.  Encourage patient to maintain water hydration get plenty of rest.  Social distance/mask usage.  Also discussed OTC anti-inflammatories  At this time there does not appear to be  any evidence of an acute emergency medical condition and the patient appears stable for discharge with appropriate outpatient follow up. Diagnosis was discussed with patient who verbalizes understanding of care plan and is agreeable to discharge. I have discussed return precautions with patient who verbalizes understanding. Patient encouraged to follow-up with their PCP. All questions answered.  Patient's case discussed with Dr. Langston Masker who agrees with plan to discharge with follow-up.   Note: Portions of this report may have been transcribed using voice recognition software. Every effort was made to ensure accuracy; however, inadvertent computerized transcription errors may still be present.  Final Clinical Impression(s) / ED Diagnoses Final diagnoses:  [id number] virus infection  Viral URI with cough    Rx / DC Orders ED Discharge Orders         Ordered    benzonatate (TESSALON) 100 MG capsule  Every 8 hours        03/06/20 1749           Gari Crown 03/06/20 1753    Wyvonnia Dusky, MD 03/07/20 [phone]

## 2020-03-06 NOTE — ED Triage Notes (Signed)
Pt reports chill, headaches, subjective fever, body aches, sore throat and cough since 12/25.

## 2020-03-06 NOTE — Discharge Instructions (Addendum)
At this time there does not appear to be the presence of an emergent medical condition, however there is always the potential for conditions to change. Please read and follow the below instructions.  Please return to the Emergency Department immediately for any new or worsening symptoms. Please be sure to follow up with your Primary Care Provider within one week regarding your visit today; please call their office to schedule an appointment even if you are feeling better for a follow-up visit. Please treat plenty water to avoid dehydration and get plenty of rest.  You may use the medication Tessalon as prescribed to help with your cough.  Please social distance, wear mask and avoid others to avoid spread of COVID to other people. You may obtain a home pulse oximeter from the drugstore.  If your oxygen level is ever below 90% please return immediately to the emergency department. You have been referred to the monoclonal antibody infusion center, if they have availability and you qualify they may reach out to you to schedule you an appointment.  Go to the nearest Emergency Department immediately if: You have fever or chills You have chest pain or shortness of breath You have very bad or constant: Headache. Ear pain. Pain in your forehead, behind your eyes, and over your cheekbones (sinus pain). Chest pain. You have long-lasting (chronic) lung disease along with any of these: Wheezing. Long-lasting cough. Coughing up blood. A change in your usual mucus. You have a stiff neck. You have changes in your: Vision. Hearing. Thinking. Mood. You have any new/concerning or worsening of symptoms   Please read the additional information packets attached to your discharge summary.  Do not take your medicine if  develop an itchy rash, swelling in your mouth or lips, or difficulty breathing; call 911 and seek immediate emergency medical attention if this occurs.  You may review your lab tests and  imaging results in their entirety on your MyChart account.  Please discuss all results of fully with your primary care provider and other specialist at your follow-up visit.  Note: Portions of this text may have been transcribed using voice recognition software. Every effort was made to ensure accuracy; however, inadvertent computerized transcription errors may still be present.

## 2020-08-05 ENCOUNTER — Emergency Department (HOSPITAL_BASED_OUTPATIENT_CLINIC_OR_DEPARTMENT_OTHER): Payer: BC Managed Care – PPO

## 2020-08-05 ENCOUNTER — Emergency Department (HOSPITAL_BASED_OUTPATIENT_CLINIC_OR_DEPARTMENT_OTHER)
Admission: EM | Admit: 2020-08-05 | Discharge: 2020-08-05 | Disposition: A | Discharge status: Home / Self Care | Payer: BC Managed Care – PPO | Attending: Emergency Medicine | Admitting: Emergency Medicine

## 2020-08-05 ENCOUNTER — Encounter (HOSPITAL_BASED_OUTPATIENT_CLINIC_OR_DEPARTMENT_OTHER): Payer: Self-pay

## 2020-08-05 ENCOUNTER — Other Ambulatory Visit: Payer: Self-pay

## 2020-08-05 DIAGNOSIS — R059 Cough, unspecified: Secondary | ICD-10-CM | POA: Diagnosis present

## 2020-08-05 DIAGNOSIS — J029 Acute pharyngitis, unspecified: Secondary | ICD-10-CM | POA: Diagnosis not present

## 2020-08-05 DIAGNOSIS — H6123 Impacted cerumen, bilateral: Secondary | ICD-10-CM | POA: Insufficient documentation

## 2020-08-05 DIAGNOSIS — Z20822 Contact with and (suspected) exposure to covid-19: Secondary | ICD-10-CM | POA: Diagnosis not present

## 2020-08-05 DIAGNOSIS — Z8616 Personal history of COVID-19: Secondary | ICD-10-CM | POA: Insufficient documentation

## 2020-08-05 DIAGNOSIS — Z853 Personal history of malignant neoplasm of breast: Secondary | ICD-10-CM | POA: Insufficient documentation

## 2020-08-05 NOTE — ED Notes (Signed)
ED Provider at bedside. 

## 2020-08-05 NOTE — Discharge Instructions (Addendum)
Please consider taking a daily allergy medication to help with your symptoms.  I suggest a less drowsy 24 hour medication such as allegra, zyrtec or Claritin or the generic version.    In addition please use a nasal steroid spray such as Flonase or Nasacort.  Please follow your covid results in mychart

## 2020-08-05 NOTE — ED Provider Notes (Signed)
[facility] EMERGENCY DEPARTMENT Provider Note   CSN: [id number] Arrival date & time: 08/05/20  1133     History Chief Complaint  Patient presents with  . Cough    Chest Congestion    Brandy Douglas is a 50 y.o. female with past medical history of breast cancer in remission, who presents today for evaluation of 2-3 nights of rattling in her chest. She states that she had it get worse last night. She states that she feels fine during the day, he has some coughing and sore throat.  She denies any ear pain.  No fevers.  She did have COVID earlier in the year and is vaccinated. She denies any known sick contacts.  No chest pain/discomfort.  She states that she does occasionally feel mildly short of breath however this improves after coughing.  Currently she does not have any shortness of breath.  She denies any leg swelling.  She did try taking allergy medicines with mild relief.  HPI     Past Medical History:  Diagnosis Date  . Anxiety   . Breast cancer ([place]) 2016   R LIQ IDC; triple negative  . Hx of radiation therapy 06/27/15- 08/08/15   Right Breast    Patient Active Problem List   Diagnosis Date Noted  . 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 Chemotherapy-induced peripheral neuropathy ([place]) 02/13/2015  . Genetic testing 09/30/2014  . Breast cancer of lower-inner quadrant of right female breast ([place]) 09/02/2014    Past Surgical History:  Procedure Laterality Date  . BREAST LUMPECTOMY Right 2017  . BREAST LUMPECTOMY WITH RADIOACTIVE SEED AND AXILLARY LYMPH NODE DISSECTION Right 05/01/2015   Procedure: RIGHT BREAST LUMPECTOMY WITH RADIOACTIVE SEED AND RIGHT AXILLARY LYMPH NODE DISSECTION;  Surgeon: [name], MD;  Location: [place];  Service: General;  Laterality: Right;  . CHOLECYSTECTOMY    . PORT-A-CATH REMOVAL Left 05/01/2015   Procedure: REMOVAL PORT-A-CATH;  Surgeon: [name], MD;  Location: [place];  Service: General;  Laterality: Left;  . PORTACATH PLACEMENT Left 09/16/2014   Procedure: INSERTION PORT-A-CATH WITH ULTRA SOUND ;  Surgeon: [name], MD;  Location: [place];  Service: General;  Laterality: Left;  . TUBAL LIGATION       OB History   No obstetric history on file.     Family History  Problem Relation Age of Onset  . Breast cancer Paternal Grandmother 58  . Diabetes Father   . Hypertension Father   . Other Sister        has had a hysterectomy due to abnormal bleeding  . Other Sister        has had a hysterectomy due to abnormal cell findings - non-cancerous  . Diabetes Paternal Aunt   . Hypertension Paternal Aunt   . Stroke Paternal Aunt   . Heart attack Paternal Uncle   . Cancer Paternal Uncle        unknown type    Social History   Tobacco Use  . Smoking status: Never Smoker  . Smokeless tobacco: Never Used  Substance Use Topics  . Alcohol use: Yes    Comment: occ/very rarely  . Drug use: No    Home Medications Prior to Admission medications   Medication Sig Start Date End Date Taking? Authorizing Provider  benzonatate (TESSALON) 100 MG capsule Take 1 capsule (100 mg total) by mouth every 8 (eight) hours. 03/06/20   Nuala Alpha A, PA-C  prochlorperazine (COMPAZINE)  10 MG tablet Take 1 tablet (10 mg total) by mouth every 6 (six) hours as needed (Nausea or vomiting). Patient not taking: Reported on 05/19/2015 09/09/14 03/06/20  Nicholas Lose, MD    Allergies    Patient has no known allergies.  Review of Systems   Review of Systems  Constitutional: Negative for chills and fever.  HENT: Positive for postnasal drip and sore throat. Negative for congestion.   Eyes: Negative for visual disturbance.  Respiratory: Positive for cough. Negative for wheezing.   Cardiovascular: Negative for chest pain, palpitations and leg swelling.  Gastrointestinal: Negative for abdominal pain.  Musculoskeletal: Negative for back pain.   Neurological: Negative for weakness and headaches.  All other systems reviewed and are negative.   Physical Exam Updated Vital Signs BP (!) 120/93 (BP Location: Right Arm)   Pulse 96   Temp 98.5 F (36.9 C) (Oral)   Resp 20   Ht 5\' 7"  (1.702 m)   Wt 81.6 kg   SpO2 99%   BMI 28.19 kg/m   Physical Exam Vitals and nursing note reviewed.  Constitutional:      General: She is not in acute distress.    Appearance: She is not ill-appearing.  HENT:     Head: Normocephalic and atraumatic.     Ears:     Comments: Bilateral TMs occluded by cerumen.    Mouth/Throat:     Mouth: Mucous membranes are moist.     Pharynx: Oropharynx is clear. No oropharyngeal exudate or posterior oropharyngeal erythema.  Eyes:     Conjunctiva/sclera: Conjunctivae normal.  Cardiovascular:     Rate and Rhythm: Normal rate and regular rhythm.     Heart sounds: No murmur heard.   Pulmonary:     Effort: Pulmonary effort is normal. No respiratory distress.     Breath sounds: Normal breath sounds. No stridor. No wheezing.  Abdominal:     General: There is no distension.  Musculoskeletal:     Cervical back: Normal range of motion and neck supple.     Comments: No obvious acute injury  Skin:    General: Skin is warm.  Neurological:     Mental Status: She is alert.     Comments: Awake and alert, answers all questions appropriately.  Speech is not slurred.  Psychiatric:        Mood and Affect: Mood normal.        Behavior: Behavior normal.     ED Results / Procedures / Treatments   Labs (all labs ordered are listed, but only abnormal results are displayed) Labs Reviewed  SARS CORONAVIRUS 2 (TAT 6-24 HRS)    EKG None  Radiology DG Chest Portable 1 View  Result Date: 08/05/2020 CLINICAL DATA:  Cough EXAM: PORTABLE CHEST 1 VIEW COMPARISON:  March 10, 2017 FINDINGS: The cardiomediastinal silhouette is unchanged in contour.RIGHT axillary surgical clips. No pleural effusion. No pneumothorax. No  acute pleuroparenchymal abnormality. Surgical clips project over the RIGHT upper quadrant. Multilevel degenerative changes of the thoracic spine. IMPRESSION: No acute cardiopulmonary abnormality. Electronically Signed   By: Valentino Saxon MD   On: 08/05/2020 12:48    Procedures Procedures   Medications Ordered in ED Medications - No data to display  ED Course  I have reviewed the triage vital signs and the nursing notes.  Pertinent labs & imaging results that were available during my care of the patient were reviewed by me and considered in my medical decision making (see chart for details).  MDM Rules/Calculators/A&P                         Patient is a 50 year old woman who presents today for evaluation of 2-3 nights of worsening cough and sore throat.  This happens only at night. Currently she does not have any chest pain or shortness of breath.  She has no history of asthma.  Lungs are clear to auscultation bilaterally.  Chest x-ray without acute cardiopulmonary abnormality. She is afebrile, not tachycardic or tachypneic and 100% on room air. I suspect that her symptoms are caused by sinus drainage and postnasal drip exacerbated by laying down at night. Recommended conservative care including continuing antihistamines, nasal corticosteroid sprays, warm showers prior to bed. Given the global pandemic will send COVID test.  Return precautions were discussed with patient who states their understanding.  At the time of discharge patient denied any unaddressed complaints or concerns.  Patient is agreeable for discharge home. PCP follow up.  Discussed elevated diastolic pressure and need for recheck in the next month.   Note: Portions of this report may have been transcribed using voice recognition software. Every effort was made to ensure accuracy; however, inadvertent computerized transcription errors may be present  Stutsman was evaluated in Emergency Department on  08/05/2020 for the symptoms described in the history of present illness. She was evaluated in the context of the global COVID-19 pandemic, which necessitated consideration that the patient might be at risk for infection with the SARS-CoV-2 virus that causes COVID-19. Institutional protocols and algorithms that pertain to the evaluation of patients at risk for COVID-19 are in a state of rapid change based on information released by regulatory bodies including the CDC and federal and state organizations. These policies and algorithms were followed during the patient's care in the ED.   Final Clinical Impression(s) / ED Diagnoses Final diagnoses:  Cough    Rx / DC Orders ED Discharge Orders    None       Lorin Glass, PA-C 08/05/20 1722    Malvin Johns, MD 08/06/20 [phone]

## 2020-08-05 NOTE — ED Triage Notes (Signed)
Pt complains of "rattling in her chest" and coughing/wheezing. Denies shortness of breath/ chest pain. RT to assess in triage.

## 2020-08-06 LAB — SARS CORONAVIRUS 2 (TAT 6-24 HRS): SARS Coronavirus 2: NEGATIVE

## 2020-10-19 ENCOUNTER — Other Ambulatory Visit: Payer: Self-pay | Admitting: Family Medicine

## 2020-10-19 DIAGNOSIS — Z1231 Encounter for screening mammogram for malignant neoplasm of breast: Secondary | ICD-10-CM

## 2020-10-26 ENCOUNTER — Ambulatory Visit
Admission: RE | Admit: 2020-10-26 | Discharge: 2020-10-26 | Disposition: A | Discharge status: Home / Self Care | Payer: BC Managed Care – PPO | Source: Ambulatory Visit | Attending: Family Medicine | Admitting: Family Medicine

## 2020-10-26 ENCOUNTER — Other Ambulatory Visit: Payer: Self-pay

## 2020-10-26 DIAGNOSIS — Z1231 Encounter for screening mammogram for malignant neoplasm of breast: Secondary | ICD-10-CM

## 2020-10-26 HISTORY — DX: Personal history of antineoplastic chemotherapy: Z92.21

## 2020-10-26 HISTORY — DX: Personal history of irradiation: Z92.3

## 2021-08-09 ENCOUNTER — Other Ambulatory Visit: Payer: Self-pay

## 2021-08-09 ENCOUNTER — Emergency Department (HOSPITAL_BASED_OUTPATIENT_CLINIC_OR_DEPARTMENT_OTHER)
Admission: EM | Admit: 2021-08-09 | Discharge: 2021-08-09 | Disposition: A | Discharge status: Home / Self Care | Payer: 59 | Attending: Emergency Medicine | Admitting: Emergency Medicine

## 2021-08-09 ENCOUNTER — Encounter (HOSPITAL_BASED_OUTPATIENT_CLINIC_OR_DEPARTMENT_OTHER): Payer: Self-pay | Admitting: Urology

## 2021-08-09 DIAGNOSIS — H6693 Otitis media, unspecified, bilateral: Secondary | ICD-10-CM | POA: Diagnosis not present

## 2021-08-09 DIAGNOSIS — H608X3 Other otitis externa, bilateral: Secondary | ICD-10-CM | POA: Insufficient documentation

## 2021-08-09 DIAGNOSIS — H9203 Otalgia, bilateral: Secondary | ICD-10-CM | POA: Diagnosis present

## 2021-08-09 DIAGNOSIS — J069 Acute upper respiratory infection, unspecified: Secondary | ICD-10-CM | POA: Insufficient documentation

## 2021-08-09 MED ORDER — AZITHROMYCIN 250 MG PO TABS: ORAL_TABLET | ORAL | 0 refills | Status: AC

## 2021-08-09 MED ORDER — CIPROFLOXACIN-DEXAMETHASONE 0.3-0.1 % OT SUSP: 4.0000 [drp] | Freq: Two times a day (BID) | OTIC | 0 refills | Status: AC

## 2021-08-09 NOTE — ED Notes (Signed)
Discharge instructions reviewed with patient. Patient verbalizes understanding, no further questions at this time. Medications/prescriptions and follow up information provided. No acute distress noted at time of departure.  

## 2021-08-09 NOTE — ED Triage Notes (Signed)
Right ear burning per pt x 1 week  States drainage but is clogged up

## 2021-08-09 NOTE — Discharge Instructions (Addendum)
2 prescriptions have been called in your pharmacy, they are as follows: Z-Pak-an antibiotic, take as directed. Ciprofloxacin/dexamethasone solution-eardrops, please 3 to 4 drops per ear, every 12 hours for the next 7 to 10 days.  Follow-up with your PCP within the next 3 to 4 days for reevaluation and continued medical management.  If symptoms remain by the time of follow-up, PCP may consider ENT referral.  We also continue to manage the pain with Tylenol and ibuprofen.  Return to the ED for new or worsening symptoms as discussed.

## 2021-08-09 NOTE — ED Provider Notes (Signed)
[facility] EMERGENCY DEPARTMENT Provider Note   CSN: [id number] Arrival date & time: 08/09/21  1427     History  Chief Complaint  Patient presents with   Otalgia    Brandy Douglas is a 51 y.o. female seen today 1 week of right ear pain and may be beginning in the left.  Had what she believes to be a URI over the last 1-2 weeks.  Managed symptoms conservatively with Theraflu and OTC remedies.  Ear pain has worsened, described as constant and throbbing.  States the ear feels "plugged up".  Tried some OTC debrox with no success.  Denies drainage or swelling, fever, chills, shortness of breath, cough.  Hx of breast cancer in remission.  The history is provided by the patient and medical records.  Otalgia     Home Medications Prior to Admission medications   Medication Sig Start Date End Date Taking? Authorizing Provider  azithromycin (ZITHROMAX Z-PAK) 250 MG tablet 2 po day one, then 1 daily x 4 days 08/09/21  Yes Prince Rome, PA-C  ciprofloxacin-dexamethasone (CIPRODEX) OTIC suspension Place 4 drops into both ears 2 (two) times daily for 7 days. 08/09/21 08/16/21 Yes Prince Rome, PA-C  prochlorperazine (COMPAZINE) 10 MG tablet Take 1 tablet (10 mg total) by mouth every 6 (six) hours as needed (Nausea or vomiting). Patient not taking: Reported on 05/19/2015 09/09/14 03/06/20  Nicholas Lose, MD      Allergies    Patient has no known allergies.    Review of Systems   Review of Systems  HENT:  Positive for ear pain.    Physical Exam Updated Vital Signs BP (!) 142/97 (BP Location: Right Arm)   Pulse 94   Temp 98.7 F (37.1 C) (Oral)   Resp 18   Ht '5\' 7"'$  (1.702 m)   Wt 79.4 kg   SpO2 100%   BMI 27.41 kg/m  Physical Exam Vitals and nursing note reviewed.  Constitutional:      General: She is not in acute distress.    Appearance: She is well-developed. She is not ill-appearing or diaphoretic.  HENT:     Head: Normocephalic and atraumatic.     Right  Ear: Hearing and external ear normal. Swelling (Mild) and tenderness present. No laceration or drainage. There is impacted cerumen. No mastoid tenderness. No hemotympanum.     Left Ear: Hearing and external ear normal. Swelling (Mild) and tenderness present. No laceration or drainage. There is impacted cerumen. No mastoid tenderness. No hemotympanum. Tympanic membrane is bulging.     Nose:     Right Turbinates: Enlarged.     Left Turbinates: Enlarged.     Mouth/Throat:     Lips: Pink.     Pharynx: Oropharynx is clear. Uvula midline. No oropharyngeal exudate or uvula swelling.     Tonsils: No tonsillar exudate or tonsillar abscesses.     Comments: Mild erythema of posterior oropharynx.  Neck very supple.  No cervical adenopathy appreciated.  Uvula without swelling and is midline. Eyes:     Conjunctiva/sclera: Conjunctivae normal.  Cardiovascular:     Rate and Rhythm: Normal rate and regular rhythm.     Heart sounds: No murmur heard. Pulmonary:     Effort: Pulmonary effort is normal. No respiratory distress.     Breath sounds: Normal breath sounds. No rhonchi.  Chest:     Chest wall: No tenderness.  Abdominal:     Palpations: Abdomen is soft.     Tenderness: There is  no abdominal tenderness.  Musculoskeletal:        General: No swelling.     Cervical back: Neck supple.  Skin:    General: Skin is warm and dry.     Capillary Refill: Capillary refill takes less than 2 seconds.  Neurological:     Mental Status: She is alert and oriented to person, place, and time.  Psychiatric:        Mood and Affect: Mood normal.    ED Results / Procedures / Treatments   Labs (all labs ordered are listed, but only abnormal results are displayed) Labs Reviewed - No data to display  EKG None  Radiology No results found.  Procedures Procedures    Medications Ordered in ED Medications - No data to display  ED Course/ Medical Decision Making/ A&P                           Medical  Decision Making Amount and/or Complexity of Data Reviewed External Data Reviewed: notes. Labs: ordered. Decision-making details documented in ED Course. Radiology: ordered and independent interpretation performed. Decision-making details documented in ED Course. ECG/medicine tests: ordered and independent interpretation performed. Decision-making details documented in ED Course.  Risk OTC drugs. Prescription drug management.   51 y.o. female presents to the ED for concern of Otalgia   This involves an extensive number of treatment options, and is a complaint that carries with it a high risk of complications and morbidity.    Past Medical History / Co-morbidities / Social History: Prior Hx of breast cancer, in remission.  Physical Exam: Physical exam performed. The pertinent findings include: Mild erythema swelling and tenderness of the ear canal.  TM is not clearly visualized due to cerumen.  No mastoid tenderness.  ED Course/Disposition: Pt well-appearing on exam.  Patient presents with increasing otalgia over the last week.  Exam consistent with acute otitis media and suggestive of otitis externa.  Neck very supple.  Without fever, dysphagia, or cervical lymphadenopathy.  Likely experiencing some lingering symptoms of a viral upper respiratory infection.  No concern for acute mastoiditis, malignant otitis media, meningitis.  Mild frontal sinus tenderness bilaterally, likely due to congestion.  Remaining ENT exam unremarkable as described above.  No antibiotic use in the last month, but has allergy of amoxicillin.  Plan to treat for both otitis media and otitis externa.  Prescription for azithromycin and ciprofloxacin/dexamethasone drops to pharmacy.  Also recommended continued conservative management of symptoms as well.  Advised follow-up with PCP within the next 2 to 3 days for reevaluation and continued medical management.  If symptoms remain, may consider follow-up with ENT.  Patient in  NAD and good condition at time of discharge.  After consideration of the diagnostic results and the patient's encounter today, I feel that the emergency department workup does not suggest an emergent condition requiring admission or immediate intervention beyond what has been performed at this time.  The patient is safe for discharge and has been instructed to return immediately for worsening symptoms, change in symptoms or any other concerns.  Discussed course of treatment thoroughly with the patient, whom demonstrated understanding.  Patient in agreement and has no further questions.  I discussed this case with my attending physician Dr. Alvino Chapel, who agreed with the proposed treatment course and cosigned this note including patient's presenting symptoms, physical exam, and planned diagnostics and interventions.  Attending physician stated agreement with plan or made changes to plan which were  implemented.     This chart was dictated using voice recognition software.  Despite best efforts to proofread, errors can occur which can change the documentation meaning.         Final Clinical Impression(s) / ED Diagnoses Final diagnoses:  Other otitis externa, bilateral  Acute otitis media, bilateral  Viral upper respiratory tract infection    Rx / DC Orders ED Discharge Orders          Ordered    azithromycin (ZITHROMAX Z-PAK) 250 MG tablet        08/09/21 1621    ciprofloxacin-dexamethasone (CIPRODEX) OTIC suspension  2 times daily        08/09/21 1621              Prince Rome, Hershal Coria [birth year] 1633    Davonna Belling, MD 08/10/21 0004

## 2021-10-19 ENCOUNTER — Other Ambulatory Visit: Payer: Self-pay | Admitting: Family Medicine

## 2021-10-19 DIAGNOSIS — Z1231 Encounter for screening mammogram for malignant neoplasm of breast: Secondary | ICD-10-CM

## 2021-11-06 ENCOUNTER — Ambulatory Visit: Payer: 59

## 2021-11-23 ENCOUNTER — Ambulatory Visit
Admission: RE | Admit: 2021-11-23 | Discharge: 2021-11-23 | Disposition: A | Discharge status: Home / Self Care | Payer: 59 | Source: Ambulatory Visit | Attending: Family Medicine | Admitting: Family Medicine

## 2021-11-23 DIAGNOSIS — Z1231 Encounter for screening mammogram for malignant neoplasm of breast: Secondary | ICD-10-CM

## 2022-10-23 ENCOUNTER — Other Ambulatory Visit: Payer: Self-pay | Admitting: Family Medicine

## 2022-10-23 DIAGNOSIS — Z1231 Encounter for screening mammogram for malignant neoplasm of breast: Secondary | ICD-10-CM

## 2022-11-25 ENCOUNTER — Ambulatory Visit
Admission: RE | Admit: 2022-11-25 | Discharge: 2022-11-25 | Disposition: A | Discharge status: Home / Self Care | Payer: BLUE CROSS/BLUE SHIELD | Source: Ambulatory Visit | Attending: Family Medicine | Admitting: Family Medicine

## 2022-11-25 DIAGNOSIS — Z1231 Encounter for screening mammogram for malignant neoplasm of breast: Secondary | ICD-10-CM

## 2023-02-04 ENCOUNTER — Encounter (HOSPITAL_BASED_OUTPATIENT_CLINIC_OR_DEPARTMENT_OTHER): Payer: Self-pay | Admitting: Urology

## 2023-02-04 ENCOUNTER — Other Ambulatory Visit: Payer: Self-pay

## 2023-02-04 ENCOUNTER — Emergency Department (HOSPITAL_BASED_OUTPATIENT_CLINIC_OR_DEPARTMENT_OTHER)
Admission: EM | Admit: 2023-02-04 | Discharge: 2023-02-04 | Disposition: A | Discharge status: Home / Self Care | Payer: BLUE CROSS/BLUE SHIELD | Attending: Emergency Medicine | Admitting: Emergency Medicine

## 2023-02-04 DIAGNOSIS — G5602 Carpal tunnel syndrome, left upper limb: Secondary | ICD-10-CM | POA: Diagnosis not present

## 2023-02-04 DIAGNOSIS — R202 Paresthesia of skin: Secondary | ICD-10-CM | POA: Diagnosis present

## 2023-02-04 MED ORDER — PREDNISONE 10 MG (21) PO TBPK: ORAL_TABLET | Freq: Every day | ORAL | 0 refills | Status: AC

## 2023-02-04 NOTE — Discharge Instructions (Addendum)
You likely have carpal tunnel syndrome.  Steroid sent into your pharmacy.  Follow-up with your orthopedist.  For any concerning symptoms return to the emergency room.

## 2023-02-04 NOTE — ED Triage Notes (Signed)
Arm tingling and numbness to left arm x 3 weeks  Radiating from hand up to upper arm    H/o carpel tunnel to left hand sx and Arthritis

## 2023-02-04 NOTE — ED Provider Notes (Signed)
[facility]GENC[facility] DEPARTMENT AT [facility] Provider Note   CSN: [id number] Arrival date & time: 02/04/23  1521     History  Chief Complaint  Patient presents with   Arm Numbness     Brandy Douglas is a 52 y.o. female.  52 year old right hand dominant female presents today for concern of paresthesias to the left hand.  Ongoing for the past 3 weeks.  Somewhat worsening.  Does have history of carpal tunnel syndrome.  Reports the numbness and tingling is mostly in the thumb, index finger, and middle finger.  No weakness.  No other complaints.  Has not tried anything prior to arrival.  The history is provided by the patient. No language interpreter was used.       Home Medications Prior to Admission medications   Medication Sig Start Date End Date Taking? Authorizing Provider  predniSONE (STERAPRED UNI-PAK 21 TAB) 10 MG (21) TBPK tablet Take by mouth daily. Take 6 tabs by mouth daily  for 2 days, then 5 tabs for 2 days, then 4 tabs for 2 days, then 3 tabs for 2 days, 2 tabs for 2 days, then 1 tab by mouth daily for 2 days 02/04/23  Yes Karie Mainland, Zachry Hopfensperger, PA-C  azithromycin (ZITHROMAX Z-PAK) 250 MG tablet 2 po day one, then 1 daily x 4 days 08/09/21   Cecil Cobbs, PA-C  prochlorperazine (COMPAZINE) 10 MG tablet Take 1 tablet (10 mg total) by mouth every 6 (six) hours as needed (Nausea or vomiting). Patient not taking: Reported on 05/19/2015 09/09/14 03/06/20  Serena Croissant, MD      Allergies    Patient has no known allergies.    Review of Systems   Review of Systems  Constitutional:  Negative for fever.  Musculoskeletal:  Positive for arthralgias.  Neurological:  Positive for numbness. Negative for weakness.  All other systems reviewed and are negative.   Physical Exam Updated Vital Signs BP (!) 130/91 (BP Location: Left Arm)   Pulse (!) 101   Temp 99 F (37.2 C)   Resp 18   Ht 5\' 7"  (1.702 m)   Wt 79.4 kg   SpO2 98%   BMI 27.42 kg/m  Physical  Exam Vitals and nursing note reviewed.  Constitutional:      General: She is not in acute distress.    Appearance: Normal appearance. She is not ill-appearing.  HENT:     Head: Normocephalic and atraumatic.     Nose: Nose normal.  Eyes:     Conjunctiva/sclera: Conjunctivae normal.  Cardiovascular:     Rate and Rhythm: Normal rate and regular rhythm.     Comments: Initially noted to be tachycardic but normal rate on my exam. Pulmonary:     Effort: Pulmonary effort is normal. No respiratory distress.  Musculoskeletal:        General: No deformity.     Comments: Neurovascularly intact in the left upper extremity.  Positive Phalen's test.  Good strength.  Soft compartments.  Skin:    Findings: No rash.  Neurological:     General: No focal deficit present.     Mental Status: She is alert and oriented to person, place, and time. Mental status is at baseline.     Cranial Nerves: No cranial nerve deficit.     Sensory: No sensory deficit.     Motor: No weakness.     ED Results / Procedures / Treatments   Labs (all labs ordered are listed, but only abnormal results  are displayed) Labs Reviewed - No data to display  EKG None  Radiology No results found.  Procedures Procedures    Medications Ordered in ED Medications - No data to display  ED Course/ Medical Decision Making/ A&P                                 Medical Decision Making Risk Prescription drug management.   52 year old female presents today for concern of numbness and tingling in left left hand predominantly in the thumb, index, middle finger.  Positive Phalen's test.  Concern for flareup of carpal tunnel.  Patient has history of this as well.  Will provide patient with prednisone Dosepak.  Discussed follow-up with her orthopedist.  Patient voices understanding and is in agreement with plan.  Return precautions discussed.   Final Clinical Impression(s) / ED Diagnoses Final diagnoses:  Carpal tunnel  syndrome of left wrist    Rx / DC Orders ED Discharge Orders          Ordered    predniSONE (STERAPRED UNI-PAK 21 TAB) 10 MG (21) TBPK tablet  Daily        02/04/23 1648              Marita Kansas, PA-C 02/04/23 1656    Long, Arlyss Repress, MD 02/04/23 1818

## 2023-11-04 ENCOUNTER — Other Ambulatory Visit: Payer: Self-pay | Admitting: Physician Assistant

## 2023-11-04 DIAGNOSIS — Z1231 Encounter for screening mammogram for malignant neoplasm of breast: Secondary | ICD-10-CM

## 2023-11-27 ENCOUNTER — Ambulatory Visit

## 2023-12-09 ENCOUNTER — Ambulatory Visit
Admission: RE | Admit: 2023-12-09 | Discharge: 2023-12-09 | Disposition: A | Discharge status: Home / Self Care | Source: Ambulatory Visit | Attending: Physician Assistant | Admitting: Physician Assistant

## 2023-12-09 DIAGNOSIS — Z1231 Encounter for screening mammogram for malignant neoplasm of breast: Secondary | ICD-10-CM

## 2023-12-16 NOTE — Progress Notes (Signed)
 Patient name: Brandy Douglas  Date of birth: [birth year] Date of visit: 12/16/2023 Referring Provider: Self, Referral   Primary Care Provider: Tully Shawnee Gladis Sharl, PA-C                                                Chief Complaint:  Chief Complaint  Patient presents with  . Follow-up    Impression /Plan:    Assessment & Plan 1.  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  Stable - Blood pressure is slightly low, likely due to current medication regimen. - Goal is to improve cardiac function as much as possible given the weakness of the heart pump and young age. - Ejection fraction is notably low at 20 to 25%. - Advised to avoid strenuous activities such as climbing ladders due to shortness of breath. - Referral to the heart failure clinic for further evaluation and management.  Appointment scheduled for January 13, 2024 - Consultation with Dr. Arne to discuss potential adjustments to medication regimen.  No changes at this time.   .  2. Allergies: Known.  Managed by PCP - Permitted to take allergy medications such as Zyrtec, Claritin, or Allegra for symptoms.  5. Medication management: - Advised to take half a tablet of Flexeril prn while lying down at home to manage symptoms. - If it does not cause excessive grogginess or dizziness, may continue its use.  Follow-up: A follow-up visit is scheduled in 3 months.     History Present Illness:   History of Present Illness The patient is a 53 year old female who presents for nonischemic chemotherapy induced cardiomyopathy.  Patient was diagnosed with right right sided breast cancer in 2016.  She was noted to have a murmur by her PCP and ultimately underwent echocardiogram.  She was found to have ejection fraction of 20 to 25% and left ventricular dilatation.  Previous echocardiogram in 2016 showed a EF of 45 to 50%.  She has been diagnosed with cardiomyopathy, characterized by an enlarged heart and a weak  pump, as explained by Dr. Arne. She reports no chest pain, shortness of breath, dizziness, palpitations, or leg swelling. She has been referred to the heart failure clinic and has an appointment scheduled for 01/13/2024. Her blood pressure was recorded as 116 systolic at 7:00 AM before taking her medication. She has been adhering to her prescribed Entresto regimen but reports no change in her condition.She also reports that climbing ladders exacerbates her symptoms of shortness of breath and fatigue.  She recently had neck x-rays and believes she needs to consult Dr. Tobie again due to persistent symptoms.  She experiences arthritis throughout her body, with her legs being the most affected. The severity of her leg pain has increased, which she attributes to working two additional hours. Prolonged sitting makes it difficult for her to stand up, and she struggles to resume work after breaks.  She is currently under the care of an orthopedist.  She is seeking advice on whether she can take allergy medication, as her allergies have been flaring up recently.  She has been avoiding Flexeril due to uncertainty about its compatibility with her other medications, opting for Tylenol  instead, which provides minimal relief.  SOCIAL HISTORY Occupations: Works in engineering geologist at [employer]     Past Medical History:  Medical History[1]   Social History:   Social History  Socioeconomic History  . Marital status: Single    Spouse name: Not on file  . Number of children: Not on file  . Years of education: Not on file  . Highest education level: Not on file  Occupational History  . Not on file  Tobacco Use  . Smoking status: Never    Passive exposure: Past  . Smokeless tobacco: Never  Substance and Sexual Activity  . Alcohol use: Yes    Comment: occ  . Drug use: No  . Sexual activity: Not on file  Other Topics Concern  . Not on file  Social History Narrative  . Not on file   Social  Drivers of Health   Food Insecurity: Low Risk  (10/22/2023)   Food vital sign   . Within the past 12 months, you worried that your food would run out before you got money to buy more: Never true   . Within the past 12 months, the food you bought just didn't last and you didn't have money to get more: Never true  Transportation Needs: No Transportation Needs (10/22/2023)   Transportation   . In the past 12 months, has lack of reliable transportation kept you from medical appointments, meetings, work or from getting things needed for daily living? : No  Safety: Low Risk  (04/03/2023)   Safety   . How often does anyone, including family and friends, physically hurt you?: Never   . How often does anyone, including family and friends, insult or talk down to you?: Never   . How often does anyone, including family and friends, threaten you with harm?: Never   . How often does anyone, including family and friends, scream or curse at you?: Never  Living Situation: Low Risk  (10/22/2023)   Living Situation   . What is your living situation today?: I have a steady place to live   . Think about the place you live. Do you have problems with any of the following? Choose all that apply:: None/None on this list    Family History :  Family History[2]   Current Medications: Current Medications[3]  Allergies:   Allergies[4]   Review Of  Systems:  Negative except multiple joint pain History obtained from chart review and the patient General ROS: negative Hematological and Lymphatic ROS: negative Endocrine ROS: negative Respiratory ROS: no cough, shortness of breath, or wheezing Cardiovascular ROS: no chest pain or dyspnea on exertion Gastrointestinal ROS: no abdominal pain, change in bowel habits, or black or bloody stools Musculoskeletal ROS: Neck and leg pain  Vital Signs:  BP 93/63 Comment: la sitting  Pulse 98   Resp 18   Ht 1.702 m (5' 7.01)   Wt 84.5 kg (186 lb 3.2 oz)   SpO2 100%    BMI 29.16 kg/m  Wt Readings from Last 3 Encounters:  12/16/23 84.5 kg (186 lb 3.2 oz)  11/18/23 83.9 kg (185 lb)  10/23/23 85.3 kg (188 lb)    Physical Examination Constitutional: See Vital Signs                          No Acute Distress Normal Body Habitus and development  Eyes:  Normal conjunctivae Ears Nose and Throat: Normal  Neck: No significant jugular venous distension.  Thyroid  does not appear enlarged visually Respiratory:   No increased work of breathing; CTBA; No wheezes rhonchi or rales  Cardiovascular Exam:   No palpable thrills or lifts, PMI non displaced.  S1 S2   regular rate and  rhythm   No murmurs;  No rubs or gallop.   Normal Carotid upstroke, No Carotid Bruits  No abnormal abdominal pulsation or bruits  Pedal/radial Pulses - present  No significant edema or varicosities   Abdomen  Soft non tender;  non distended, + bowel sounds, Noobvious HSM or masses Ext:  No clubbing or cyanosis  MSK:  Generally normal strength;   Neuro:  Alert and Oriented x 2  Grossly normal non focal, Normal mood and affect Psych:  Normal Mood and affect Skin: No rash      Diagnostic Studies:  Lab Results  Component Value Date   WBC 6.00 04/10/2023   HGB 12.6 04/10/2023   HCT 36.8 04/10/2023   PLT 305 04/10/2023    Lab Results  Component Value Date   NA 139 11/18/2023   K 4.3 11/18/2023   CL 104 11/18/2023   CO2 28 11/18/2023   BUN 10 11/18/2023   CREATININE 0.73 11/18/2023   GLUCOSE 107 (H) 11/18/2023   CALCIUM 9.6 11/18/2023    Lab Results  Component Value Date   BILITOT 0.4 11/18/2023   BILIDIR 0.1 04/10/2023   PROT 7.3 11/18/2023   ALBUMIN 4.1 11/18/2023   ALT 12 11/18/2023   AST 15 11/18/2023   ALP 77 11/18/2023    No results found for: LABPROT, INR, PTT  No results found for: CHOL No results found for: HDL No results found for: LDLCALC, LDLDIRECT No results found for: TRIG No results found for: HDL    Thank you for allowing  me to participate in the care of your patient.  Please feel free to contact me ifyou need any further information.  Samandra Nadine Tann, NP       [1] Past Medical History: Diagnosis Date  . Anemia   . Anxiety   . Arthritis   . Breast cancer    (CMD)    rt. lumpectomy  [2] Family History Problem Relation Name Age of Onset  . COPD Mother Jennise Both   . Diabetes Father Veera Stapleton   . Rickets Maternal Grandmother    [3] Current Outpatient Medications  Medication Sig Dispense Refill  . acetaminophen  (TYLENOL ) 650 mg ER tablet Take 650 mg by mouth every 8 (eight) hours as needed (pain).    . sacubitriL-valsartan (Entresto) 24-26 mg per tablet Indications: chronic heart failure. Entresto 24/26 mg take 1/2 tab by mouth twice daily 30 tablet 3  . amoxicillin-pot clavulanate (AUGMENTIN) 875-125 mg per tablet TAKE 1 TABLET BY MOUTH TWICE DAILY (IN THE MORNING AND BEFORE BEDTIME DO ALL THIS FOR 7 DAYS) (Patient not taking: Reported on 11/18/2023)    . cyclobenzaprine (FLEXERIL) 10 mg tablet Take 1 tablet (10 mg total) by mouth 2 (two) times a day as needed for muscle spasms. (Patient not taking: Reported on 12/16/2023) 90 tablet 1  . doxycycline (VIBRA-TABS) 100 mg tablet TAKE 1 TABLET BY MOUTH TWICE DAILY IN THE MORNING AND BEFORE BEDTIME FOR 10 DAYS. TAKE WITH A FULL GLASS OF WATER AND DO NOT LIE DOWN FOR AT LEAST 30 MINUTES AFTER     No current facility-administered medications for this visit.  [4] No Known Allergies

## 2024-01-10 ENCOUNTER — Emergency Department (HOSPITAL_BASED_OUTPATIENT_CLINIC_OR_DEPARTMENT_OTHER)
Admission: EM | Admit: 2024-01-10 | Discharge: 2024-01-10 | Disposition: A | Discharge status: Home / Self Care | Attending: Emergency Medicine | Admitting: Emergency Medicine

## 2024-01-10 ENCOUNTER — Other Ambulatory Visit: Payer: Self-pay

## 2024-01-10 ENCOUNTER — Emergency Department (HOSPITAL_BASED_OUTPATIENT_CLINIC_OR_DEPARTMENT_OTHER)

## 2024-01-10 ENCOUNTER — Encounter (HOSPITAL_BASED_OUTPATIENT_CLINIC_OR_DEPARTMENT_OTHER): Payer: Self-pay | Admitting: Emergency Medicine

## 2024-01-10 DIAGNOSIS — Z853 Personal history of malignant neoplasm of breast: Secondary | ICD-10-CM | POA: Insufficient documentation

## 2024-01-10 DIAGNOSIS — R0602 Shortness of breath: Secondary | ICD-10-CM | POA: Diagnosis present

## 2024-01-10 DIAGNOSIS — J9801 Acute bronchospasm: Secondary | ICD-10-CM | POA: Insufficient documentation

## 2024-01-10 HISTORY — DX: Pneumonia, unspecified organism: J18.9

## 2024-01-10 LAB — CBC WITH DIFFERENTIAL/PLATELET
Abs Immature Granulocytes: 0.03 K/uL (ref 0.00–0.07)
Basophils Absolute: 0 K/uL (ref 0.0–0.1)
Basophils Relative: 0 %
Eosinophils Absolute: 0.1 K/uL (ref 0.0–0.5)
Eosinophils Relative: 1 %
HCT: 36.4 % (ref 36.0–46.0)
Hemoglobin: 11.9 g/dL — ABNORMAL LOW (ref 12.0–15.0)
Immature Granulocytes: 0 %
Lymphocytes Relative: 34 %
Lymphs Abs: 2.6 K/uL (ref 0.7–4.0)
MCH: 28.9 pg (ref 26.0–34.0)
MCHC: 32.7 g/dL (ref 30.0–36.0)
MCV: 88.3 fL (ref 80.0–100.0)
Monocytes Absolute: 0.6 K/uL (ref 0.1–1.0)
Monocytes Relative: 7 %
Neutro Abs: 4.4 K/uL (ref 1.7–7.7)
Neutrophils Relative %: 58 %
Platelets: 282 K/uL (ref 150–400)
RBC: 4.12 MIL/uL (ref 3.87–5.11)
RDW: 13.5 % (ref 11.5–15.5)
WBC: 7.7 K/uL (ref 4.0–10.5)
nRBC: 0 % (ref 0.0–0.2)

## 2024-01-10 LAB — PRO BRAIN NATRIURETIC PEPTIDE: Pro Brain Natriuretic Peptide: 357 pg/mL — ABNORMAL HIGH (ref <300.0)

## 2024-01-10 LAB — BASIC METABOLIC PANEL WITH GFR
Anion gap: 12 (ref 5–15)
BUN: 9 mg/dL (ref 6–20)
CO2: 24 mmol/L (ref 22–32)
Calcium: 9.3 mg/dL (ref 8.9–10.3)
Chloride: 105 mmol/L (ref 98–111)
Creatinine, Ser: 0.87 mg/dL (ref 0.44–1.00)
GFR, Estimated: >60 mL/min (ref >60)
Glucose, Bld: 107 mg/dL — ABNORMAL HIGH (ref 70–99)
Potassium: 3.7 mmol/L (ref 3.5–5.1)
Sodium: 142 mmol/L (ref 135–145)

## 2024-01-10 LAB — TROPONIN T, HIGH SENSITIVITY: Troponin T High Sensitivity: <15 ng/L (ref 0–19)

## 2024-01-10 MED ORDER — IOHEXOL 350 MG/ML SOLN
75.0000 mL | Freq: Once | INTRAVENOUS | Status: AC | PRN
  Administered 2024-01-10: 75 mL via INTRAVENOUS

## 2024-01-10 MED ORDER — AEROCHAMBER PLUS FLO-VU MEDIUM MISC
1.0000 | Freq: Once | Status: AC
  Administered 2024-01-10: 1

## 2024-01-10 MED ORDER — ALBUTEROL SULFATE HFA 108 (90 BASE) MCG/ACT IN AERS
2.0000 | INHALATION_SPRAY | Freq: Once | RESPIRATORY_TRACT | Status: AC
  Administered 2024-01-10: 2 via RESPIRATORY_TRACT
  Filled 2024-01-10: qty 6.7

## 2024-01-10 NOTE — Discharge Instructions (Addendum)
 Use your inhaler 2 puffs every 4 hours as needed for coughing and wheezing. Also discharging you with an inhaler.  Follow-up with your primary care physician Get help right away if: You have trouble breathing. Your wheezing and coughing do not get better after taking your medicine. You have chest pain. You have trouble speaking more than one-word sentences. These symptoms may be an emergency. Get help right away. Call 911. Do not wait to see if the symptoms will go away. Do not drive yourself to the hospital.

## 2024-01-10 NOTE — ED Notes (Signed)
 RT Sari updated re: inhaler order

## 2024-01-10 NOTE — ED Notes (Signed)

## 2024-01-10 NOTE — ED Notes (Signed)
 Pt ambulated by RT Sari

## 2024-01-10 NOTE — ED Triage Notes (Signed)
 Pt c/o SHOB x 3d; sts feels similar to when she had PNA in August; increases with exertion; has difficulty lying flat

## 2024-01-10 NOTE — ED Provider Notes (Signed)
 [place] [facility] Provider Note   CSN: [id number] Arrival date & time: 01/10/24  1516     Patient presents with: Shortness of Breath   Brandy Douglas is a 53 y.o. female presents with chief complaint of cough and wheezing.  Patient reports she had onset of coughing wheezing starting about 3 days ago.  She reports that in the past she had pneumonia and I just want to make sure it was not that.  Patient reports that she has an inhaler at home but she states that she is unsure if she is using it correctly.  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  She is currently in acknowledging orthopnea but states that it is due to wheezing and coughing at night she has been unable to sleep not due to waking up gasping for air.  Patient also reports that she has exertional dyspnea.  She states that she begins wheezing and this is followed by coughing but she is not bringing anything up out of her lungs and denies fever or chills.She has a past medical history of breast cancer.  History of breast cancer.  She denies peripheral edema swelling in her abdomen or weight gain.  Review of outpatient records shows that she was diagnosed with pneumonia in August 2025 in [place]  at an ER stopped chest x-ray.  Patient was treated with Augmentin and doxycycline and followed up at her PCPs office 5 days later for further evaluation.  X-ray at that time showed widespread bilateral ground glass opacities.  Patient denies a history of smoking.  Review of EMR shows that she was post to have a repeat chest x-ray on 11/27/2023 chest x-ray performed 11/18/2023 showed no lung abnormalities per read    Shortness of Breath      Prior to Admission medications   Medication Sig Start Date End Date Taking? Authorizing Provider  azithromycin  (ZITHROMAX  Z-PAK) 250 MG tablet 2 po day one, then 1 daily x 4 days 08/09/21    Renae Bernarda HERO, PA-C  predniSONE  (STERAPRED UNI-PAK 21 TAB) 10 MG (21) TBPK tablet Take by mouth daily. Take 6 tabs by mouth daily  for 2 days, then 5 tabs for 2 days, then 4 tabs for 2 days, then 3 tabs for 2 days, 2 tabs for 2 days, then 1 tab by mouth daily for 2 days 02/04/23   Hildegard Loge, PA-C  prochlorperazine  (COMPAZINE ) 10 MG tablet Take 1 tablet (10 mg total) by mouth every 6 (six) hours as needed (Nausea or vomiting). Patient not taking: Reported on 05/19/2015 09/09/14 03/06/20  Gudena, Vinay, MD    Allergies: Patient has no known allergies.    Review of Systems  Respiratory:  Positive for shortness of breath.     Updated Vital Signs BP 136/84 (BP Location: Right Arm)   Pulse (!) 113   Temp 98.4 F (36.9 C)   Resp 18   Ht 5' 7 (1.702 m)   Wt 83.9 kg   SpO2 99%   BMI 28.98 kg/m   Physical Exam Vitals and nursing note reviewed.  Constitutional:      General: She is not in acute distress.    Appearance: She is well-developed. She is not diaphoretic.  HENT:     Head: Normocephalic and atraumatic.     Right Ear: External ear normal.     Left Ear: External ear normal.  Nose: Nose normal.     Mouth/Throat:     Mouth: Mucous membranes are moist.  Eyes:     General: No scleral icterus.    Conjunctiva/sclera: Conjunctivae normal.  Cardiovascular:     Rate and Rhythm: Normal rate and regular rhythm.     Heart sounds: Normal heart sounds. No murmur heard.    No friction rub. No gallop.  Pulmonary:     Effort: Pulmonary effort is normal. No respiratory distress.     Breath sounds: Decreased breath sounds present.  Abdominal:     General: Bowel sounds are normal. There is no distension.     Palpations: Abdomen is soft. There is no mass.     Tenderness: There is no abdominal tenderness. There is no guarding.  Musculoskeletal:     Cervical back: Normal range of motion.  Skin:    General: Skin is warm and dry.  Neurological:     Mental Status: She is alert and  oriented to person, place, and time.  Psychiatric:        Behavior: Behavior normal.     (all labs ordered are listed, but only abnormal results are displayed) Labs Reviewed  BASIC METABOLIC PANEL WITH GFR - Abnormal; Notable for the following components:      Result Value   Glucose, Bld 107 (*)    All other components within normal limits  CBC WITH DIFFERENTIAL/PLATELET - Abnormal; Notable for the following components:   Hemoglobin 11.9 (*)    All other components within normal limits  PRO BRAIN NATRIURETIC PEPTIDE - Abnormal; Notable for the following components:   Pro Brain Natriuretic Peptide 357.0 (*)    All other components within normal limits  TROPONIN T, HIGH SENSITIVITY  TROPONIN T, HIGH SENSITIVITY    EKG: None  Radiology: DG Chest 2 View Result Date: 01/10/2024 CLINICAL DATA:  Short of breath for 3 days EXAM: CHEST - 2 VIEW COMPARISON:  08/05/2020 FINDINGS: Frontal and lateral views of the chest demonstrate an unremarkable cardiac silhouette. There is diffuse interstitial prominence, which could reflect developing interstitial edema or atypical viral pneumonitis. No focal consolidation, effusion, or pneumothorax. No acute bony abnormalities. IMPRESSION: 1. Diffuse interstitial prominence throughout the lungs, which may reflect interstitial edema or atypical viral pneumonitis. Electronically Signed   By: Ozell Daring M.D.   On: 01/10/2024 15:59     Procedures   Medications Ordered in the ED - No data to display  Clinical Course as of 01/10/24 1926  Sat Jan 10, 2024  1740 Pulse Rate(!): 113 [AH]  1853 Reevaluated at bedside.  She states she is feeling much better after her inhaler.  Current plan is to ambulate with pulse ox make sure she is not desaturating if she is doing okay I will have her discharged home with her inhaler and steroid [AH]  1854 CT Angio Chest PE W and/or Wo Contrast I visualized and interpreted CT angiogram of the chest which shows no acute  findings.  Initial chest x-ray concerning for diffuse viral infection versus pulmonary edema. [AH]    Clinical Course User Index [AH] Arloa Chroman, PA-C                                 Medical Decision Making Amount and/or Complexity of Data Reviewed Labs: ordered. Radiology: ordered. Decision-making details documented in ED Course.  Risk Prescription drug management.   This patient presents to the ED for concern  of shortness of breath, this involves an extensive number of treatment options, and is a complaint that carries with it a high risk of complications and morbidity.  The emergent differential diagnosis for shortness of breath includes, but is not limited to, Pulmonary edema, bronchoconstriction, Pneumonia, Pulmonary embolism, Pneumotherax/ Hemothorax, Dysrythmia, ACS.    Co morbidities:   has a past medical history of Anxiety, Breast cancer (HCC) (03/11/2014), radiation therapy (06/27/15- 08/08/15), Personal history of chemotherapy, Personal history of radiation therapy, and PNA (pneumonia).   Social Determinants of Health:   SDOH Screenings   Food Insecurity: Low Risk  (10/22/2023)   Received from [facility]  Housing: Low Risk  (10/22/2023)   Received from [facility]  Transportation Needs: No Transportation Needs (10/22/2023)   Received from [facility]  Utilities: Low Risk  (10/22/2023)   Received from [facility]  Social Connections: Unknown (07/24/2021)   Received from [facility]  Tobacco Use: Low Risk  (01/10/2024)     Additional history:  Additional history obtained from review of EMR {External records from outside source obtained and reviewed including oncology PCP and echocardiogram notes at [facility] as well as outpatient ER notes from [place]   Lab Tests:  I Ordered, and personally interpreted labs.  The pertinent results include:   Labs reviewed BNP is mildly elevated but not significant.  BMP with glucose of 107, no elevated white  blood cell count mild anemia negative troponin  Imaging Studies:  I ordered imaging studies including two-view chest x-ray which showed diffuse interstitial prominence and CT angiogram I independently visualized and interpreted imaging which showed no acute findings I agree with the radiologist interpretation  Cardiac Monitoring/ECG:  The patient was maintained on a cardiac monitor.  I personally viewed and interpreted the cardiac monitored which showed an underlying rhythm of: Sinus tachycardia on EKG with right atrial prominence  Medicines ordered and prescription drug management:  I ordered medication including  Medications  albuterol (VENTOLIN HFA) 108 (90 Base) MCG/ACT inhaler 2 puff (2 puffs Inhalation Given 01/10/24 1746)  AeroChamber Plus Flo-Vu Medium MISC 1 each (1 each Other Given 01/10/24 1746)   for shortness of breath Reevaluation of the patient after these medicines showed that the patient improved I have reviewed the patients home medicines and have made adjustments as needed  Test Considered:    Critical Interventions:    Consultations Obtained:   Problem List / ED Course:     ICD-10-CM   1. Bronchospasm  J98.01       MDM: This is a 53 year old female with a history of cardiomyopathy, cancer.  Initial chest x-ray concerning for diffuse interstitial edema.  This was a similar appearance and read to previous x-rays that were were diagnosed as pneumonia.  Given the x-ray I was concerned that potentially we are missing the diagnosis including pulmonary embolus or pulmonary fibrosis or sarcoidosis or underlying infection or edema.  Her CT angiogram is negative for any acute findings.  She felt significantly improved after using her inhaler.  She was able to ambulate in the ER to our ER with oxygen saturations between 97 to 100%.  Will discharge with use of her an inhaler and spacer along with Medrol dose pack and outpatient follow-up with  PCP   Dispostion:  After consideration of the diagnostic results and the patients response to treatment, I feel that the patent would benefit from discharge with strict return precautions.      Final diagnoses:  None    ED Discharge  Orders     None          Arloa Chroman, PA-C 01/10/24 1933    Pamella Ozell LABOR, DO 01/13/24 [phone]

## 2024-01-10 NOTE — Progress Notes (Signed)
 Ambulated patient around the ER. There are no SHOB, the lowest SPO2 was 97% HR 107 RR 18, the highest SPO2 was 99% , HR 128 RR 22 while ambulating at a normal pace. Patient stated she felt better and there were no signs/symptoms of SHOB.
